# Patient Record
Sex: Female | Born: 1985 | Race: Black or African American | Hispanic: No | Marital: Single | Smoking: Former smoker
Health system: Southern US, Community
[De-identification: ages and names within clinical notes are randomized; demographics above are authoritative.]

## PROBLEM LIST (undated history)

## (undated) ENCOUNTER — Inpatient Hospital Stay (HOSPITAL_COMMUNITY): Payer: Self-pay

## (undated) DIAGNOSIS — G43909 Migraine, unspecified, not intractable, without status migrainosus: Secondary | ICD-10-CM

## (undated) DIAGNOSIS — D649 Anemia, unspecified: Secondary | ICD-10-CM

## (undated) DIAGNOSIS — Z8489 Family history of other specified conditions: Secondary | ICD-10-CM

## (undated) DIAGNOSIS — N200 Calculus of kidney: Secondary | ICD-10-CM

## (undated) DIAGNOSIS — O2303 Infections of kidney in pregnancy, third trimester: Secondary | ICD-10-CM

## (undated) DIAGNOSIS — N201 Calculus of ureter: Secondary | ICD-10-CM

## (undated) DIAGNOSIS — J45909 Unspecified asthma, uncomplicated: Secondary | ICD-10-CM

## (undated) DIAGNOSIS — E282 Polycystic ovarian syndrome: Secondary | ICD-10-CM

## (undated) DIAGNOSIS — I8390 Asymptomatic varicose veins of unspecified lower extremity: Secondary | ICD-10-CM

## (undated) DIAGNOSIS — Z87448 Personal history of other diseases of urinary system: Secondary | ICD-10-CM

## (undated) DIAGNOSIS — L659 Nonscarring hair loss, unspecified: Secondary | ICD-10-CM

## (undated) DIAGNOSIS — J302 Other seasonal allergic rhinitis: Secondary | ICD-10-CM

## (undated) DIAGNOSIS — Z87442 Personal history of urinary calculi: Secondary | ICD-10-CM

## (undated) DIAGNOSIS — Z87820 Personal history of traumatic brain injury: Secondary | ICD-10-CM

## (undated) DIAGNOSIS — R51 Headache: Secondary | ICD-10-CM

## (undated) DIAGNOSIS — R0600 Dyspnea, unspecified: Secondary | ICD-10-CM

## (undated) HISTORY — PX: WISDOM TOOTH EXTRACTION: SHX21

## (undated) HISTORY — PX: TONSILLECTOMY: SUR1361

## (undated) HISTORY — DX: Headache: R51

## (undated) HISTORY — DX: Other seasonal allergic rhinitis: J30.2

---
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
## 2004-03-13 ENCOUNTER — Emergency Department (HOSPITAL_COMMUNITY): Admission: EM | Admit: 2004-03-13 | Discharge: 2004-03-13 | Payer: Self-pay | Admitting: Emergency Medicine

## 2004-05-29 ENCOUNTER — Emergency Department (HOSPITAL_COMMUNITY): Admission: EM | Admit: 2004-05-29 | Discharge: 2004-05-29 | Payer: Self-pay | Admitting: Emergency Medicine

## 2004-06-22 ENCOUNTER — Ambulatory Visit (HOSPITAL_COMMUNITY): Admission: RE | Admit: 2004-06-22 | Discharge: 2004-06-22 | Payer: Self-pay | Admitting: Otolaryngology

## 2004-06-22 ENCOUNTER — Ambulatory Visit (HOSPITAL_BASED_OUTPATIENT_CLINIC_OR_DEPARTMENT_OTHER): Admission: RE | Admit: 2004-06-22 | Discharge: 2004-06-22 | Payer: Self-pay | Admitting: Otolaryngology

## 2004-06-22 ENCOUNTER — Encounter (INDEPENDENT_AMBULATORY_CARE_PROVIDER_SITE_OTHER): Payer: Self-pay | Admitting: Specialist

## 2004-06-24 ENCOUNTER — Emergency Department (HOSPITAL_COMMUNITY): Admission: EM | Admit: 2004-06-24 | Discharge: 2004-06-24 | Payer: Self-pay | Admitting: Emergency Medicine

## 2004-06-26 ENCOUNTER — Emergency Department (HOSPITAL_COMMUNITY): Admission: EM | Admit: 2004-06-26 | Discharge: 2004-06-26 | Payer: Self-pay | Admitting: Emergency Medicine

## 2004-06-29 ENCOUNTER — Emergency Department (HOSPITAL_COMMUNITY): Admission: EM | Admit: 2004-06-29 | Discharge: 2004-06-29 | Payer: Self-pay | Admitting: Emergency Medicine

## 2004-08-03 ENCOUNTER — Emergency Department (HOSPITAL_COMMUNITY): Admission: EM | Admit: 2004-08-03 | Discharge: 2004-08-03 | Payer: Self-pay | Admitting: Emergency Medicine

## 2004-08-23 ENCOUNTER — Emergency Department (HOSPITAL_COMMUNITY): Admission: EM | Admit: 2004-08-23 | Discharge: 2004-08-23 | Payer: Self-pay | Admitting: Emergency Medicine

## 2004-10-07 ENCOUNTER — Ambulatory Visit (HOSPITAL_COMMUNITY): Admission: RE | Admit: 2004-10-07 | Discharge: 2004-10-07 | Payer: Self-pay | Admitting: Obstetrics & Gynecology

## 2004-11-10 ENCOUNTER — Inpatient Hospital Stay (HOSPITAL_COMMUNITY): Admission: AD | Admit: 2004-11-10 | Discharge: 2004-11-10 | Payer: Self-pay | Admitting: Obstetrics & Gynecology

## 2004-12-30 ENCOUNTER — Ambulatory Visit (HOSPITAL_COMMUNITY): Admission: RE | Admit: 2004-12-30 | Discharge: 2004-12-30 | Payer: Self-pay | Admitting: Obstetrics & Gynecology

## 2004-12-31 ENCOUNTER — Inpatient Hospital Stay (HOSPITAL_COMMUNITY): Admission: AD | Admit: 2004-12-31 | Discharge: 2004-12-31 | Payer: Self-pay | Admitting: Obstetrics & Gynecology

## 2005-02-02 ENCOUNTER — Inpatient Hospital Stay (HOSPITAL_COMMUNITY): Admission: AD | Admit: 2005-02-02 | Discharge: 2005-02-03 | Payer: Self-pay | Admitting: Obstetrics

## 2005-02-12 ENCOUNTER — Inpatient Hospital Stay (HOSPITAL_COMMUNITY): Admission: AD | Admit: 2005-02-12 | Discharge: 2005-02-16 | Payer: Self-pay | Admitting: Obstetrics

## 2005-02-13 ENCOUNTER — Encounter (INDEPENDENT_AMBULATORY_CARE_PROVIDER_SITE_OTHER): Payer: Self-pay | Admitting: *Deleted

## 2005-07-13 ENCOUNTER — Inpatient Hospital Stay (HOSPITAL_COMMUNITY): Admission: AD | Admit: 2005-07-13 | Discharge: 2005-07-13 | Payer: Self-pay | Admitting: Obstetrics & Gynecology

## 2005-08-30 ENCOUNTER — Emergency Department (HOSPITAL_COMMUNITY): Admission: EM | Admit: 2005-08-30 | Discharge: 2005-08-30 | Payer: Self-pay | Admitting: Emergency Medicine

## 2005-09-01 ENCOUNTER — Inpatient Hospital Stay (HOSPITAL_COMMUNITY): Admission: AD | Admit: 2005-09-01 | Discharge: 2005-09-01 | Payer: Self-pay | Admitting: Obstetrics

## 2005-09-21 ENCOUNTER — Encounter: Admission: RE | Admit: 2005-09-21 | Discharge: 2005-09-21 | Payer: Self-pay | Admitting: Neurology

## 2005-10-08 ENCOUNTER — Ambulatory Visit (HOSPITAL_COMMUNITY): Admission: RE | Admit: 2005-10-08 | Discharge: 2005-10-08 | Payer: Self-pay | Admitting: Obstetrics

## 2005-10-24 ENCOUNTER — Inpatient Hospital Stay (HOSPITAL_COMMUNITY): Admission: AD | Admit: 2005-10-24 | Discharge: 2005-10-24 | Payer: Self-pay | Admitting: Obstetrics

## 2006-01-12 ENCOUNTER — Ambulatory Visit (HOSPITAL_COMMUNITY): Admission: RE | Admit: 2006-01-12 | Discharge: 2006-01-12 | Payer: Self-pay | Admitting: Obstetrics

## 2006-02-27 ENCOUNTER — Inpatient Hospital Stay (HOSPITAL_COMMUNITY): Admission: AD | Admit: 2006-02-27 | Discharge: 2006-03-01 | Payer: Self-pay | Admitting: Obstetrics & Gynecology

## 2006-04-01 ENCOUNTER — Inpatient Hospital Stay (HOSPITAL_COMMUNITY): Admission: AD | Admit: 2006-04-01 | Discharge: 2006-04-01 | Payer: Self-pay | Admitting: Obstetrics

## 2006-04-04 ENCOUNTER — Emergency Department (HOSPITAL_COMMUNITY): Admission: EM | Admit: 2006-04-04 | Discharge: 2006-04-05 | Payer: Self-pay | Admitting: Emergency Medicine

## 2006-12-03 ENCOUNTER — Emergency Department (HOSPITAL_COMMUNITY): Admission: EM | Admit: 2006-12-03 | Discharge: 2006-12-03 | Payer: Self-pay | Admitting: Emergency Medicine

## 2007-02-02 ENCOUNTER — Emergency Department (HOSPITAL_COMMUNITY): Admission: EM | Admit: 2007-02-02 | Discharge: 2007-02-02 | Payer: Self-pay | Admitting: Family Medicine

## 2007-07-06 ENCOUNTER — Emergency Department (HOSPITAL_COMMUNITY): Admission: EM | Admit: 2007-07-06 | Discharge: 2007-07-06 | Payer: Self-pay | Admitting: Emergency Medicine

## 2007-09-07 ENCOUNTER — Emergency Department (HOSPITAL_COMMUNITY): Admission: EM | Admit: 2007-09-07 | Discharge: 2007-09-07 | Payer: Self-pay | Admitting: Emergency Medicine

## 2007-09-08 ENCOUNTER — Emergency Department (HOSPITAL_COMMUNITY): Admission: EM | Admit: 2007-09-08 | Discharge: 2007-09-08 | Payer: Self-pay | Admitting: Emergency Medicine

## 2007-09-20 ENCOUNTER — Emergency Department (HOSPITAL_COMMUNITY): Admission: EM | Admit: 2007-09-20 | Discharge: 2007-09-20 | Payer: Self-pay | Admitting: Emergency Medicine

## 2007-09-23 ENCOUNTER — Inpatient Hospital Stay (HOSPITAL_COMMUNITY): Admission: EM | Admit: 2007-09-23 | Discharge: 2007-09-27 | Payer: Self-pay | Admitting: Emergency Medicine

## 2008-06-17 ENCOUNTER — Emergency Department (HOSPITAL_COMMUNITY): Admission: EM | Admit: 2008-06-17 | Discharge: 2008-06-17 | Payer: Self-pay | Admitting: Emergency Medicine

## 2008-06-19 ENCOUNTER — Observation Stay (HOSPITAL_COMMUNITY): Admission: EM | Admit: 2008-06-19 | Discharge: 2008-06-19 | Payer: Self-pay | Admitting: Emergency Medicine

## 2009-03-19 ENCOUNTER — Emergency Department (HOSPITAL_COMMUNITY): Admission: EM | Admit: 2009-03-19 | Discharge: 2009-03-19 | Payer: Self-pay | Admitting: Emergency Medicine

## 2009-04-24 ENCOUNTER — Emergency Department (HOSPITAL_COMMUNITY): Admission: EM | Admit: 2009-04-24 | Discharge: 2009-04-24 | Payer: Self-pay | Admitting: Emergency Medicine

## 2009-08-04 ENCOUNTER — Emergency Department (HOSPITAL_COMMUNITY): Admission: EM | Admit: 2009-08-04 | Discharge: 2009-08-04 | Payer: Self-pay | Admitting: Emergency Medicine

## 2009-09-23 ENCOUNTER — Emergency Department (HOSPITAL_COMMUNITY): Admission: EM | Admit: 2009-09-23 | Discharge: 2009-09-23 | Payer: Self-pay | Admitting: Emergency Medicine

## 2009-10-02 ENCOUNTER — Observation Stay (HOSPITAL_COMMUNITY): Admission: EM | Admit: 2009-10-02 | Discharge: 2009-10-02 | Payer: Self-pay | Admitting: Emergency Medicine

## 2010-02-22 ENCOUNTER — Emergency Department (HOSPITAL_COMMUNITY)
Admission: EM | Admit: 2010-02-22 | Discharge: 2010-02-22 | Payer: Self-pay | Source: Home / Self Care | Admitting: Emergency Medicine

## 2010-02-24 ENCOUNTER — Emergency Department (HOSPITAL_COMMUNITY)
Admission: EM | Admit: 2010-02-24 | Discharge: 2010-02-25 | Payer: Self-pay | Source: Home / Self Care | Admitting: Emergency Medicine

## 2010-02-27 ENCOUNTER — Emergency Department (HOSPITAL_COMMUNITY)
Admission: EM | Admit: 2010-02-27 | Discharge: 2010-02-27 | Payer: Self-pay | Source: Home / Self Care | Admitting: Emergency Medicine

## 2010-04-01 ENCOUNTER — Emergency Department (HOSPITAL_COMMUNITY)
Admission: EM | Admit: 2010-04-01 | Discharge: 2010-04-01 | Payer: Self-pay | Source: Home / Self Care | Admitting: Emergency Medicine

## 2010-04-01 ENCOUNTER — Encounter (INDEPENDENT_AMBULATORY_CARE_PROVIDER_SITE_OTHER): Payer: Self-pay | Admitting: Emergency Medicine

## 2010-04-02 ENCOUNTER — Ambulatory Visit (HOSPITAL_COMMUNITY)
Admission: RE | Admit: 2010-04-02 | Discharge: 2010-04-02 | Payer: Self-pay | Source: Home / Self Care | Attending: Obstetrics | Admitting: Obstetrics

## 2010-06-07 LAB — URINE MICROSCOPIC-ADD ON

## 2010-06-07 LAB — URINALYSIS, ROUTINE W REFLEX MICROSCOPIC
Bilirubin Urine: NEGATIVE
Hgb urine dipstick: NEGATIVE
Ketones, ur: NEGATIVE mg/dL
Nitrite: NEGATIVE
Specific Gravity, Urine: 1.023 (ref 1.005–1.030)
pH: 6 (ref 5.0–8.0)

## 2010-06-07 LAB — RAPID STREP SCREEN (MED CTR MEBANE ONLY): Streptococcus, Group A Screen (Direct): POSITIVE — AB

## 2010-06-07 LAB — GLUCOSE, CAPILLARY: Glucose-Capillary: 107 mg/dL — ABNORMAL HIGH (ref 70–99)

## 2010-06-07 LAB — POCT PREGNANCY, URINE: Preg Test, Ur: NEGATIVE

## 2010-06-08 LAB — URINALYSIS, ROUTINE W REFLEX MICROSCOPIC
Bilirubin Urine: NEGATIVE
Hgb urine dipstick: NEGATIVE
Ketones, ur: NEGATIVE mg/dL
Nitrite: NEGATIVE
Protein, ur: NEGATIVE mg/dL
Specific Gravity, Urine: 1.023 (ref 1.005–1.030)
Urobilinogen, UA: 1 mg/dL (ref 0.0–1.0)

## 2010-06-08 LAB — URINE CULTURE

## 2010-06-08 LAB — CBC
HCT: 39.9 % (ref 36.0–46.0)
Hemoglobin: 13 g/dL (ref 12.0–15.0)
RDW: 13.6 % (ref 11.5–15.5)
WBC: 6 10*3/uL (ref 4.0–10.5)

## 2010-06-08 LAB — DIFFERENTIAL
Basophils Absolute: 0 10*3/uL (ref 0.0–0.1)
Basophils Relative: 0 % (ref 0–1)
Eosinophils Absolute: 0.1 10*3/uL (ref 0.0–0.7)
Monocytes Absolute: 0.4 10*3/uL (ref 0.1–1.0)
Neutro Abs: 3.6 10*3/uL (ref 1.7–7.7)
Neutrophils Relative %: 59 % (ref 43–77)

## 2010-06-08 LAB — COMPREHENSIVE METABOLIC PANEL
ALT: 13 U/L (ref 0–35)
Albumin: 3.8 g/dL (ref 3.5–5.2)
Alkaline Phosphatase: 50 U/L (ref 39–117)
BUN: 11 mg/dL (ref 6–23)
Chloride: 105 mEq/L (ref 96–112)
Glucose, Bld: 101 mg/dL — ABNORMAL HIGH (ref 70–99)
Potassium: 4.4 mEq/L (ref 3.5–5.1)
Sodium: 138 mEq/L (ref 135–145)
Total Bilirubin: 0.6 mg/dL (ref 0.3–1.2)
Total Protein: 7.8 g/dL (ref 6.0–8.3)

## 2010-06-08 LAB — PREGNANCY, URINE: Preg Test, Ur: NEGATIVE

## 2010-07-02 LAB — URINALYSIS, ROUTINE W REFLEX MICROSCOPIC
Glucose, UA: NEGATIVE mg/dL
Glucose, UA: NEGATIVE mg/dL
Ketones, ur: NEGATIVE mg/dL
Protein, ur: NEGATIVE mg/dL
Protein, ur: NEGATIVE mg/dL
Specific Gravity, Urine: 1.016 (ref 1.005–1.030)
Urobilinogen, UA: 1 mg/dL (ref 0.0–1.0)
pH: 7 (ref 5.0–8.0)

## 2010-07-02 LAB — DIFFERENTIAL
Eosinophils Absolute: 0.2 10*3/uL (ref 0.0–0.7)
Eosinophils Relative: 3 % (ref 0–5)
Lymphocytes Relative: 25 % (ref 12–46)
Lymphs Abs: 1.6 10*3/uL (ref 0.7–4.0)
Monocytes Absolute: 0.4 10*3/uL (ref 0.1–1.0)
Monocytes Relative: 5 % (ref 3–12)

## 2010-07-02 LAB — POCT I-STAT, CHEM 8
BUN: 9 mg/dL (ref 6–23)
Chloride: 107 mEq/L (ref 96–112)
Potassium: 4.3 mEq/L (ref 3.5–5.1)
Sodium: 139 mEq/L (ref 135–145)
TCO2: 24 mmol/L (ref 0–100)

## 2010-07-02 LAB — COMPREHENSIVE METABOLIC PANEL
ALT: 11 U/L (ref 0–35)
AST: 16 U/L (ref 0–37)
Albumin: 3.4 g/dL — ABNORMAL LOW (ref 3.5–5.2)
CO2: 25 mEq/L (ref 19–32)
Calcium: 9.4 mg/dL (ref 8.4–10.5)
GFR calc Af Amer: >60 mL/min (ref >60)
GFR calc non Af Amer: >60 mL/min (ref >60)
Sodium: 140 mEq/L (ref 135–145)
Total Protein: 6.8 g/dL (ref 6.0–8.3)

## 2010-07-02 LAB — HEPATIC FUNCTION PANEL
Albumin: 3.6 g/dL (ref 3.5–5.2)
Total Protein: 7.5 g/dL (ref 6.0–8.3)

## 2010-07-02 LAB — CBC
HCT: 38.1 % (ref 36.0–46.0)
MCV: 85.3 fL (ref 78.0–100.0)
Platelets: 268 10*3/uL (ref 150–400)
RDW: 13.7 % (ref 11.5–15.5)

## 2010-07-02 LAB — URINE MICROSCOPIC-ADD ON

## 2010-07-02 LAB — POCT PREGNANCY, URINE: Preg Test, Ur: NEGATIVE

## 2010-07-02 LAB — LIPASE, BLOOD: Lipase: 13 U/L (ref 11–59)

## 2010-08-04 NOTE — H&P (Signed)
NAME:  [name], [name] NO.:  [id number]   MEDICAL RECORD NO.:  [id number]          PATIENT TYPE:  INP   LOCATION:  1509                         FACILITY:  [facility]   PHYSICIAN:  Altha Harm, MDDATE OF BIRTH:  [birth year]   DATE OF ADMISSION:  06/19/2008  DATE OF DISCHARGE:                              HISTORY & PHYSICAL   CHIEF COMPLAINT:  Nausea, vomiting, abdominal pain.   HISTORY OF PRESENT ILLNESS:  This is a 25 year old patient with a  history of genital herpes who presents to the emergency room for the  second day with complaints of nausea and vomiting.  Apparently, the  patient was seen in the emergency room yesterday and diagnosed with  urinary tract infection, confirmed by evaluation of her __________ which  shows 20-30 WBCs.  The patient was given a prescription for Levaquin and  had a response of nausea and vomiting to Levaquin.  She was subsequently  given another prescription for Keflex, but states that she was still  unable to keep the medication down.  The patient returned to the  emergency room and [facility] was asked by the emergency room to admit  the patient for failed outpatient therapy with nausea and vomiting.  The  patient reports a tactile fever on Sunday, but has had no fever since  then.  She states that her abdominal pain is suprapubic, nonradiating.  She also has some pain in the lower bank, but not in the flank area.  She has had no chest pain, no loss of consciousness, no seizure, no  dizziness, no cough, no diarrhea.  The patient reports a vaginal  discharge.  She is one week post her menses despite having a Mirena in.  She describes the vaginal discharge as brownish and odorous and  malodorous in nature.   PAST MEDICAL HISTORY:  1. Genital herpes.  2. Status post G3 P2.   FAMILY HISTORY:  According to the patient, not significant.   SOCIAL HISTORY:  The patient lives with her two children.  She works in  an office.  She  denies any tobacco, alcohol or drug use.   CURRENT MEDICATIONS:  Keflex 500 mg q.4 h given to her on yesterday.   ALLERGIES:  No known drug allergies.   PRIMARY CARE PHYSICIAN:  The patient does not have a primary care  physician.  Her OB/GYN is Dr. Clearance Coots.   REVIEW OF SYSTEMS:  Ten systems for evaluation, all systems were  negative except as noted in the HPI.   PHYSICAL EXAMINATION:  GENERAL:  The patient is a well-nourished, well-  developed female laying in bed in no acute distress.  VITAL SIGNS:  Temperature 98.3, heart rate 71, blood pressure 116/76,  respirations 20, O2 saturation 96% on room air.  HEENT:  Normocephalic, atraumatic.  Pupils equal, round and reactive to  light.  Extraocular movements intact.  Tympanic membranes are  translucent bilaterally with good landmarks.  Oropharynx is moist.  No  exudate, erythema or lesions are noted.  NECK:  Trachea is midline, no masses, no thyromegaly, no JVD, no carotid  bruits.  CHEST:  She has normal respiratory effort, decreased excursion  bilaterally.  No wheezes, rales or rhonchi noted.  Clear to  auscultation.  CARDIOVASCULAR:  Normal S1, S2.  No murmurs, rubs or gallops noted.  PMI  is nondisplaced.  No heaves or thrills on palpation.  ABDOMEN:  Normoactive bowel sounds.  Obese, soft, nontender,  nondistended.  No masses.  No hepatosplenomegaly.   ASSESSMENT/PLAN:  Urinary tract infection with failed outpatient  treatment due to intolerance of oral antibiotics.  The patient has had  no vomiting since she has been in the hospital.  I will try the patient  on ciprofloxacin.  If she tolerates it, the patient will be discharged  home on a 14-day course of ciprofloxacin.  The patient does give a  history of genital herpes and now gives a history of vaginal discharge.  I would treat the patient empirically for GC/chlamydia with Rocephin and  azithromycin.  The patient is advised to follow up with her primary GYN  for evaluation  of her Mirena and for evaluation of her genital herpes.  Ideally, if this patient has genital herpes, she should be on  suppressive therapy, particularly in light of a foreign object being  implanted in the vaginal vault.  However, I will defer her that decision  to her primary GYN and I will give the patient information on  suppressive therapy for genital herpes.  Will bring the patient in on an  observation basis.  If the patient tolerates diet and tolerates oral  antibiotics, she will be discharged home with a 14-day course of  antibiotics.      Altha Harm, MD  Electronically Signed     MAM/MEDQ  D:  06/19/2008  T:  06/19/2008  Job:  [id number]

## 2010-08-04 NOTE — Discharge Summary (Signed)
NAME:  [name], [name]              ACCOUNT NO.:  [id number]   MEDICAL RECORD NO.:  [id number]          PATIENT TYPE:  OBV   LOCATION:  1509                         FACILITY:  [facility]   PHYSICIAN:  Altha Harm, MDDATE OF BIRTH:  04-08-1985   DATE OF ADMISSION:  06/19/2008  DATE OF DISCHARGE:  06/19/2008                               DISCHARGE SUMMARY   __________  1. Acute pyelonephritis.  2. Vaginal discharge.  3. Mild dehydration, resolved.   DISCHARGE MEDICATIONS:  1. Ciprofloxacin 500 mg p.o. b.i.d. x14 days.  2. Phenergan 12.5 mg p.o. q.6 h. p.r.n.   CODE STATUS:  Full code.   ALLERGIES:  No known drug allergies.   CHIEF COMPLAINT:  Failed outpatient therapy with Keflex and Levaquin.  Patient was having nausea and vomiting.   HISTORY OF PRESENT ILLNESS:  Please refer to the H and P for details of  the HPI; however, in short, this is a patient who was diagnosed with  pyelonephritis 2 days ago in the emergency.  She was given Levaquin  which she filled and had nausea and vomiting associated with this use.  The patient was then called in a prescription for Keflex and continued  to have nausea and vomiting.  She presented to the emergency room in a  state of mild dehydration and inability to take in oral medications.   HOSPITAL COURSE:  The patient was admitted on an observation basis.  She  was given some fluid resuscitation and given a trial of diet that she  tolerated well.  The patient was also given oral ciprofloxacin here in  the hospital which she tolerated without any difficulty.   The patient was afebrile throughout her hospital course.  She has had no  elevation in her white blood cell count.  She has no CVA tenderness and  she is certainly a patient that can be treated in an ambulatory setting  for her pyelonephritis.  Thus, she is being sent home with ciprofloxacin  to complete 14 days.   In addition to her urinary tract infection, the patient also gives  a  history of vaginal discharge.  The patient has, by her admission, a  history of genital herpes and is not currently on suppressive therapy.  The patient is sexually active without protection.  In light of all  this, the patient is treated empirically for GC and chlamydia with a  Depo dose of Rocephin in addition to 1 gm of azithromycin.  The patient  does have a Mirena which is indwelling and I have advised the patient to  go back to her GYN to be evaluated for whether or not she should  continue to have the Mirena in, in a patient who has genital herpes.  The patient also reports that she has had a period since having the  Mirena in and this needs to be followed by her GYN, Dr. Clearance Coots.  She has  been given this instruction.  The patient was also given written  information on genital herpes and the indications for suppressive  therapy.  I will defer that to her GYN  to make decisions regarding  treatment of the genital herpes and the continued use of her Mirena in  the context of her menorrhagia.  At the time of discharge, the patient  was afebrile, vital signs were stable.  Patient tolerated her diet,  tolerated her oral antibiotic without any difficulty and while off of IV  fluids.  The patient was ambulatory without any needs for oxygen or pain  medication.   FOLLOWUP:  With her OB/GYN within a week.   DIETARY RESTRICTIONS:  None.   PHYSICAL RESTRICTIONS:  None.   Total time for discharge less than 30 minutes.      Altha Harm, MD  Electronically Signed     MAM/MEDQ  D:  06/19/2008  T:  06/19/2008  Job:  [id number]

## 2010-08-04 NOTE — H&P (Signed)
NAME:  ELONDA, GIULIANO NO.:  [id number]   MEDICAL RECORD NO.:  [id number]          PATIENT TYPE:  INP   LOCATION:  3011                         FACILITY:  [facility]   PHYSICIAN:  Antony Contras, MD     DATE OF BIRTH:  1986/01/26   DATE OF ADMISSION:  09/23/2007  DATE OF DISCHARGE:                              HISTORY & PHYSICAL   CHIEF COMPLAINT:  Throat pain.   HISTORY OF PRESENT ILLNESS:  The patient is a 25 year old African  American female who had 4 wisdom teeth removed 4 days prior to admission  by Dr. Graylon Gunning.  She developed bleeding from the left side on the  day after surgery and had to come to emergency department where pack was  placed in the bleeding site and she was given pain medication.  She did  well until last evening when she developed right-sided pain that  progressed making it difficult to swallow due to pain.  This morning,  she was unable to swallow because of pain.  She called Dr. Shea Evans who  wanted to prescribe antibiotics and since she was unable to swallow, he  recommended she come to the emergency department.  Currently, pain is  10/10 and is sharp.  It is worse with swallowing and is on the right  side.  She is not able to open her mouth fully and has a dry mouth.  She  also complains of ear pain in both sides.   PAST MEDICAL HISTORY:  Asthma.   PAST SURGICAL HISTORY:  Wisdom teeth removal and tonsillectomy.   MEDICATIONS:  None.   ALLERGIES:  No known drug allergies.   FAMILY HISTORY:  Noncontributory.   SOCIAL HISTORY:  The patient is a smoker, but denies drug abuse.  She is  an occasional alcohol drinker.   REVIEW OF SYSTEMS:  Negative except as listed above.   PHYSICAL EXAMINATION:  VITAL SIGNS:  Temperature 99.0, blood pressure  129/91, pulse 90, and respirations 16.  GENERAL:  The patient is in no acute distress and is cooperative, but  clearly uncomfortable.  VOICE:  The patient has a hot potato sounding voice.  EARS:  External ears are normal and external canals are patent with  intact tympanic membranes and aerated middle ear spaces.  EYES:  Extraocular movements are intact.  Pupils are equal, round, and  reactive to light.  NOSE:  Nose is normal externally as well as internally with patent nasal  passages.  ORAL CAVITY AND OROPHARYNX:  The patient has significant trismus and can  only open her mouth about 1 cm.  She has a pierced tongue.  There  appears to be edema posteriorly on both sides, but this could not be  well examined.  The lips and teeth are normal.  NECK:  The patient is tender in the right upper neck and angle of the  jaw region.  There is no edema or firmness in this area nor along the  submental region.  LYMPHATICS:  There are no palpable lymph nodes in the neck.  NEURO:  Cranial nerves II-XII grossly intact.  SALIVARY GLANDS:  Normal to palpation.  THYROID:  Thyroid is normal to palpation.   RADIOLOGIC EXAM:  A neck CT with IV contrast performed today was  personally reviewed.  This demonstrates edema of the right  parapharyngeal space with effacement of the right piriform sinus.  The  airway is widely patent.  There is no evidence of fluid collection in  the region.  There are a few enlarged lymph nodes on either side.   ASSESSMENT:  The patient is a 25 year old African American female with  right parapharyngeal infection without abscess formation following  wisdom teeth extraction.   PLAN:  The patient has received intravenous clindamycin in the emergency  department as well as Solu-Medrol and morphine.  The patient will be  admitted to the hospital for continued intravenous antibiotic therapy.  I prescribed clindamycin 900 mg every 8 hours.  I also ordered  intravenous and oral pain medication as well as IV hydration.  A CBC  will be checked.  If the patient progresses and improves, she will be  able to be changed over to oral medication when she can tolerate oral   intake.  If she does not improve or worsens, re-imaging will be  performed in 48-72 hours to rule out abscess formation.      Antony Contras, MD  Electronically Signed     DDB/MEDQ  D:  09/23/2007  T:  09/24/2007  Job:  [id number]

## 2010-08-07 NOTE — Discharge Summary (Signed)
[name], [name]              ACCOUNT NO.:  [id number]   MEDICAL RECORD NO.:  [id number]          PATIENT TYPE:  INP   LOCATION:  3011                         FACILITY:  [facility]   PHYSICIAN:  Antony Contras, MD     DATE OF BIRTH:  May 22, 1985   DATE OF ADMISSION:  09/23/2007  DATE OF DISCHARGE:  09/27/2007                               DISCHARGE SUMMARY   ADMISSION DIAGNOSES:  1. Throat pain.  2. Parapharyngeal cellulitis.   DISCHARGE DIAGNOSES:  1. Throat pain.  2. Parapharyngeal cellulitis.   PROCEDURE:  CT of the neck with contrast.   HISTORY OF PRESENT ILLNESS:  The patient is a 25 year old [place]  American female who underwent for wisdom teeth removal by Dr. Shea Evans on  Tuesday.  On the evening prior to admission, she developed right throat  pain and could not swallow on the morning of admission.  She came to  emergency department complaining of severe throat pain.  She was found  to have significant trismus and a CT scan performed in the emergency  department demonstrated an area of edema in the parapharyngeal space  without frank abscess.  She was admitted to the hospital for intravenous  antibiotics and hydration and pain control.   HOSPITAL COURSE:  The patient was given intravenous clindamycin and  hydration in the hospital as well as IV pain medication.  She had a  gradual improvement in symptoms.  On the third day of admission, she was  improving but was concerned about going home to two small children and  also had concerns about that a grandmother had died after a dental  procedure.  By the fifth day of admission, she was stable and had social  arrangements for taking care of the children as well as herself at home.  She was then discharged home.   DISCHARGE MEDICATIONS:  1. Clindamycin.  2. Vicodin.   DISCHARGE INSTRUCTIONS:  The patient is to continue an unrestricted diet  and increase activity slowly.  She is to follow up with Dr. Shea Evans.      Antony Contras, MD  Electronically Signed     DDB/MEDQ  D:  11/02/2007  T:  11/02/2007  Job:  [id number]

## 2010-08-07 NOTE — Op Note (Signed)
NAME:  [name], [name]              ACCOUNT NO.:  [id number]   MEDICAL RECORD NO.:  [id number]          PATIENT TYPE:  AMB   LOCATION:  DSC                          FACILITY:  [facility]   PHYSICIAN:  Jefry H. Pollyann Kennedy, MD     DATE OF BIRTH:  Nov 07, 1985   DATE OF PROCEDURE:  06/22/2004  DATE OF DISCHARGE:                                 OPERATIVE REPORT   PREOPERATIVE DIAGNOSIS:  Chronic tonsillitis.   POSTOPERATIVE DIAGNOSIS:  Chronic tonsillitis.   PROCEDURE:  Tonsillectomy.   SURGEON:  Jefry H. Pollyann Kennedy, MD.   ANESTHESIA:  General endotracheal anesthesia was used.   COMPLICATIONS:  None.   BLOOD LOSS:  Minimal.   FINDINGS:  Mildly to moderately enlarged tonsils with deep cryptic spaces  and significant fibrosis surrounding the capsule.   REFERRING PHYSICIAN:  Dr. Petra Kuba.   COMPLICATIONS:  None.   HISTORY:  This is 25 year old history of recurring and chronic tonsillar  pharyngitis. Risks, benefits, alternatives, complications of procedure  explained to the patient, who seemed to understand and agreed to surgery.   PROCEDURE:  The patient was taken to the operating room, placed the  operating table in supine position. Following induction of general  endotracheal anesthesia, the table was turned and the patient was draped in  standard fashion. Crowe-Davis mouth gag was inserted into the oral cavity,  used to retract the tongue and mandible and attached to the Mayo stand.  Inspection of the palate revealed no evidence of submucous cleft or  shortening the soft palate. Red rubber catheter was inserted into the right  side of the nose, withdrawn through the mouth and used to retract the soft  palate and uvula. The tonsillectomy was performed using electrocautery  dissection, carefully dissecting the relatively avascular plane between the  tonsil capsule and constrictor muscles. Spot cautery was used  as needed bilaterally in multiple spots to treat and prevent any further  bleeding. Tonsils were sent together for pathologic evaluation. Pharynx was  suctioned on secretions irrigated with saline solution and orogastric tube  use aspirate contents of the stomach. The patient was awakened, extubated,  transferred to recovery in stable condition.      JHR/MEDQ  D:  06/22/2004  T:  06/22/2004  Job:  [id number]   cc:   Eino Farber., M.D.  [address] [place]  [place] [address]  Fax: [phone]

## 2010-08-07 NOTE — Discharge Summary (Signed)
NAME:  [name], [name]              ACCOUNT NO.:  [id number]   MEDICAL RECORD NO.:  [id number]          PATIENT TYPE:  INP   LOCATION:  9107                          FACILITY:  [facility]   PHYSICIAN:  Charles A. Clearance Coots, M.D.DATE OF BIRTH:  08/09/1985   DATE OF ADMISSION:  02/12/2005  DATE OF DISCHARGE:  02/16/2005                                 DISCHARGE SUMMARY   ADMITTING DIAGNOSES:  1.  [redacted] weeks gestation.  2.  Spontaneous rupture of membranes.   DISCHARGE DIAGNOSES:  1.  [redacted] weeks gestation.  2.  Spontaneous rupture of membranes.  3.  Status post primary low transverse cesarean section for arrest of first      stage of labor (arrest of descent).  Delivered a viable female by low      transverse cesarean section at 66 on February 13, 2005.  Apgars of 9      at one minute, 9 at five minutes.  Weight of 6 pounds 10 ounces.  Mother      and infant discharged home in good condition.   REASON FOR ADMISSION:  A 25 year old black female G1.  Estimated date of  confinement of February 24, 2005.  Presented to [facility] with uterine  contractions and bloody show.  On examination the patient had gross rupture  of membranes and fluid was Nitrazine and fern positive.  She was therefore  admitted.   PAST MEDICAL HISTORY:   SURGERY:  Tonsils and adenoids.   ILLNESSES:  Asthma.   MEDICATIONS:  Prenatal vitamins and inhaler.   ALLERGIES:  No known drug allergies.   SOCIAL HISTORY:  Single.  Negative tobacco, alcohol, or recreational drug  use.   PHYSICAL EXAMINATION:  GENERAL:  Well-nourished, well-developed female in no  acute distress.  VITAL SIGNS:  Afebrile.  Vital signs are stable.  LUNGS:  Clear to auscultation bilaterally.  HEART:  Regular rate and rhythm.  ABDOMEN:  Gravid, nontender.  PELVIC:  Cervix 1 cm dilated, 100% effaced, vertex at a -2 station.   ADMISSION LABORATORIES:  Hemoglobin 11, hematocrit 33.9, white blood cell  count 10,200, platelets 308,000.  RPR was  nonreactive.   HOSPITAL COURSE:  Patient was admitted and started on Pitocin augmentation  labor.  Cervix remained unchanged for greater than eight hours of Pitocin  augmentation and there was no further descent of the vertex past a -2  station.  A decision was made to proceed with cesarean section delivery for  arrest of first stage of labor (arrest of descent).  Primary low transverse  cesarean section was performed without complications on February 13, 2005.  Postoperative course was uncomplicated.  Patient was discharged home on  postoperative day #3 in good condition.   DISCHARGE LABORATORIES:  Hemoglobin 8.7, hematocrit 26.9, white blood cell  count 13,800, platelets 267,000.   DISCHARGE DISPOSITION:   MEDICATIONS:  1.  Continue prenatal vitamins.  2.  Iron was prescribed for anemia.  Patient did have some excessive blood      loss during surgery which most likely accounts for her anemia.   Routine written instructions were given for  obstetrical discharge after  cesarean section.  Patient is to call office for a follow-up appointment in  two weeks.      Charles A. Clearance Coots, M.D.  Electronically Signed     CAH/MEDQ  D:  02/16/2005  T:  02/16/2005  Job:  [id number]

## 2010-08-07 NOTE — Consult Note (Signed)
NAME:  Caitlin, Howard              ACCOUNT NO.:  [id number]   MEDICAL RECORD NO.:  [id number]          PATIENT TYPE:  EMS   LOCATION:  [facility]                         FACILITY:  [facility]   PHYSICIAN:  Mobolaji B. Bakare, M.D.DATE OF BIRTH:  Dec 23, 1985   DATE OF CONSULTATION:  DATE OF DISCHARGE:  04/05/2006                                 CONSULTATION   CHIEF COMPLAINT:  Left-sided chest pain.   HISTORY OF THE PRESENTING COMPLAINT:  Ms. [name] is a 25 year old woman  who recently delivered in December 2007 of a 7-pound 8-ounce baby.  She  has been breast-feeding and 1 week ago she had bronchitis, which has  since resolved.  At that time, she developed left-sided chest pain and  this pain is aggravated by coughing and body movement, especially when  she lifts up her arm.  She was seen by OB/GYN and had been to [facility].  She was given analgesia which included Vicodin and Percocet;  despite this, she is not having enough improvement.  Hence, the patient  was sent to the emergency room for further evaluation.  She was brought  by EMS and en route, she was given nitroglycerin, which did not help her  pain at all; in fact, she stated that this worsened the pain.   The patient has since been evaluated in the emergency room with a CT  scan of the chest, which was negative for pulmonary embolism.  There was  no cardiopulmonary abnormality.  She had an EKG which was essentially  normal with a heart rate of 80 beats per minute.  The patient does not  have known significant cardiovascular risk factors.  We were asked to  evaluate in the emergency room by the ER physician.   PAST MEDICAL HISTORY:  1. Asthma.  2. Migraine headaches.  3. She had a normal vaginal delivery on the 9th of December 2007.   PAST SURGICAL HISTORY:  Cesarean section in November 2006.   CURRENT MEDICATIONS:  Percocet, Vicodin, albuterol p.r.n.   ALLERGIES:  No known drug allergies.   SOCIAL HISTORY:  She does  not drink alcohol or smoke cigarettes.  She  does not use any drugs.   REVIEW OF SYSTEMS:  She denies fevers or chills.  Cough has abated.  No  dizziness and no abdominal pain, nausea, vomiting or diarrhea.   PHYSICAL EXAMINATION:  INITIAL VITALS:  Temperature 97, blood pressure  113/63, pulse of 90, respiratory rate of 20, O2 SATs or 100% on room  air.  GENERAL:  The patient is awake, alert, oriented in time, place and  person.  HEENT:  Normocephalic, atraumatic head.  Pupils equal, round and  reactive to light.  Extraocular muscle movement intact.  NECK:  No elevated JVD.  No carotid bruits.  LUNGS:  Clear clinically to auscultation.  CV:  S1 and S2, regular.  No murmur, no gallop.  ABDOMEN:  Not distended, soft and nontender.  MUSCULOSKELETAL:  There is reproducible chest wall tenderness,  particularly over the sternum and parasternal region and also over the  left breast.  BREASTS:  Examination reveals that  there is some engorgement of the left  breast, but there is no increased warmth or significant tenderness to  suspect mastitis at this point.  CNS:  No focal neurologic deficit.   INITIAL LABORATORY DATA:  Cardiac markers at the point of care are  negative.  Creatinine 1.0, sodium 138, potassium 4.2, chloride 106,  glucose 81, BUN 9.  Hemoglobin 13.3, hematocrit 39, white cell count  5.6, platelets 408,000.  Liver function tests normal.  Urinalysis  normal.   CT scan of the chest:  No PE.  No acute cardiopulmonary disease.   ASSESSMENT AND PLAN:  Ms. [name] is a 25 year old lady who recently  delivered a baby on the 9th of December, 7 pounds 9 ounces.  She also  had recent bronchitis and now presenting with musculoskeletal chest wall  tenderness.  Extensive workup in the emergency room was negative for  pulmonary embolism, cardiac markers were negative and EKG was normal.  I  do believe that this pain is musculoskeletal in nature secondary to a  combination of recent  bronchitis and lifting her baby, which is not  unusual; in addition, she may be developing an impending mastitis.  I  have advised her to continue breast-feeding her baby and to follow up  with a breast-feeding specialist over at [facility] should she  develop continued breast engorgement and I have also advised her to use  ibuprofen 400 mg q.6 h. p.r.n. to control her pain and she should use  narcotic analgesics sparingly.  I noted that she has Vicodin and  Percocet at home.  She should use this sparingly to avoid the crossover  to breast milk.  She will follow up with her primary care  physician/obstetrician in 1 week.      Mobolaji B. Corky Downs, M.D.  Electronically Signed     MBB/MEDQ  D:  04/05/2006  T:  04/05/2006  Job:  [id number]

## 2010-08-07 NOTE — Op Note (Signed)
NAME:  [name], [name]              ACCOUNT NO.:  [id number]   MEDICAL RECORD NO.:  [id number]          PATIENT TYPE:  INP   LOCATION:  9107                          FACILITY:  [facility]   PHYSICIAN:  Charles A. Clearance Coots, M.D.DATE OF BIRTH:  October 04, 1985   DATE OF PROCEDURE:  02/13/2005  DATE OF DISCHARGE:                                 OPERATIVE REPORT   PREOPERATIVE DIAGNOSIS:  Arrest of first stage of labor.   POSTOPERATIVE DIAGNOSIS:  Arrest of first stage of labor.   PROCEDURE:  Primary low transverse cesarean section.   SURGEON:  Coral Ceo.   ANESTHESIA:  Epidural.   ESTIMATED BLOOD LOSS:  800 mL.   IV FLUIDS:  2000 mL.   URINE OUTPUT:  200 mL, clear.   COMPLICATIONS:  None. Foley to gravity.   FINDINGS:  Viable female at 10. Apgars 9 at one minute and 9 at five  minutes, weight of 6 pounds 10 ounces. Normal uterus, ovaries and fallopian  tubes   OPERATION:  The patient was brought to operating room, and after  satisfactory redosing of the epidural, the abdomen was prepped and draped in  the usual sterile fashion. Pfannenstiel skin incision was made with the  scalpel down to the fascia. Fascia was nicked in the midline, and the  fascial incision was extended to left and to right with curved Mayo  scissors. The superior and inferior fascial edges were taken off the rectus  muscle with both blunt sharp dissection. The rectus muscle was then sharply  and bluntly divided in the midline superiorly and inferiorly. The peritoneum  was entered digitally and was digitally extended to left and to right. The  bladder blade was positioned, and the vesicouterine fold of the peritoneum  was grasped with forceps and incised with Metzenbaum scissors and extended  to left and to right with Metzenbaum scissors. The bladder flap was then  bluntly developed, and the bladder blade was repositioned in front of the  urinary bladder, placing it well out of operative field. The uterus was  entered transversely in the lower uterine segment with a scalpel. The  uterine incision was then extended to left and to right with digital  traction. Amniotic fluid was whitish and purulent. The vertex was then  extended into the incision and delivered with the aid of fundal pressure  from the assistant. Infant's mouth and nose were suctioned with a suction  bulb, and the delivery was completed with the aid of fundal pressure from  the assistant. Cord was doubly clamped and cut, and the infant was handed to  the nursery staff. Cord blood was obtained, and the placenta was  spontaneously expelled from the uterine cavity intact. The endometrial  surface was thoroughly debrided with a lap sponge. Edges of the uterine  incision were grasped with ring forceps, and the uterus was closed with two  layers. First layer was closed with a continuous interlocking suture of 0  Monocryl. The second layer was closed with continuous imbricating suture of  0 Monocryl. Hemostasis was excellent. The pelvic cavity was thoroughly  irrigated with warm saline solution.  All clots were removed. The abdomen was  then closed as follows:  Peritoneum was closed with continuous suture of 2-0  Monocryl. Fascia was closed with a continuous suture of 0 Vicryl.  Subcutaneous tissue was thoroughly irrigated with warm saline solution. All  areas of subcutaneous bleeding were coagulated with the Bovie. Skin was then  closed with continuous subcuticular suture of 3-0 Monocryl. Sterile bandage  was applied to the incision closure. Surgical technician indicated that all  needle, sponge and instrument counts were correct. The patient tolerated the  procedure well and was transported to the recovery room in satisfactory  condition.      Charles A. Clearance Coots, M.D.  Electronically Signed     CAH/MEDQ  D:  02/13/2005  T:  02/13/2005  Job:  [id number]

## 2010-11-16 ENCOUNTER — Emergency Department (HOSPITAL_COMMUNITY)
Admission: EM | Admit: 2010-11-16 | Discharge: 2010-11-16 | Disposition: A | Discharge status: Home / Self Care | Payer: Medicaid Other | Attending: Emergency Medicine | Admitting: Emergency Medicine

## 2010-11-16 DIAGNOSIS — L03019 Cellulitis of unspecified finger: Secondary | ICD-10-CM | POA: Insufficient documentation

## 2010-11-16 DIAGNOSIS — G9050 Complex regional pain syndrome I, unspecified: Secondary | ICD-10-CM | POA: Insufficient documentation

## 2010-11-16 DIAGNOSIS — F17200 Nicotine dependence, unspecified, uncomplicated: Secondary | ICD-10-CM | POA: Insufficient documentation

## 2010-12-15 LAB — DIFFERENTIAL
Basophils Absolute: 0
Basophils Relative: 1
Lymphocytes Relative: 30
Monocytes Absolute: 0.5
Monocytes Relative: 10
Neutro Abs: 3.1
Neutrophils Relative %: 59

## 2010-12-15 LAB — COMPREHENSIVE METABOLIC PANEL
Albumin: 3.6
Alkaline Phosphatase: 60
BUN: 10
Creatinine, Ser: 0.79
Glucose, Bld: 84
Total Protein: 7.6

## 2010-12-15 LAB — POCT PREGNANCY, URINE
Operator id: 282201
Preg Test, Ur: NEGATIVE

## 2010-12-15 LAB — CBC
HCT: 37
Hemoglobin: 12.4
MCHC: 33.4
MCV: 81.9
Platelets: 295
RDW: 14.3

## 2010-12-17 LAB — DIFFERENTIAL
Basophils Absolute: 0
Basophils Absolute: 0
Basophils Relative: 0
Basophils Relative: 0
Eosinophils Absolute: 0.1
Eosinophils Absolute: 0.1
Eosinophils Absolute: 0
Eosinophils Absolute: 0
Eosinophils Absolute: 0
Eosinophils Relative: 2
Eosinophils Relative: 0
Eosinophils Relative: 0
Lymphocytes Relative: 20
Lymphocytes Relative: 25
Lymphocytes Relative: 7 — ABNORMAL LOW
Lymphs Abs: 0.5 — ABNORMAL LOW
Lymphs Abs: 0.7
Lymphs Abs: 1.5
Monocytes Absolute: 0.5
Monocytes Absolute: 0.6
Monocytes Relative: 10
Monocytes Relative: 2 — ABNORMAL LOW
Neutro Abs: 2.5
Neutrophils Relative %: 66
Neutrophils Relative %: 67
Neutrophils Relative %: 63
Neutrophils Relative %: 93 — ABNORMAL HIGH

## 2010-12-17 LAB — CBC
HCT: 36.8
HCT: 31 — ABNORMAL LOW
HCT: 33.5 — ABNORMAL LOW
HCT: 34.2 — ABNORMAL LOW
Hemoglobin: 11.9 — ABNORMAL LOW
Hemoglobin: 10.3 — ABNORMAL LOW
MCHC: 32.3
MCHC: 32.9
MCV: 82.8
MCV: 83.2
MCV: 83.6
MCV: 83.6
Platelets: 295
Platelets: 328
RBC: 4.4
RBC: 4.44
RBC: 4.13
RDW: 14.5
WBC: 3.7 — ABNORMAL LOW
WBC: 6
WBC: 9.7
WBC: 9.8

## 2010-12-17 LAB — URINALYSIS, ROUTINE W REFLEX MICROSCOPIC
Bilirubin Urine: NEGATIVE
Ketones, ur: NEGATIVE
Ketones, ur: NEGATIVE
Nitrite: NEGATIVE
Nitrite: NEGATIVE
Specific Gravity, Urine: 1.023
Specific Gravity, Urine: 1.027
Urobilinogen, UA: 1
Urobilinogen, UA: 1
pH: 6

## 2010-12-17 LAB — POCT PREGNANCY, URINE
Operator id: 231701
Operator id: 261601
Preg Test, Ur: NEGATIVE
Preg Test, Ur: NEGATIVE

## 2010-12-17 LAB — COMPREHENSIVE METABOLIC PANEL
ALT: 19
Alkaline Phosphatase: 59
BUN: 8
CO2: 25
Chloride: 109
GFR calc non Af Amer: >60
Glucose, Bld: 74
Potassium: 4
Sodium: 141
Total Bilirubin: 0.7
Total Protein: 6.8

## 2010-12-17 LAB — BASIC METABOLIC PANEL
Chloride: 107
Creatinine, Ser: 0.71
GFR calc Af Amer: >60
Potassium: 3.6
Sodium: 138

## 2010-12-17 LAB — LIPASE, BLOOD: Lipase: 15

## 2010-12-17 LAB — POCT I-STAT, CHEM 8
Chloride: 106
HCT: 38
Potassium: 3.7

## 2011-04-22 ENCOUNTER — Ambulatory Visit
Admission: RE | Admit: 2011-04-22 | Discharge: 2011-04-22 | Disposition: A | Discharge status: Home / Self Care | Payer: Medicaid Other | Source: Ambulatory Visit | Attending: Family Medicine | Admitting: Family Medicine

## 2011-04-22 ENCOUNTER — Other Ambulatory Visit: Payer: Self-pay | Admitting: Family Medicine

## 2011-04-22 DIAGNOSIS — J410 Simple chronic bronchitis: Secondary | ICD-10-CM

## 2011-06-15 ENCOUNTER — Telehealth: Payer: Self-pay | Admitting: Internal Medicine

## 2011-06-15 NOTE — Telephone Encounter (Signed)
Pt requests to be worked in this week.  Pt had an appt set for 3/27, but stated that this day/time is not conducive with her scheduling needs.  Scheduled pt for 4/17.  Pt stated that she needs something sooner, but that it must be in the afternoon.

## 2011-06-15 NOTE — Telephone Encounter (Signed)
Error.  Duplicate message.  [name] °- °

## 2011-06-15 NOTE — Telephone Encounter (Signed)
LMOMTCB x q. There are no earlier appts for a new consult with any provider.

## 2011-06-15 NOTE — Telephone Encounter (Signed)
Called, spoke with pt.  She is requesting an evening appt and does not care if it is with another provider.  She does want to come to the [place] office.  At this time, there are no evening appts prior to the one she has with MW on 4/17.  Pt is ok with keeping the appt scheduled on 4/17.  Advised she can call back to see if there have been any cancellations.  She verbalized understanding of this and voiced no further questions/concerns at this time.

## 2011-06-15 NOTE — Telephone Encounter (Signed)
No- this is not possible b/c he is not here tomorrow pm. If needs pm appt can reschedule her consult to the first available pm slot. LMTCB

## 2011-06-16 ENCOUNTER — Institutional Professional Consult (permissible substitution): Payer: Medicaid Other | Admitting: Internal Medicine

## 2011-07-07 ENCOUNTER — Other Ambulatory Visit (INDEPENDENT_AMBULATORY_CARE_PROVIDER_SITE_OTHER): Payer: Medicaid Other

## 2011-07-07 ENCOUNTER — Ambulatory Visit (INDEPENDENT_AMBULATORY_CARE_PROVIDER_SITE_OTHER): Payer: Medicaid Other | Admitting: Internal Medicine

## 2011-07-07 ENCOUNTER — Encounter: Payer: Self-pay | Admitting: Internal Medicine

## 2011-07-07 VITALS — BP 128/88 | HR 111 | Temp 98.2°F | Ht 63.5 in | Wt 250.8 lb

## 2011-07-07 DIAGNOSIS — R0600 Dyspnea, unspecified: Secondary | ICD-10-CM

## 2011-07-07 DIAGNOSIS — R0609 Other forms of dyspnea: Secondary | ICD-10-CM

## 2011-07-07 DIAGNOSIS — R0989 Other specified symptoms and signs involving the circulatory and respiratory systems: Secondary | ICD-10-CM

## 2011-07-07 DIAGNOSIS — R609 Edema, unspecified: Secondary | ICD-10-CM | POA: Insufficient documentation

## 2011-07-07 LAB — CBC WITH DIFFERENTIAL/PLATELET
Basophils Relative: 0.4 % (ref 0.0–3.0)
Eosinophils Relative: 1.9 % (ref 0.0–5.0)
MCV: 89.7 fl (ref 78.0–100.0)
Monocytes Absolute: 0.4 10*3/uL (ref 0.1–1.0)
Neutrophils Relative %: 75.1 % (ref 43.0–77.0)
RBC: 4.34 Mil/uL (ref 3.87–5.11)
WBC: 8.1 10*3/uL (ref 4.5–10.5)

## 2011-07-07 MED ORDER — TRAMADOL HCL 50 MG PO TABS: ORAL_TABLET | ORAL | Status: AC

## 2011-07-07 MED ORDER — FAMOTIDINE 20 MG PO TABS: ORAL_TABLET | ORAL | Status: DC

## 2011-07-07 MED ORDER — OMEPRAZOLE 40 MG PO CPDR: 40.0000 mg | DELAYED_RELEASE_CAPSULE | Freq: Every day | ORAL | Status: DC

## 2011-07-07 NOTE — Patient Instructions (Addendum)
Try prilosec 20mg  (omeprazole)  Take 30-60 min before first meal of the day and Pepcid 20 mg one bedtime until cough is completely gone for at least a week   Take delsym two tsp every 12 hours and supplement if needed with  tramadol 50 mg (called in) up to 1-2 every 4 hours to suppress the urge to cough. Swallowing water or using ice chips/non mint and menthol containing candies (such as lifesavers or sugarless jolly ranchers) are also effective.  You should rest your voice and avoid activities that you know make you cough.  Once you have eliminated the cough for 3 straight days try reducing the tramadol first,  then the delsym as tolerated.    GERD (REFLUX)  is an extremely common cause of respiratory symptoms, many times with no significant heartburn at all.    It can be treated with medication, but also with lifestyle changes including avoidance of late meals, excessive alcohol, smoking cessation, and avoid fatty foods, chocolate, peppermint, colas, red wine, and acidic juices such as orange juice.  NO MINT OR MENTHOL PRODUCTS SO NO COUGH DROPS  USE SUGARLESS CANDY INSTEAD (jolley ranchers or Stover's)  NO OIL BASED VITAMINS - use powdered substitutes.  Please see patient coordinator before you leave today  to schedule     Please schedule a follow up office visit in 4 weeks, sooner if needed with pft's on return. > if condition worsens you need to go to ER for a lung scan to make sure you don't have blood clots related to the use of your birth control device but I think this is very unlikely based on today's evaluation

## 2011-07-07 NOTE — Assessment & Plan Note (Signed)
-   07/07/2011  Walked RA x 3 laps @ 185 ft each stopped due to  End of study, no desat  I believe she has a form of   Upper airway cough syndrome, so named because it's frequently impossible to sort out how much is  CR/sinusitis with freq throat clearing (which can be related to primary GERD)   vs  causing  secondary (" extra esophageal")  GERD from wide swings in gastric pressure that occur with throat clearing, often  promoting self use of mint and menthol lozenges that reduce the lower esophageal sphincter tone and exacerbate the problem further in a cyclical fashion.   These are the same pts (now being labeled as having "irritable larynx syndrome" by some cough centers) who not infrequently have a history of having failed to tolerate ace inhibitors,  dry powder inhalers or biphosphonates or report having atypical reflux symptoms that don't respond to standard doses of PPI , and are easily confused as having aecopd or asthma flares by even experienced allergists/ pulmonologists.   No better on atrovent so will ask her to stop it and max rx for gerd/ cyclical coughing. See instructions for specific recommendations which were reviewed directly with the patient who was given a copy with highlighter outlining the key components.

## 2011-07-07 NOTE — Progress Notes (Addendum)
  Subjective:    Patient ID: [name], female    DOB: January 22, 1986, 26 y.o.   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 rec quit smoking quit June 17 2011 assoc with bad noct mostly dry cough esp after supper and one episode severe cough to point of passing out end of March.  Last labs ok at [facility] in January. Also has vomited but otherwise obvious hb or sinus symptoms. Sob minimally progressed since onset, mostly now on steps.   On bcp implanted Feb 2013 p onset of symptoms and vericose veins L > R and no recent change in leg swelling.   Sleeping ok without nocturnal  or early am exacerbation  of respiratory  c/o's or need for noct saba. Also denies any obvious fluctuation of symptoms with weather or environmental changes or other aggravating or alleviating factors except as outlined above    Review of Systems  Constitutional: Positive for unexpected weight change. Negative for fever.  HENT: Positive for sneezing. Negative for ear pain, nosebleeds, congestion, sore throat, rhinorrhea, trouble swallowing, dental problem, postnasal drip and sinus pressure.   Eyes: Positive for redness and itching.  Respiratory: Positive for cough, chest tightness, shortness of breath and wheezing.   Cardiovascular: Positive for chest pain and leg swelling. Negative for palpitations.  Gastrointestinal: Negative for nausea and vomiting.  Genitourinary: Negative for dysuria.  Musculoskeletal: Negative for joint swelling.  Skin: Negative for rash.  Neurological: Positive for headaches.  Hematological: Bruises/bleeds  easily.  Psychiatric/Behavioral: Negative for dysphoric mood. The patient is not nervous/anxious.        Objective:   Physical Exam Obese bf ambulatory and nad   Wt 250 07/07/2011  HEENT: nl dentition, turbinates, and orophanx. Nl external ear canals without cough reflex   NECK :  without JVD/Nodes/TM/ nl carotid upstrokes bilaterally   LUNGS: no acc muscle use, clear to A and P bilaterally without cough on insp or exp maneuvers   CV:  RRR  no s3 or murmur or increase in P2, no edema   ABD:  soft and nontender with nl excursion in the supine position. No bruits or organomegaly, bowel sounds nl  MS:  warm without deformities, calf tenderness, cyanosis or clubbing. vericose veins L > R calf, neg homan's, trace pitting on L only  SKIN: warm and dry without lesions    NEURO:  alert, approp, no deficits     cxr 04/22/11 Stable mild chronic central airway thickening consistent with  bronchitis. No acute process identified.     Assessment & Plan:

## 2011-07-07 NOTE — Assessment & Plan Note (Signed)
-   Venous dopplers ordered 07/07/2011 >>>  No evidence of dvt but she is at risk based on obesity and use of bcps so needs venous dopplers now bilaterally for baseline

## 2011-07-08 LAB — BASIC METABOLIC PANEL
CO2: 25 mEq/L (ref 19–32)
Chloride: 106 mEq/L (ref 96–112)
Creatinine, Ser: 0.8 mg/dL (ref 0.4–1.2)
Potassium: 3.7 mEq/L (ref 3.5–5.1)
Sodium: 139 mEq/L (ref 135–145)

## 2011-07-08 LAB — TSH: TSH: 1.11 u[IU]/mL (ref 0.35–5.50)

## 2011-07-13 NOTE — Progress Notes (Signed)
Quick Note:  Spoke with pt and notified of results per Dr. Wert. Pt verbalized understanding and denied any questions.  ______ 

## 2011-08-03 ENCOUNTER — Encounter (INDEPENDENT_AMBULATORY_CARE_PROVIDER_SITE_OTHER): Payer: Medicaid Other

## 2011-08-03 DIAGNOSIS — R069 Unspecified abnormalities of breathing: Secondary | ICD-10-CM

## 2011-08-03 DIAGNOSIS — R0609 Other forms of dyspnea: Secondary | ICD-10-CM

## 2011-08-03 DIAGNOSIS — R0600 Dyspnea, unspecified: Secondary | ICD-10-CM

## 2011-08-03 DIAGNOSIS — R0989 Other specified symptoms and signs involving the circulatory and respiratory systems: Secondary | ICD-10-CM

## 2011-08-04 ENCOUNTER — Ambulatory Visit (INDEPENDENT_AMBULATORY_CARE_PROVIDER_SITE_OTHER): Payer: Medicaid Other | Admitting: Internal Medicine

## 2011-08-04 ENCOUNTER — Encounter: Payer: Self-pay | Admitting: Internal Medicine

## 2011-08-04 DIAGNOSIS — R609 Edema, unspecified: Secondary | ICD-10-CM

## 2011-08-04 DIAGNOSIS — R0989 Other specified symptoms and signs involving the circulatory and respiratory systems: Secondary | ICD-10-CM

## 2011-08-04 DIAGNOSIS — R0609 Other forms of dyspnea: Secondary | ICD-10-CM

## 2011-08-04 DIAGNOSIS — J45909 Unspecified asthma, uncomplicated: Secondary | ICD-10-CM

## 2011-08-04 DIAGNOSIS — R0600 Dyspnea, unspecified: Secondary | ICD-10-CM

## 2011-08-04 LAB — PULMONARY FUNCTION TEST

## 2011-08-04 MED ORDER — TRAMADOL HCL 50 MG PO TABS: ORAL_TABLET | ORAL | Status: AC

## 2011-08-04 MED ORDER — PREDNISONE (PAK) 10 MG PO TABS: ORAL_TABLET | ORAL | Status: AC

## 2011-08-04 MED ORDER — MOMETASONE FURO-FORMOTEROL FUM 100-5 MCG/ACT IN AERO: 2.0000 | INHALATION_SPRAY | Freq: Two times a day (BID) | RESPIRATORY_TRACT | Status: DC

## 2011-08-04 NOTE — Patient Instructions (Signed)
Prednisone 10 mg take  4 each am x 2 days,   2 each am x 2 days,  1 each am x2days and stop   Dulera 100 Take 2 puffs first thing in am and then another 2 puffs about 12 hours later.   Try prilosec 20mg   Take 30-60 min before first meal of the day and Pepcid 20 mg one bedtime until cough is completely gone for at least a week without the need for cough suppression  I think of reflux for chronic cough like I do oxygen for fire (doesn't cause the fire but once you get the oxygen suppressed it usually goes away regardless of the exact cause).   Take delsym two tsp every 12 hours and supplement if needed with  tramadol 50 mg up to 2 every 4 hours to suppress the urge to cough. Swallowing water or using ice chips/non mint and menthol containing candies (such as lifesavers or sugarless jolly ranchers) are also effective.  You should rest your voice and avoid activities that you know make you cough.  Once you have eliminated the cough for 3 straight days try reducing the tramadol first,  then the delsym as tolerated.   Please schedule a follow up office visit in 4 weeks, sooner if needed

## 2011-08-04 NOTE — Progress Notes (Signed)
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 rec quit smoking quit June 17 2011 assoc with bad noct mostly dry cough esp after supper and one episode severe cough to point of passing out end of March.  Last labs ok at [facility] in January. Also has vomited but otherwise obvious hb or sinus symptoms. Sob minimally progressed since onset, mostly on steps on initial ov.  On bcp implanted Feb 2013 p onset of symptoms and vericose veins L > R and no recent change in leg swelling.  rec Try prilosec 40 mg  (omeprazole)  Take 30-60 min before first meal of the day and Pepcid 20 mg one bedtime until cough is completely gone for at least a week  Take delsym two tsp every 12 hours and supplement if needed with  tramadol 50 mg (called in) up to 1-2 every 4 hours to suppress the urge to cough. Swallowing water or using ice chips/non mint and menthol containing candies (such as lifesavers or sugarless jolly ranchers) are also effective.  You should rest your voice and avoid activities that you know make you cough. Once you have eliminated the cough for 3 straight days try reducing the tramadol first,  then the delsym as tolerated.   GERD  Diet  08/04/2011 f/u ov/[name] "some better" cc still coughing so hard she vomits but the noct component of the cough is much better.  Not completely compliant with any of the above recs but promises to do better now that no longer in school. Min mucous production, none purulent, and no overt HB or sinus complaints. Legs  still hurt but says  Neg venous dopplers ? dx'd with neuropathy?    Sleeping ok without nocturnal  or early am exacerbation  of respiratory  c/o's or need for noct saba. Also denies any obvious fluctuation of symptoms with weather or environmental changes or other aggravating or alleviating factors except as outlined above.  ROS  At present neg for  any significant sore throat, dysphagia, dental problems, itching, sneezing,  nasal congestion or excess/ purulent secretions, ear ache,   fever, chills, sweats, unintended wt loss, pleuritic or exertional cp, hemoptysis, palpitations, orthopnea pnd   Also denies presyncope, palpitations, heartburn, abdominal pain, anorexia, nausea, vomiting, diarrhea  or change in bowel or urinary habits, change in stools or urine, dysuria,hematuria,  rash, arthralgias, visual complaints, headache, numbness weakness or ataxia or problems with walking or coordination. No noted change in mood/affect or memory.                          Objective:   Physical Exam Obese bf ambulatory and nad   Wt 250 07/07/2011 > 252 08/04/2011   HEENT: nl dentition, turbinates, and orophanx. Nl external ear canals without cough reflex   NECK :  without JVD/Nodes/TM/ nl carotid upstrokes bilaterally   LUNGS: no acc muscle use, clear to A and P bilaterally without cough  on insp or exp maneuvers   CV:  RRR  no s3 or murmur or increase in P2, no edema   ABD:  soft and nontender with nl excursion in the supine position. No bruits or organomegaly, bowel sounds nl  MS:  warm without deformities, calf tenderness, cyanosis or clubbing. vericose veins L > R calf, neg homan's, trace pitting on L only        cxr 04/22/11 Stable mild chronic central airway thickening consistent with  bronchitis. No acute process identified.     Assessment & Plan:

## 2011-08-04 NOTE — Progress Notes (Signed)
PFT done today. 

## 2011-08-05 DIAGNOSIS — J45909 Unspecified asthma, uncomplicated: Secondary | ICD-10-CM | POA: Insufficient documentation

## 2011-08-05 NOTE — Assessment & Plan Note (Addendum)
-   PFT's 08/04/11 FEV1  1.94 (63%) ratio 74 and 26% better p B2,  DLCO 73 corrects to 131      - HFA 75% 08/04/11  Clearly has asthmatic component to her cough and sob and not clear to what extent gerd might be playing a role (assoc of asthma with obesity is at least partly gerd related in most cases but probably a lot more complicated than that.  Since cough is such a prominent part of her presentation need to keep in mind that Of the three most common causes of chronic cough, only one (GERD)  can actually cause the other two (asthma and post nasal drip syndrome)  and perpetuate the cylce of cough inducing airway trauma, inflammation, heightened sensitivity to reflux which is prompted by the cough itself via a cyclical mechanism.    This may partially respond to steroids and look like asthma and post nasal drainage but never erradicated completely unless the cough and the secondary reflux are eliminated, preferably both at the same time.  While not intuitively obvious, many patients with chronic low grade reflux do not cough until there is a secondary insult that disturbs the protective epithelial barrier and exposes sensitive nerve endings.  This can be viral or direct physical injury such as with an endotracheal tube.   The point is that once this occurs, it is difficult to eliminate using anything but a maximally effective acid suppression regimen at least in the short run, accompanied by an appropriate diet to address non acid GERD.   See instructions for specific recommendations which were reviewed directly with the patient who was given a copy with highlighter outlining the key components.

## 2011-08-05 NOTE — Assessment & Plan Note (Signed)
-   07/07/2011  Walked RA x 3 laps @ 185 ft each stopped due to  End of study, no desat  - ERV 66% by pft's 08/04/11  Obesity deconditioning definitely contributing.

## 2011-08-05 NOTE — Assessment & Plan Note (Signed)
-   Venous dopplers 08/03/11 > neg bilaterally  Suspect mostly venous insufficiency related to obesity.

## 2011-09-06 ENCOUNTER — Ambulatory Visit: Payer: Medicaid Other | Admitting: Internal Medicine

## 2011-09-30 ENCOUNTER — Encounter: Payer: Medicaid Other | Admitting: Internal Medicine

## 2011-09-30 NOTE — Progress Notes (Signed)
 This encounter was created in error - please disregard.

## 2011-10-04 ENCOUNTER — Ambulatory Visit: Payer: Medicaid Other | Admitting: Internal Medicine

## 2011-10-05 ENCOUNTER — Encounter: Payer: Self-pay | Admitting: Internal Medicine

## 2011-10-05 ENCOUNTER — Ambulatory Visit (INDEPENDENT_AMBULATORY_CARE_PROVIDER_SITE_OTHER): Payer: Medicaid Other | Admitting: Internal Medicine

## 2011-10-05 VITALS — BP 110/64 | HR 90 | Temp 98.1°F | Ht 63.5 in | Wt 250.0 lb

## 2011-10-05 DIAGNOSIS — J45909 Unspecified asthma, uncomplicated: Secondary | ICD-10-CM

## 2011-10-05 MED ORDER — FAMOTIDINE 20 MG PO TABS: ORAL_TABLET | ORAL | Status: DC

## 2011-10-05 MED ORDER — OMEPRAZOLE 40 MG PO CPDR: 40.0000 mg | DELAYED_RELEASE_CAPSULE | Freq: Every day | ORAL | Status: DC

## 2011-10-05 MED ORDER — PREDNISONE (PAK) 10 MG PO TABS: ORAL_TABLET | ORAL | Status: AC

## 2011-10-05 NOTE — Patient Instructions (Addendum)
Work on inhaler technique:  relax and gently blow all the way out then take a nice smooth deep breath back in, triggering the inhaler at same time you start breathing in.  Hold for up to 5 seconds if you can.  Rinse and gargle with water when done   If your mouth or throat starts to bother you,   I suggest you time the inhaler to your dental care and after using the inhaler(s) brush teeth and tongue with a baking soda containing toothpaste and when you rinse this out, gargle with it first to see if this helps your mouth and throat.    Prednisone 10 mg take  4 each am x 2 days,   2 each am x 2 days,  1 each am x2days and stop    Please schedule a follow up office visit in 4 weeks, sooner if needed with all inhalers in hand

## 2011-10-05 NOTE — Progress Notes (Signed)
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 rec quit smoking quit June 17 2011 assoc with bad noct mostly dry cough esp after supper and one episode severe cough to point of passing out end of March.  Last labs ok at [facility] in January. Also has vomited but otherwise obvious hb or sinus symptoms. Sob minimally progressed since onset, mostly on steps on initial ov.  On bcp implanted Feb 2013 p onset of symptoms and vericose veins L > R and no recent change in leg swelling.  rec Try prilosec 40 mg  (omeprazole)  Take 30-60 min before first meal of the day and Pepcid 20 mg one bedtime until cough is completely gone for at least a week  Take delsym two tsp every 12 hours and supplement if needed with  tramadol 50 mg (called in) up to 1-2 every 4 hours to suppress the urge to cough. Swallowing water or using ice chips/non mint and menthol containing candies (such as lifesavers or sugarless jolly ranchers) are also effective.  You should rest your voice and avoid activities that you know make you cough. Once you have eliminated the cough for 3 straight days try reducing the tramadol first,  then the delsym as tolerated.   GERD  Diet  08/04/2011 f/u ov/[name] "some better" cc still coughing so hard she vomits but the noct component of the cough is much better.  Not completely compliant with any of the above recs but promises to do better now that no longer in school. Min mucous production, none purulent, and no overt HB or sinus complaints. Legs  still hurt but says  Neg venous dopplers ? dx'd with neuropathy?  rec Prednisone 10 mg take  4 each am x 2 days,   2 each am x 2 days,  1 each am x2days and stop  Dulera 100 Take 2 puffs first thing in am and then another 2 puffs about 12 hours later.  Try prilosec 20mg   Take 30-60 min before first meal of the day and Pepcid 20 mg one bedtime until cough is completely gone for at least a week without the need for cough suppression Take delsym two tsp every 12 hours and supplement if needed with  tramadol 50 mg up to 2 every 4 hours to suppress the urge to cough   09/30/2011 f/u ov/[name] cc missed appt   10/05/2011 f/u ov/[name] cc worse sob/chest tightnes since ran out of meds sev weeks prior to OV    No unusual cough, purulent sputum or sinus/hb symptoms, using otc saba up to 4 x daily to control symptoms. Had been doing very well p last ov on rx       Still,sleeping ok without nocturnal  or early am exacerbation  of respiratory  c/o's or need for noct saba. Also denies any obvious fluctuation of symptoms with weather or environmental changes or other aggravating or alleviating factors except as outlined above.  ROS  At present neg for  any significant sore throat, dysphagia, dental problems, itching, sneezing,  nasal congestion or excess/ purulent secretions, ear ache,   fever, chills, sweats, unintended wt loss, pleuritic or exertional cp, hemoptysis, palpitations, orthopnea pnd   Also denies presyncope, palpitations, heartburn, abdominal pain, anorexia, nausea, vomiting, diarrhea  or change in bowel or urinary habits, change in stools or urine, dysuria,hematuria,  rash, arthralgias, visual complaints, headache, numbness weakness or ataxia or problems with walking or coordination. No noted change in mood/affect or memory.                          Objective:   Physical Exam Obese bf ambulatory and nad   Wt 250 07/07/2011 > 252 08/04/2011 > 10/05/2011  250  HEENT: nl dentition,  turbinates, and orophanx. Nl external ear canals without cough reflex   NECK :  without JVD/Nodes/TM/ nl carotid upstrokes bilaterally   LUNGS: no acc muscle use, clear to A and P bilaterally without cough on insp or exp maneuvers   CV:  RRR  no s3 or murmur or increase in P2, no edema   ABD:  soft and nontender with nl excursion in the supine position. No bruits or organomegaly, bowel sounds nl  MS:  warm without deformities, calf tenderness, cyanosis or clubbing. vericose veins L > R calf, neg homan's, trace pitting on L only        cxr 04/22/11 Stable mild chronic central airway thickening consistent with  bronchitis. No acute process identified.     Assessment & Plan:

## 2011-10-06 ENCOUNTER — Encounter: Payer: Self-pay | Admitting: Internal Medicine

## 2011-10-09 NOTE — Assessment & Plan Note (Addendum)
-   PFT's 08/04/11 FEV1  1.94 (63%) ratio 74 and 26% better p B2,  DLCO 73 corrects to 131      - HFA 75% 08/04/11  DDX of  difficult airways managment all start with A and  include Adherence, Ace Inhibitors, Acid Reflux, Active Sinus Disease, Alpha 1 Antitripsin deficiency, Anxiety masquerading as Airways dz,  ABPA,  allergy(esp in young), Aspiration (esp in elderly), Adverse effects of DPI,  Active smokers, plus two Bs  = Bronchiectasis and Beta blocker use..and one C= CHF Adherence is always the initial "prime suspect" and is a multilayered concern that requires a "trust but verify" approach in every patient - starting with knowing how to use medications, especially inhalers, correctly, keeping up with refills and understanding the fundamental difference between maintenance and prns vs those medications only taken for a very short course and then stopped and not refilled.   The proper method of use, as well as anticipated side effects, of a metered-dose inhaler are discussed and demonstrated to the patient. Improved effectiveness after extensive coaching during this visit to a level of approximately  90%  4 week samples of dulera given with plans to f/u @ 4 weeks   See instructions for specific recommendations which were reviewed directly with the patient who was given a copy with highlighter outlining the key components.

## 2011-11-02 ENCOUNTER — Ambulatory Visit: Payer: Medicaid Other | Admitting: Internal Medicine

## 2014-01-20 ENCOUNTER — Ambulatory Visit (HOSPITAL_BASED_OUTPATIENT_CLINIC_OR_DEPARTMENT_OTHER): Payer: Medicaid Other | Attending: Internal Medicine

## 2014-01-20 VITALS — Ht 64.0 in | Wt 247.0 lb

## 2014-01-20 DIAGNOSIS — G4730 Sleep apnea, unspecified: Secondary | ICD-10-CM | POA: Diagnosis present

## 2014-01-20 DIAGNOSIS — G4733 Obstructive sleep apnea (adult) (pediatric): Secondary | ICD-10-CM

## 2014-01-26 ENCOUNTER — Ambulatory Visit (HOSPITAL_BASED_OUTPATIENT_CLINIC_OR_DEPARTMENT_OTHER): Payer: Medicaid Other | Admitting: Internal Medicine

## 2014-01-26 DIAGNOSIS — G4733 Obstructive sleep apnea (adult) (pediatric): Secondary | ICD-10-CM

## 2014-01-26 NOTE — Sleep Study (Signed)
NAME: [name] DATE OF BIRTH:  1985-07-09 MEDICAL RECORD NUMBER [id number]  LOCATION: [facility]  PHYSICIAN: YOUNG,CLINTON D  DATE OF STUDY: 01/20/2014  SLEEP STUDY TYPE: Nocturnal Polysomnogram               REFERRING PHYSICIAN: Philis Fendt, MD  INDICATION FOR STUDY: hypersomnia with sleep apnea  EPWORTH SLEEPINESS SCORE:  15/24 HEIGHT: 5' 4" (162.6 cm)  WEIGHT: 247 lb (112.038 kg)    Body mass index is 42.38 kg/(m^2).  NECK SIZE: 16 in.  MEDICATIONS: charted for review   SLEEP ARCHITECTURE: total sleep time 371 minutes with sleep efficiency 91.6%. Stage I was 6.5%, stage II 72.8%, stage III 2.4%, REM 18.3% of total sleep time. Sleep latency 5.5 minutes, REM latency 78.5 minutes, awake after sleep onset 28.5 minutes, arousal index 5.5, bedtime medication: None  RESPIRATORY DATA: apnea hypopnea index (AHI) 0.0 per hour. No events met scoring criteria.  OXYGEN DATA: moderately loud snoring with oxygen desaturation to a nadir of 89% and mean saturation 92.6% on room air.  CARDIAC DATA: sinus rhythm  MOVEMENT/PARASOMNIA: no significant movement disturbance, no bathroom trips  IMPRESSION/ RECOMMENDATION:   1) Unremarkable sleep architecture for sleep center environment without medication 2) Normal respiratory pattern without respiratory sleep disturbance, AHI 0.0 per hour. Moderately loud snoring with oxygen desaturation to a nadir of 89% and mean saturation 92.6% on room air.   [place], American Board of Sleep Medicine  ELECTRONICALLY SIGNED ON:  01/26/2014, 11:22 AM [place] PH: [phone]   FX: [phone] [place]

## 2015-03-23 NOTE — L&D Delivery Note (Signed)
Delivery Note At 9:51 AM am viable female was delivered via  (Presentation:vertex ; LOA ).  APGAR:8 9, ; weight  .   Placenta status:spontaneous ,shultz .  Cord: 3VC with the following complications:none .  Cord pH: n/a  Anesthesia:  epidural Episiotomy:  none Lacerations:  none Suture Repair: n/a Est. Blood Loss (mL):  200  Mom to postpartum.  Baby to Couplet care / Skin to Skin.  [name] 11/16/2015, 10:01 AM

## 2015-04-25 ENCOUNTER — Encounter: Payer: Self-pay | Admitting: *Deleted

## 2015-04-25 DIAGNOSIS — Z3490 Encounter for supervision of normal pregnancy, unspecified, unspecified trimester: Secondary | ICD-10-CM

## 2015-05-06 ENCOUNTER — Ambulatory Visit (INDEPENDENT_AMBULATORY_CARE_PROVIDER_SITE_OTHER): Payer: Medicaid Other | Admitting: Obstetrics and Gynecology

## 2015-05-06 ENCOUNTER — Other Ambulatory Visit (HOSPITAL_COMMUNITY)
Admission: RE | Admit: 2015-05-06 | Discharge: 2015-05-06 | Disposition: A | Discharge status: Home / Self Care | Payer: Medicaid Other | Source: Ambulatory Visit | Attending: Obstetrics and Gynecology | Admitting: Obstetrics and Gynecology

## 2015-05-06 ENCOUNTER — Encounter: Payer: Self-pay | Admitting: Obstetrics and Gynecology

## 2015-05-06 VITALS — BP 109/44 | HR 79 | Temp 97.9°F | Wt 242.0 lb

## 2015-05-06 DIAGNOSIS — Z3492 Encounter for supervision of normal pregnancy, unspecified, second trimester: Secondary | ICD-10-CM

## 2015-05-06 DIAGNOSIS — Z6841 Body Mass Index (BMI) 40.0 and over, adult: Principal | ICD-10-CM

## 2015-05-06 DIAGNOSIS — O99211 Obesity complicating pregnancy, first trimester: Secondary | ICD-10-CM

## 2015-05-06 DIAGNOSIS — Z01419 Encounter for gynecological examination (general) (routine) without abnormal findings: Secondary | ICD-10-CM | POA: Diagnosis present

## 2015-05-06 DIAGNOSIS — Z23 Encounter for immunization: Secondary | ICD-10-CM

## 2015-05-06 DIAGNOSIS — Z124 Encounter for screening for malignant neoplasm of cervix: Secondary | ICD-10-CM

## 2015-05-06 DIAGNOSIS — Z113 Encounter for screening for infections with a predominantly sexual mode of transmission: Secondary | ICD-10-CM | POA: Diagnosis present

## 2015-05-06 DIAGNOSIS — Z1151 Encounter for screening for human papillomavirus (HPV): Secondary | ICD-10-CM | POA: Insufficient documentation

## 2015-05-06 DIAGNOSIS — Z98891 History of uterine scar from previous surgery: Secondary | ICD-10-CM | POA: Insufficient documentation

## 2015-05-06 LAB — POCT URINALYSIS DIP (DEVICE)
Bilirubin Urine: NEGATIVE
GLUCOSE, UA: NEGATIVE mg/dL
Hgb urine dipstick: NEGATIVE
Ketones, ur: NEGATIVE mg/dL
Nitrite: NEGATIVE
PH: 6.5 (ref 5.0–8.0)
PROTEIN: NEGATIVE mg/dL
SPECIFIC GRAVITY, URINE: 1.02 (ref 1.005–1.030)
UROBILINOGEN UA: 1 mg/dL (ref 0.0–1.0)

## 2015-05-06 LAB — OB RESULTS CONSOLE GC/CHLAMYDIA: Gonorrhea: NEGATIVE

## 2015-05-06 MED ORDER — FERROUS FUM-IRON POLYSACCH 162-115.2 MG PO CAPS: 1.0000 | ORAL_CAPSULE | Freq: Every day | ORAL | Status: DC

## 2015-05-06 NOTE — Progress Notes (Signed)
Subjective:    [name] is a [id number] [redacted]w[redacted]d being seen today for her first obstetrical visit.  Her obstetrical history is significant for previous C/S and VBAC. Patient does intend to breast feed. Pregnancy history fully reviewed.  Patient reports backache, no bleeding, no cramping and no leaking. Denies wheezing. Some SOB when walking up stairs.  Filed Vitals:   05/06/15 1344  BP: 109/44  Pulse: 79  Temp: 97.9 F (36.6 C)  Weight: 242 lb (109.77 kg)    HISTORY: OB History  Gravida Para Term Preterm AB SAB TAB Ectopic Multiple Living  0 0 0 0 0 0 2    # Outcome Date GA Lbr Len/2nd Weight Sex Delivery Anes PTL Lv  3 Current           2 Term 02/27/06 [redacted]w[redacted]d  7 lb 10 oz (3.459 kg) M VBAC EPI Y Y     Complications: Preterm labor  1 Term 02/13/05 [redacted]w[redacted]d  6 lb 9 oz (2.977 kg) F CS-Unspec EPI  Y     Complications: Failure to Progress in First Stage     Past Medical History  Diagnosis Date  . Asthma   . Seasonal allergies   . Headache(784.0)   . COPD (chronic obstructive pulmonary disease) [facility])    Past Surgical History  Procedure Laterality Date  . Tonsillectomy    . Wisdom tooth extraction    . Cesarean section     Family History  Problem Relation Age of Onset  . Adopted: Yes     Exam  DT FHR heard   Uterus:    12-14 wk size   Pelvic Exam:    Perineum: No Hemorrhoids, Normal Perineum   Vulva: normal   Vagina:  normal mucosa, normal discharge       Cervix: multiparous appearance, no bleeding following Pap and no lesions   Adnexa: not evaluated   Bony Pelvis: average  System: Breast:  normal appearance, no masses or tenderness   Skin: normal coloration and turgor, no rashes    Neurologic: oriented, normal, grossly non-focal   Extremities: normal strength, tone, and muscle mass, no deformities   HEENT PERRLA, neck supple with midline trachea and thyroid without masses   Mouth/Teeth mucous membranes moist, pharynx normal without lesions and dental  hygiene good   Neck supple   Cardiovascular: regular rate and rhythm, no murmurs or gallops   Respiratory:  appears well, vitals normal, no respiratory distress, acyanotic, normal RR, ear and throat exam is normal, neck free of mass or lymphadenopathy, chest clear, no wheezing, crepitations, rhonchi, normal symmetric air entry   Abdomen: soft, non-tender; bowel sounds normal; no masses,  no organomegaly   Urinary: urethral meatus normal      Assessment:    Pregnancy: [id number] Patient Active Problem List   Diagnosis Date Noted  . Previous cesarean section 05/06/2015  . Morbid obesity with BMI of 40.0-44.9, adult (HCC) 05/06/2015  . Supervision of low-risk pregnancy 04/25/2015  . Asthma 08/05/2011  . Dyspnea 07/07/2011  . Edema 07/07/2011    Doing well off all asthma meds since pregnancy dx.     Plan:     Initial labs drawn.  1 hr GCT Prenatal vitamins. Problem list reviewed and updated. Genetic Screening discussed First Screen: declined.  Ultrasound discussed; fetal survey: requested.  Follow up in 4 weeks. 50% of 30 min visit spent on counseling and coordination of care.  Pregnancy precautions reviewed.  F/U with PMD next month  re asthma   Emmanual Gauthreaux 05/06/2015

## 2015-05-06 NOTE — Patient Instructions (Signed)

## 2015-05-06 NOTE — Progress Notes (Deleted)
Patient reports lower back pain  

## 2015-05-07 LAB — PRENATAL PROFILE (SOLSTAS)
ANTIBODY SCREEN: NEGATIVE
BASOS ABS: 0 10*3/uL (ref 0.0–0.1)
Basophils Relative: 0 % (ref 0–1)
EOS ABS: 0.1 10*3/uL (ref 0.0–0.7)
EOS PCT: 1 % (ref 0–5)
HCT: 34 % — ABNORMAL LOW (ref 36.0–46.0)
HIV 1&2 Ab, 4th Generation: NONREACTIVE
Hemoglobin: 11.4 g/dL — ABNORMAL LOW (ref 12.0–15.0)
Hepatitis B Surface Ag: NEGATIVE
LYMPHS ABS: 2 10*3/uL (ref 0.7–4.0)
Lymphocytes Relative: 31 % (ref 12–46)
MCH: 29.1 pg (ref 26.0–34.0)
MCHC: 33.5 g/dL (ref 30.0–36.0)
MCV: 86.7 fL (ref 78.0–100.0)
MONO ABS: 0.4 10*3/uL (ref 0.1–1.0)
MONOS PCT: 7 % (ref 3–12)
MPV: 10.3 fL (ref 8.6–12.4)
Neutro Abs: 3.9 10*3/uL (ref 1.7–7.7)
Neutrophils Relative %: 61 % (ref 43–77)
PLATELETS: 253 10*3/uL (ref 150–400)
RBC: 3.92 MIL/uL (ref 3.87–5.11)
RDW: 12.9 % (ref 11.5–15.5)
RH TYPE: POSITIVE
RUBELLA: 2.52 {index} — AB (ref <0.90)
WBC: 6.4 10*3/uL (ref 4.0–10.5)

## 2015-05-07 LAB — GC/CHLAMYDIA PROBE AMP (~~LOC~~) NOT AT ARMC
Chlamydia: NEGATIVE
Neisseria Gonorrhea: NEGATIVE

## 2015-05-07 LAB — CYTOLOGY - PAP

## 2015-05-08 LAB — GLUCOSE TOLERANCE, 1 HOUR

## 2015-05-08 LAB — HEMOGLOBINOPATHY EVALUATION
HGB A: 97.1 % (ref 96.8–97.8)
HGB F QUANT: 0 % (ref 0.0–2.0)
HGB S QUANTITAION: 0 %
Hemoglobin Other: 0 %
Hgb A2 Quant: 2.9 % (ref 2.2–3.2)

## 2015-05-09 LAB — CULTURE, OB URINE: Colony Count: >=100000

## 2015-05-09 LAB — GLUCOSE TOLERANCE, 1 HOUR (50G) W/O FASTING: Glucose, 1 Hour GTT: 107 mg/dL (ref 70–140)

## 2015-05-11 LAB — CANNABANOIDS (GC/LC/MS), URINE: THC-COOH (GC/LC/MS), ur confirm: 51 ng/mL — AB (ref <5)

## 2015-05-12 ENCOUNTER — Telehealth: Payer: Self-pay | Admitting: General Practice

## 2015-05-12 DIAGNOSIS — Z3490 Encounter for supervision of normal pregnancy, unspecified, unspecified trimester: Secondary | ICD-10-CM

## 2015-05-12 MED ORDER — PRENATAL VITAMIN 27-0.8 MG PO TABS: 1.0000 | ORAL_TABLET | Freq: Every day | ORAL | Status: DC

## 2015-05-12 NOTE — Telephone Encounter (Signed)
Patient called and left message stating she was prescribed a PNV but her insurance won't cover it. Patient is calling to see if we can change to another vitamin. Called patient, no answer- left message stating we are trying to reach you to return your call and will send in another prescription for you. You may call us back at the clinics if you have other questions or concerns

## 2015-05-13 LAB — PRESCRIPTION MONITORING PROFILE (19 PANEL)
Amphetamine/Meth: NEGATIVE ng/mL
BENZODIAZEPINE SCREEN, URINE: NEGATIVE ng/mL
BUPRENORPHINE, URINE: NEGATIVE ng/mL
Barbiturate Screen, Urine: NEGATIVE ng/mL
COCAINE METABOLITES: NEGATIVE ng/mL
CREATININE, URINE: 215.64 mg/dL (ref >20.0)
Carisoprodol, Urine: NEGATIVE ng/mL
ECSTASY: NEGATIVE ng/mL
FENTANYL URINE: NEGATIVE ng/mL
METHADONE SCREEN, URINE: NEGATIVE ng/mL
METHAQUALONE SCREEN (URINE): NEGATIVE ng/mL
Meperidine, Ur: NEGATIVE ng/mL
NITRITES URINE, INITIAL: NEGATIVE ug/mL
Opiate Screen, Urine: NEGATIVE ng/mL
Oxycodone Screen, Ur: NEGATIVE ng/mL
PH URINE, INITIAL: 8.4 pH (ref 4.5–8.9)
PHENCYCLIDINE, UR: NEGATIVE ng/mL
Propoxyphene: NEGATIVE ng/mL
Tapentadol, urine: NEGATIVE ng/mL
Tramadol Scrn, Ur: NEGATIVE ng/mL
ZOLPIDEM, URINE: NEGATIVE ng/mL

## 2015-05-24 ENCOUNTER — Encounter: Payer: Self-pay | Admitting: Family Medicine

## 2015-05-24 DIAGNOSIS — R8271 Bacteriuria: Secondary | ICD-10-CM | POA: Insufficient documentation

## 2015-05-24 DIAGNOSIS — O9989 Other specified diseases and conditions complicating pregnancy, childbirth and the puerperium: Secondary | ICD-10-CM

## 2015-05-26 ENCOUNTER — Telehealth: Payer: Self-pay

## 2015-05-26 DIAGNOSIS — N390 Urinary tract infection, site not specified: Secondary | ICD-10-CM

## 2015-05-26 MED ORDER — CEPHALEXIN 500 MG PO CAPS: 500.0000 mg | ORAL_CAPSULE | Freq: Four times a day (QID) | ORAL | Status: DC

## 2015-05-26 NOTE — Telephone Encounter (Signed)
Patient called back into front office. Asked patient if she ever received treatment for UTI and she states no. Discussed antibiotic going to pharmacy for her to take to treat it. Patient verbalized understanding & asked about other results. Informed patient of other negative results. Patient verbalized understanding & had no other questions

## 2015-05-26 NOTE — Telephone Encounter (Signed)
I attempted to call this patient but no answer. I left a message to call us back regarding lab results.

## 2015-05-26 NOTE — Telephone Encounter (Signed)
-----   Message from Reva Boresanya S Pratt, MD sent at 05/24/2015  5:43 PM EST ----- Did patient get treated for UTI? If not please call in Keflex 500 mg qid x 7 days # 28, No RF

## 2015-06-03 ENCOUNTER — Ambulatory Visit (INDEPENDENT_AMBULATORY_CARE_PROVIDER_SITE_OTHER): Payer: Medicaid Other | Admitting: Family

## 2015-06-03 VITALS — BP 109/70 | HR 79 | Temp 98.3°F | Wt 248.8 lb

## 2015-06-03 DIAGNOSIS — J4520 Mild intermittent asthma, uncomplicated: Secondary | ICD-10-CM

## 2015-06-03 DIAGNOSIS — Z98891 History of uterine scar from previous surgery: Secondary | ICD-10-CM

## 2015-06-03 DIAGNOSIS — Z3492 Encounter for supervision of normal pregnancy, unspecified, second trimester: Secondary | ICD-10-CM | POA: Diagnosis not present

## 2015-06-03 LAB — POCT URINALYSIS DIP (DEVICE)
Bilirubin Urine: NEGATIVE
GLUCOSE, UA: NEGATIVE mg/dL
Hgb urine dipstick: NEGATIVE
Ketones, ur: NEGATIVE mg/dL
Leukocytes, UA: NEGATIVE
Nitrite: NEGATIVE
PH: 6 (ref 5.0–8.0)
PROTEIN: NEGATIVE mg/dL
SPECIFIC GRAVITY, URINE: 1.025 (ref 1.005–1.030)
UROBILINOGEN UA: 0.2 mg/dL (ref 0.0–1.0)

## 2015-06-03 MED ORDER — CYCLOBENZAPRINE HCL 10 MG PO TABS: 10.0000 mg | ORAL_TABLET | Freq: Three times a day (TID) | ORAL | Status: DC | PRN

## 2015-06-03 NOTE — Addendum Note (Signed)
Addended by: Marlis EdelsonKARIM, WALIDAH N on: 06/03/2015 12:09 PM   Modules accepted: Orders

## 2015-06-03 NOTE — Progress Notes (Signed)
Subjective:  [name] is a 30 y.o. G3P2002 at 3385w1d being seen today for ongoing prenatal care.  She is currently monitored for the following issues for this low-risk pregnancy and has Dyspnea; Edema; Asthma; Supervision of low-risk pregnancy; Previous cesarean section; Morbid obesity with BMI of 40.0-44.9, adult (HCC); and Asymptomatic bacteriuria, antepartum on her problem list.  Patient reports difficult sleeping at night.  No problems with asthma.  Taking Zyrtec and Flonase for allergies.   Contractions: Not present. Vag. Bleeding: None.  Movement: Present. Denies leaking of fluid.   The following portions of the patient's history were reviewed and updated as appropriate: allergies, current medications, past family history, past medical history, past social history, past surgical history and problem list. Problem list updated.  Objective:   Filed Vitals:   06/03/15 1111  BP: 109/70  Pulse: 79  Temp: 98.3 F (36.8 C)  Weight: 248 lb 12.8 oz (112.855 kg)    Fetal Status: Fetal Heart Rate (bpm): 158 Fundal Height: 18 cm Movement: Present     General:  Alert, oriented and cooperative. Patient is in no acute distress.  Skin: Skin is warm and dry. No rash noted.   Cardiovascular: Normal heart rate noted  Respiratory: Normal respiratory effort, no problems with respiration noted  Abdomen: Soft, gravid, appropriate for gestational age. Pain/Pressure: Absent     Pelvic: Vag. Bleeding: None     Cervical exam deferred        Extremities: Normal range of motion.  Edema: None  Mental Status: Normal mood and affect. Normal behavior. Normal judgment and thought content.   Urinalysis: Urine Protein: Negative Urine Glucose: Negative  Assessment and Plan:  Pregnancy: G3P2002 at 2785w1d  1. Supervision of low-risk pregnancy, second trimester - Ultrasound previously scheduled  2. Previous cesarean section - Given TOLAC consent to review  3. Asthma, mild intermittent, uncomplicated -  Discussed impact of allergy meds in prevention of asthma attack  General obstetric precautions including but not limited to vaginal bleeding and pelvic pain reviewed in detail with the patient. Please refer to After Visit Summary for other counseling recommendations.  Return in about 4 weeks (around 07/01/2015).   Eino FarberWalidah Kennith GainN Karim, CNM

## 2015-06-17 ENCOUNTER — Other Ambulatory Visit: Payer: Self-pay | Admitting: General Practice

## 2015-06-17 ENCOUNTER — Ambulatory Visit (HOSPITAL_COMMUNITY)
Admission: RE | Admit: 2015-06-17 | Discharge: 2015-06-17 | Disposition: A | Discharge status: Home / Self Care | Payer: Medicaid Other | Source: Ambulatory Visit | Attending: Obstetrics and Gynecology | Admitting: Obstetrics and Gynecology

## 2015-06-17 DIAGNOSIS — Z3689 Encounter for other specified antenatal screening: Secondary | ICD-10-CM

## 2015-06-17 DIAGNOSIS — Z36 Encounter for antenatal screening of mother: Secondary | ICD-10-CM | POA: Insufficient documentation

## 2015-06-17 DIAGNOSIS — Z6841 Body Mass Index (BMI) 40.0 and over, adult: Secondary | ICD-10-CM

## 2015-06-17 DIAGNOSIS — O34219 Maternal care for unspecified type scar from previous cesarean delivery: Secondary | ICD-10-CM

## 2015-06-17 DIAGNOSIS — Z3A19 19 weeks gestation of pregnancy: Secondary | ICD-10-CM

## 2015-06-17 DIAGNOSIS — O99212 Obesity complicating pregnancy, second trimester: Secondary | ICD-10-CM | POA: Diagnosis not present

## 2015-06-17 DIAGNOSIS — O34212 Maternal care for vertical scar from previous cesarean delivery: Secondary | ICD-10-CM | POA: Diagnosis not present

## 2015-07-01 ENCOUNTER — Ambulatory Visit (INDEPENDENT_AMBULATORY_CARE_PROVIDER_SITE_OTHER): Payer: Medicaid Other | Admitting: Family

## 2015-07-01 VITALS — BP 111/69 | HR 84 | Temp 98.7°F | Wt 248.1 lb

## 2015-07-01 DIAGNOSIS — Z3492 Encounter for supervision of normal pregnancy, unspecified, second trimester: Secondary | ICD-10-CM

## 2015-07-01 DIAGNOSIS — Z98891 History of uterine scar from previous surgery: Secondary | ICD-10-CM

## 2015-07-01 LAB — POCT URINALYSIS DIP (DEVICE)
Bilirubin Urine: NEGATIVE
Glucose, UA: NEGATIVE mg/dL
Hgb urine dipstick: NEGATIVE
Ketones, ur: NEGATIVE mg/dL
Nitrite: NEGATIVE
Protein, ur: NEGATIVE mg/dL
Specific Gravity, Urine: 1.02 (ref 1.005–1.030)
Urobilinogen, UA: 1 mg/dL (ref 0.0–1.0)
pH: 7 (ref 5.0–8.0)

## 2015-07-01 MED ORDER — CYCLOBENZAPRINE HCL 10 MG PO TABS: 10.0000 mg | ORAL_TABLET | Freq: Three times a day (TID) | ORAL | Status: DC | PRN

## 2015-07-01 NOTE — Progress Notes (Signed)
Subjective:  [name] is a 30 y.o. G3P2002 at 2585w1d being seen today for ongoing prenatal care.  She is currently monitored for the following issues for this low-risk pregnancy and has Dyspnea; Edema; Asthma; Supervision of low-risk pregnancy; Previous cesarean section; Morbid obesity with BMI of 40.0-44.9, adult (HCC); and Asymptomatic bacteriuria, antepartum on her problem list.  Patient reports no complaints.  Gender reveal with kicking of a football - boy!!!!  Contractions: Not present.  .  Movement: Present. Denies leaking of fluid.   The following portions of the patient's history were reviewed and updated as appropriate: allergies, current medications, past family history, past medical history, past social history, past surgical history and problem list. Problem list updated.  Objective:   Filed Vitals:   07/01/15 1046  BP: 111/69  Pulse: 84  Temp: 98.7 F (37.1 C)  Weight: 248 lb 1.6 oz (112.537 kg)    Fetal Status: Fetal Heart Rate (bpm): 156 Fundal Height: 22 cm Movement: Present     General:  Alert, oriented and cooperative. Patient is in no acute distress.  Skin: Skin is warm and dry. No rash noted.   Cardiovascular: Normal heart rate noted  Respiratory: Normal respiratory effort, no problems with respiration noted  Abdomen: Soft, gravid, appropriate for gestational age. Pain/Pressure: Absent     Pelvic:       Cervical exam deferred        Extremities: Normal range of motion.  Edema: None  Mental Status: Normal mood and affect. Normal behavior. Normal judgment and thought content.   Urinalysis: Urine Protein: Negative Urine Glucose: Negative  Assessment and Plan:  Pregnancy: G3P2002 at 9185w1d  1. Supervision of low-risk pregnancy, second trimester - Reviewed limited views on ultrasound; will rescan 6 wks - Refilled flexeril  2. Previous cesarean section - TOLAC consent signed today  Preterm labor symptoms and general obstetric precautions including but not  limited to vaginal bleeding, contractions, leaking of fluid and fetal movement were reviewed in detail with the patient. Please refer to After Visit Summary for other counseling recommendations.  Return in about 4 weeks (around 07/29/2015).   Eino FarberWalidah Kennith GainN Karim, CNM

## 2015-07-02 ENCOUNTER — Encounter: Payer: Self-pay | Admitting: *Deleted

## 2015-07-21 ENCOUNTER — Telehealth: Payer: Self-pay | Admitting: *Deleted

## 2015-07-21 NOTE — Telephone Encounter (Signed)
Patient called c/o finding a small blood clot in her mouth this morning. Did not cough it up, it was just in her mouth. She is having gum bleeding. After consult with Dr Jolayne Pantheronstant, advised patient to monitor the bleeding and if it continues to see her dentist. Patient voiced understanding.

## 2015-07-29 ENCOUNTER — Ambulatory Visit (INDEPENDENT_AMBULATORY_CARE_PROVIDER_SITE_OTHER): Payer: Medicaid Other | Admitting: Advanced Practice Midwife

## 2015-07-29 VITALS — BP 119/69 | HR 90 | Wt 255.2 lb

## 2015-07-29 DIAGNOSIS — IMO0002 Reserved for concepts with insufficient information to code with codable children: Secondary | ICD-10-CM

## 2015-07-29 DIAGNOSIS — O34219 Maternal care for unspecified type scar from previous cesarean delivery: Secondary | ICD-10-CM

## 2015-07-29 DIAGNOSIS — Z3492 Encounter for supervision of normal pregnancy, unspecified, second trimester: Secondary | ICD-10-CM

## 2015-07-29 DIAGNOSIS — Z0489 Encounter for examination and observation for other specified reasons: Secondary | ICD-10-CM

## 2015-07-29 DIAGNOSIS — Z98891 History of uterine scar from previous surgery: Secondary | ICD-10-CM

## 2015-07-29 NOTE — Progress Notes (Signed)
Subjective:  [name] is a 30 y.o. G3P2002 at 4673w1d being seen today for ongoing prenatal care.  She is currently monitored for the following issues for this low-risk pregnancy and has Dyspnea; Edema; Asthma; Supervision of low-risk pregnancy; Previous cesarean section; Morbid obesity with BMI of 40.0-44.9, adult (HCC); and Asymptomatic bacteriuria, antepartum on her problem list.  Patient reports no complaints.  Contractions: Not present. Vag. Bleeding: None.  Movement: Present. Denies leaking of fluid.   The following portions of the patient's history were reviewed and updated as appropriate: allergies, current medications, past family history, past medical history, past social history, past surgical history and problem list. Problem list updated.  Objective:   Filed Vitals:   07/29/15 1109  BP: 119/69  Pulse: 90  Weight: 255 lb 3.2 oz (115.758 kg)    Fetal Status: Fetal Heart Rate (bpm): 160 Fundal Height: 25 cm Movement: Present     General:  Alert, oriented and cooperative. Patient is in no acute distress.  Skin: Skin is warm and dry. No rash noted.   Cardiovascular: Normal heart rate noted  Respiratory: Normal respiratory effort, no problems with respiration noted  Abdomen: Soft, gravid, appropriate for gestational age. Pain/Pressure: Absent     Pelvic: Vag. Bleeding: None     Cervical exam deferred        Extremities: Normal range of motion.  Edema: None  Mental Status: Normal mood and affect. Normal behavior. Normal judgment and thought content.   Urinalysis:      Assessment and Plan:  Pregnancy: G3P2002 at 7273w1d  1. Evaluate anatomy not seen on prior sonogram  - US MFM OB FOLLOW UP; Future  2. Supervision of low-risk pregnancy, second trimester   3. Previous cesarean section --Successful VBAC x1, desires TOLAC. Consent signed and in chart.  Preterm labor symptoms and general obstetric precautions including but not limited to vaginal bleeding, contractions,  leaking of fluid and fetal movement were reviewed in detail with the patient. Please refer to After Visit Summary for other counseling recommendations.  Return in about 3 weeks (around 08/19/2015).   Hurshel PartyLisa A Leftwich-Kirby, CNM

## 2015-07-29 NOTE — Progress Notes (Signed)
28 week labs at next visit

## 2015-08-11 ENCOUNTER — Ambulatory Visit (HOSPITAL_COMMUNITY): Payer: Medicaid Other

## 2015-08-11 ENCOUNTER — Ambulatory Visit (HOSPITAL_COMMUNITY)
Admission: RE | Admit: 2015-08-11 | Discharge: 2015-08-11 | Disposition: A | Discharge status: Home / Self Care | Payer: Medicaid Other | Source: Ambulatory Visit | Attending: Advanced Practice Midwife | Admitting: Advanced Practice Midwife

## 2015-08-11 DIAGNOSIS — IMO0002 Reserved for concepts with insufficient information to code with codable children: Secondary | ICD-10-CM

## 2015-08-11 DIAGNOSIS — O99212 Obesity complicating pregnancy, second trimester: Secondary | ICD-10-CM | POA: Insufficient documentation

## 2015-08-11 DIAGNOSIS — Z3493 Encounter for supervision of normal pregnancy, unspecified, third trimester: Secondary | ICD-10-CM

## 2015-08-11 DIAGNOSIS — Z0489 Encounter for examination and observation for other specified reasons: Secondary | ICD-10-CM

## 2015-08-11 DIAGNOSIS — Z3A27 27 weeks gestation of pregnancy: Secondary | ICD-10-CM | POA: Diagnosis not present

## 2015-08-11 DIAGNOSIS — O34219 Maternal care for unspecified type scar from previous cesarean delivery: Secondary | ICD-10-CM | POA: Insufficient documentation

## 2015-08-20 ENCOUNTER — Ambulatory Visit (INDEPENDENT_AMBULATORY_CARE_PROVIDER_SITE_OTHER): Payer: Medicaid Other | Admitting: Medical

## 2015-08-20 VITALS — BP 111/66 | HR 92 | Wt 255.8 lb

## 2015-08-20 DIAGNOSIS — Z3689 Encounter for other specified antenatal screening: Secondary | ICD-10-CM

## 2015-08-20 DIAGNOSIS — Z3483 Encounter for supervision of other normal pregnancy, third trimester: Secondary | ICD-10-CM

## 2015-08-20 DIAGNOSIS — Z23 Encounter for immunization: Secondary | ICD-10-CM | POA: Diagnosis present

## 2015-08-20 DIAGNOSIS — Z3493 Encounter for supervision of normal pregnancy, unspecified, third trimester: Secondary | ICD-10-CM

## 2015-08-20 LAB — POCT URINALYSIS DIP (DEVICE)
BILIRUBIN URINE: NEGATIVE
GLUCOSE, UA: NEGATIVE mg/dL
KETONES UR: NEGATIVE mg/dL
NITRITE: NEGATIVE
PH: 6.5 (ref 5.0–8.0)
PROTEIN: NEGATIVE mg/dL
Specific Gravity, Urine: 1.02 (ref 1.005–1.030)
Urobilinogen, UA: 0.2 mg/dL (ref 0.0–1.0)

## 2015-08-20 MED ORDER — TETANUS-DIPHTH-ACELL PERTUSSIS 5-2.5-18.5 LF-MCG/0.5 IM SUSP
0.5000 mL | Freq: Once | INTRAMUSCULAR | Status: AC
  Administered 2015-08-20: 0.5 mL via INTRAMUSCULAR

## 2015-08-20 NOTE — Patient Instructions (Signed)
Fetal Movement Counts Patient Name: __________________________________________________ Patient Due Date: ____________________ Performing a fetal movement count is highly recommended in high-risk pregnancies, but it is good for every pregnant woman to do. Your health care provider may ask you to start counting fetal movements at 28 weeks of the pregnancy. Fetal movements often increase:  After eating a full meal.  After physical activity.  After eating or drinking something sweet or cold.  At rest. Pay attention to when you feel the baby is most active. This will help you notice a pattern of your baby's sleep and wake cycles and what factors contribute to an increase in fetal movement. It is important to perform a fetal movement count at the same time each day when your baby is normally most active.  HOW TO COUNT FETAL MOVEMENTS 1. Find a quiet and comfortable area to sit or lie down on your left side. Lying on your left side provides the best blood and oxygen circulation to your baby. 2. Write down the day and time on a sheet of paper or in a journal. 3. Start counting kicks, flutters, swishes, rolls, or jabs in a 2-hour period. You should feel at least 10 movements within 2 hours. 4. If you do not feel 10 movements in 2 hours, wait 2-3 hours and count again. Look for a change in the pattern or not enough counts in 2 hours. SEEK MEDICAL CARE IF:  You feel less than 10 counts in 2 hours, tried twice.  There is no movement in over an hour.  The pattern is changing or taking longer each day to reach 10 counts in 2 hours.  You feel the baby is not moving as he or she usually does. Date: ____________ Movements: ____________ Start time: ____________ Finish time: ____________  Date: ____________ Movements: ____________ Start time: ____________ Finish time: ____________ Date: ____________ Movements: ____________ Start time: ____________ Finish time: ____________ Date: ____________ Movements:  ____________ Start time: ____________ Finish time: ____________ Date: ____________ Movements: ____________ Start time: ____________ Finish time: ____________ Date: ____________ Movements: ____________ Start time: ____________ Finish time: ____________ Date: ____________ Movements: ____________ Start time: ____________ Finish time: ____________ Date: ____________ Movements: ____________ Start time: ____________ Finish time: ____________  Date: ____________ Movements: ____________ Start time: ____________ Finish time: ____________ Date: ____________ Movements: ____________ Start time: ____________ Finish time: ____________ Date: ____________ Movements: ____________ Start time: ____________ Finish time: ____________ Date: ____________ Movements: ____________ Start time: ____________ Finish time: ____________ Date: ____________ Movements: ____________ Start time: ____________ Finish time: ____________ Date: ____________ Movements: ____________ Start time: ____________ Finish time: ____________ Date: ____________ Movements: ____________ Start time: ____________ Finish time: ____________  Date: ____________ Movements: ____________ Start time: ____________ Finish time: ____________ Date: ____________ Movements: ____________ Start time: ____________ Finish time: ____________ Date: ____________ Movements: ____________ Start time: ____________ Finish time: ____________ Date: ____________ Movements: ____________ Start time: ____________ Finish time: ____________ Date: ____________ Movements: ____________ Start time: ____________ Finish time: ____________ Date: ____________ Movements: ____________ Start time: ____________ Finish time: ____________ Date: ____________ Movements: ____________ Start time: ____________ Finish time: ____________  Date: ____________ Movements: ____________ Start time: ____________ Finish time: ____________ Date: ____________ Movements: ____________ Start time: ____________ Finish  time: ____________ Date: ____________ Movements: ____________ Start time: ____________ Finish time: ____________ Date: ____________ Movements: ____________ Start time: ____________ Finish time: ____________ Date: ____________ Movements: ____________ Start time: ____________ Finish time: ____________ Date: ____________ Movements: ____________ Start time: ____________ Finish time: ____________ Date: ____________ Movements: ____________ Start time: ____________ Finish time: ____________  Date: ____________ Movements: ____________ Start time: ____________ Finish   time: ____________ Date: ____________ Movements: ____________ Start time: ____________ Finish time: ____________ Date: ____________ Movements: ____________ Start time: ____________ Finish time: ____________ Date: ____________ Movements: ____________ Start time: ____________ Finish time: ____________ Date: ____________ Movements: ____________ Start time: ____________ Finish time: ____________ Date: ____________ Movements: ____________ Start time: ____________ Finish time: ____________ Date: ____________ Movements: ____________ Start time: ____________ Finish time: ____________  Date: ____________ Movements: ____________ Start time: ____________ Finish time: ____________ Date: ____________ Movements: ____________ Start time: ____________ Finish time: ____________ Date: ____________ Movements: ____________ Start time: ____________ Finish time: ____________ Date: ____________ Movements: ____________ Start time: ____________ Finish time: ____________ Date: ____________ Movements: ____________ Start time: ____________ Finish time: ____________ Date: ____________ Movements: ____________ Start time: ____________ Finish time: ____________ Date: ____________ Movements: ____________ Start time: ____________ Finish time: ____________  Date: ____________ Movements: ____________ Start time: ____________ Finish time: ____________ Date: ____________  Movements: ____________ Start time: ____________ Finish time: ____________ Date: ____________ Movements: ____________ Start time: ____________ Finish time: ____________ Date: ____________ Movements: ____________ Start time: ____________ Finish time: ____________ Date: ____________ Movements: ____________ Start time: ____________ Finish time: ____________ Date: ____________ Movements: ____________ Start time: ____________ Finish time: ____________ Date: ____________ Movements: ____________ Start time: ____________ Finish time: ____________  Date: ____________ Movements: ____________ Start time: ____________ Finish time: ____________ Date: ____________ Movements: ____________ Start time: ____________ Finish time: ____________ Date: ____________ Movements: ____________ Start time: ____________ Finish time: ____________ Date: ____________ Movements: ____________ Start time: ____________ Finish time: ____________ Date: ____________ Movements: ____________ Start time: ____________ Finish time: ____________ Date: ____________ Movements: ____________ Start time: ____________ Finish time: ____________   This information is not intended to replace advice given to you by your health care provider. Make sure you discuss any questions you have with your health care provider.   Document Released: 04/07/2006 Document Revised: 03/29/2014 Document Reviewed: 01/03/2012 Elsevier Interactive Patient Education 2016 [employer] Third Trimester of Pregnancy The third trimester is from week 29 through week 42, months 7 through 9. This trimester is when your unborn baby (fetus) is growing very fast. At the end of the ninth month, the unborn baby is about 20 inches in length. It weighs about 6-10 pounds.  HOME CARE   Avoid all smoking, herbs, and alcohol. Avoid drugs not approved by your doctor.  Do not use any tobacco products, including cigarettes, chewing tobacco, and electronic cigarettes. If you need help  quitting, ask your doctor. You may get counseling or other support to help you quit.  Only take medicine as told by your doctor. Some medicines are safe and some are not during pregnancy.  Exercise only as told by your doctor. Stop exercising if you start having cramps.  Eat regular, healthy meals.  Wear a good support bra if your breasts are tender.  Do not use hot tubs, steam rooms, or saunas.  Wear your seat belt when driving.  Avoid raw meat, uncooked cheese, and liter boxes and soil used by cats.  Take your prenatal vitamins.  Take 1500-2000 milligrams of calcium daily starting at the 20th week of pregnancy until you deliver your baby.  Try taking medicine that helps you poop (stool softener) as needed, and if your doctor approves. Eat more fiber by eating fresh fruit, vegetables, and whole grains. Drink enough fluids to keep your pee (urine) clear or pale yellow.  Take warm water baths (sitz baths) to soothe pain or discomfort caused by hemorrhoids. Use hemorrhoid cream if your doctor approves.  If you have puffy, bulging veins (varicose veins), wear support hose. Raise (elevate) your   feet for 15 minutes, 3-4 times a day. Limit salt in your diet.  Avoid heavy lifting, wear low heels, and sit up straight.  Rest with your legs raised if you have leg cramps or low back pain.  Visit your dentist if you have not gone during your pregnancy. Use a soft toothbrush to brush your teeth. Be gentle when you floss.  You can have sex (intercourse) unless your doctor tells you not to.  Do not travel far distances unless you must. Only do so with your doctor's approval.  Take prenatal classes.  Practice driving to the hospital.  Pack your hospital bag.  Prepare the baby's room.  Go to your doctor visits. GET HELP IF: 5. You are not sure if you are in labor or if your water has broken. 6. You are dizzy. 7. You have mild cramps or pressure in your lower belly (abdominal). 8. You  have a nagging pain in your belly area. 9. You continue to feel sick to your stomach (nauseous), throw up (vomit), or have watery poop (diarrhea). 10. You have bad smelling fluid coming from your vagina. 11. You have pain with peeing (urination). GET HELP RIGHT AWAY IF:   You have a fever.  You are leaking fluid from your vagina.  You are spotting or bleeding from your vagina.  You have severe belly cramping or pain.  You lose or gain weight rapidly.  You have trouble catching your breath and have chest pain.  You notice sudden or extreme puffiness (swelling) of your face, hands, ankles, feet, or legs.  You have not felt the baby move in over an hour.  You have severe headaches that do not go away with medicine.  You have vision changes.   This information is not intended to replace advice given to you by your health care provider. Make sure you discuss any questions you have with your health care provider.   Document Released: 06/02/2009 Document Revised: 03/29/2014 Document Reviewed: 05/09/2012 Elsevier Interactive Patient Education 2016 Elsevier Inc[employer]  

## 2015-08-20 NOTE — Progress Notes (Signed)
Subjective:  [name] is a 30 y.o. G3P2002 at 8759w2d being seen today for ongoing prenatal care.  She is currently monitored for the following issues for this low-risk pregnancy and has Dyspnea; Edema; Asthma; Supervision of low-risk pregnancy; Previous cesarean section; Morbid obesity with BMI of 40.0-44.9, adult (HCC); and Asymptomatic bacteriuria, antepartum on her problem list.  Patient reports no complaints.  Contractions: Not present. Vag. Bleeding: None.  Movement: Present. Denies leaking of fluid.   The following portions of the patient's history were reviewed and updated as appropriate: allergies, current medications, past family history, past medical history, past social history, past surgical history and problem list. Problem list updated.  Objective:   Filed Vitals:   08/20/15 0848  BP: 111/66  Pulse: 92  Weight: 255 lb 12.8 oz (116.03 kg)    Fetal Status: Fetal Heart Rate (bpm): 154   Movement: Present     General:  Alert, oriented and cooperative. Patient is in no acute distress.  Skin: Skin is warm and dry. No rash noted.   Cardiovascular: Normal heart rate noted  Respiratory: Normal respiratory effort, no problems with respiration noted  Abdomen: Soft, gravid, appropriate for gestational age. Pain/Pressure: Absent     Pelvic: Vag. Bleeding: None     Cervical exam deferred        Extremities: Normal range of motion.  Edema: None  Mental Status: Normal mood and affect. Normal behavior. Normal judgment and thought content.   Urinalysis: Urine Protein: Negative Urine Glucose: Negative  Assessment and Plan:  Pregnancy: G3P2002 at 659w2d  1. Supervision of normal pregnancy in third trimester - Glucose Tolerance, 1 HR (50g) - RPR - HIV antibody - CBC - Tdap vaccine greater than or equal to 7yo IM - US MFM OB FOLLOW UP; scheduled  2. Ultrasound scan to check interval growth of fetus - US MFM OB FOLLOW UP; Future  Preterm labor symptoms and general obstetric  precautions including but not limited to vaginal bleeding, contractions, leaking of fluid and fetal movement were reviewed in detail with the patient. Please refer to After Visit Summary for other counseling recommendations.  Return in about 2 weeks (around 09/03/2015) for ROB.   Marny LowensteinJulie N Wenzel, PA-C

## 2015-08-21 LAB — CBC
HEMATOCRIT: 34.4 % — AB (ref 35.0–45.0)
HEMOGLOBIN: 10.9 g/dL — AB (ref 11.7–15.5)
MCH: 28.3 pg (ref 27.0–33.0)
MCHC: 31.7 g/dL — AB (ref 32.0–36.0)
MCV: 89.4 fL (ref 80.0–100.0)
MPV: 10.3 fL (ref 7.5–12.5)
Platelets: 245 10*3/uL (ref 140–400)
RBC: 3.85 MIL/uL (ref 3.80–5.10)
RDW: 14.2 % (ref 11.0–15.0)
WBC: 5.7 10*3/uL (ref 3.8–10.8)

## 2015-08-21 LAB — HIV ANTIBODY (ROUTINE TESTING W REFLEX): HIV 1&2 Ab, 4th Generation: NONREACTIVE

## 2015-08-21 LAB — GLUCOSE TOLERANCE, 1 HOUR (50G) W/O FASTING: GLUCOSE, 1 HR, GESTATIONAL: 108 mg/dL (ref <140)

## 2015-08-21 LAB — RPR

## 2015-08-29 ENCOUNTER — Inpatient Hospital Stay (HOSPITAL_COMMUNITY): Payer: Medicaid Other

## 2015-08-29 ENCOUNTER — Encounter (HOSPITAL_COMMUNITY): Payer: Self-pay | Admitting: *Deleted

## 2015-08-29 ENCOUNTER — Inpatient Hospital Stay (HOSPITAL_COMMUNITY)
Admission: AD | Admit: 2015-08-29 | Discharge: 2015-09-04 | DRG: 781 | Disposition: A | Discharge status: Home / Self Care | Payer: Medicaid Other | Source: Ambulatory Visit | Attending: Family Medicine | Admitting: Family Medicine

## 2015-08-29 DIAGNOSIS — O99213 Obesity complicating pregnancy, third trimester: Secondary | ICD-10-CM

## 2015-08-29 DIAGNOSIS — R8271 Bacteriuria: Secondary | ICD-10-CM

## 2015-08-29 DIAGNOSIS — O99013 Anemia complicating pregnancy, third trimester: Secondary | ICD-10-CM

## 2015-08-29 DIAGNOSIS — Z6841 Body Mass Index (BMI) 40.0 and over, adult: Secondary | ICD-10-CM

## 2015-08-29 DIAGNOSIS — N200 Calculus of kidney: Secondary | ICD-10-CM

## 2015-08-29 DIAGNOSIS — O2303 Infections of kidney in pregnancy, third trimester: Principal | ICD-10-CM

## 2015-08-29 DIAGNOSIS — E871 Hypo-osmolality and hyponatremia: Secondary | ICD-10-CM | POA: Diagnosis not present

## 2015-08-29 DIAGNOSIS — O99891 Other specified diseases and conditions complicating pregnancy: Secondary | ICD-10-CM

## 2015-08-29 DIAGNOSIS — R000 Tachycardia, unspecified: Secondary | ICD-10-CM

## 2015-08-29 DIAGNOSIS — O26833 Pregnancy related renal disease, third trimester: Secondary | ICD-10-CM | POA: Diagnosis present

## 2015-08-29 DIAGNOSIS — R0682 Tachypnea, not elsewhere classified: Secondary | ICD-10-CM

## 2015-08-29 DIAGNOSIS — O99513 Diseases of the respiratory system complicating pregnancy, third trimester: Secondary | ICD-10-CM | POA: Diagnosis present

## 2015-08-29 DIAGNOSIS — B964 Proteus (mirabilis) (morganii) as the cause of diseases classified elsewhere: Secondary | ICD-10-CM | POA: Diagnosis present

## 2015-08-29 DIAGNOSIS — N12 Tubulo-interstitial nephritis, not specified as acute or chronic: Secondary | ICD-10-CM

## 2015-08-29 DIAGNOSIS — D649 Anemia, unspecified: Secondary | ICD-10-CM | POA: Diagnosis present

## 2015-08-29 DIAGNOSIS — J449 Chronic obstructive pulmonary disease, unspecified: Secondary | ICD-10-CM | POA: Diagnosis present

## 2015-08-29 DIAGNOSIS — Z87891 Personal history of nicotine dependence: Secondary | ICD-10-CM

## 2015-08-29 DIAGNOSIS — O34211 Maternal care for low transverse scar from previous cesarean delivery: Secondary | ICD-10-CM | POA: Diagnosis present

## 2015-08-29 DIAGNOSIS — Z3493 Encounter for supervision of normal pregnancy, unspecified, third trimester: Secondary | ICD-10-CM

## 2015-08-29 DIAGNOSIS — Z3A29 29 weeks gestation of pregnancy: Secondary | ICD-10-CM

## 2015-08-29 DIAGNOSIS — R0602 Shortness of breath: Secondary | ICD-10-CM

## 2015-08-29 DIAGNOSIS — O9989 Other specified diseases and conditions complicating pregnancy, childbirth and the puerperium: Secondary | ICD-10-CM

## 2015-08-29 LAB — COMPREHENSIVE METABOLIC PANEL
ALBUMIN: 3 g/dL — AB (ref 3.5–5.0)
ALK PHOS: 59 U/L (ref 38–126)
ALT: 20 U/L (ref 14–54)
ANION GAP: 7 (ref 5–15)
AST: 17 U/L (ref 15–41)
BUN: 9 mg/dL (ref 6–20)
CALCIUM: 9 mg/dL (ref 8.9–10.3)
CO2: 21 mmol/L — ABNORMAL LOW (ref 22–32)
Chloride: 107 mmol/L (ref 101–111)
Creatinine, Ser: 0.72 mg/dL (ref 0.44–1.00)
GFR calc Af Amer: >60 mL/min (ref >60)
GFR calc non Af Amer: >60 mL/min (ref >60)
Glucose, Bld: 105 mg/dL — ABNORMAL HIGH (ref 65–99)
Potassium: 3.6 mmol/L (ref 3.5–5.1)
SODIUM: 135 mmol/L (ref 135–145)
TOTAL PROTEIN: 6.9 g/dL (ref 6.5–8.1)
Total Bilirubin: 0.6 mg/dL (ref 0.3–1.2)

## 2015-08-29 LAB — TYPE AND SCREEN
ABO/RH(D): O POS
Antibody Screen: NEGATIVE

## 2015-08-29 LAB — CBC
HCT: 31.2 % — ABNORMAL LOW (ref 36.0–46.0)
HEMOGLOBIN: 10.2 g/dL — AB (ref 12.0–15.0)
MCH: 27.9 pg (ref 26.0–34.0)
MCHC: 32.7 g/dL (ref 30.0–36.0)
MCV: 85.2 fL (ref 78.0–100.0)
Platelets: 213 10*3/uL (ref 150–400)
RBC: 3.66 MIL/uL — AB (ref 3.87–5.11)
RDW: 13.6 % (ref 11.5–15.5)
WBC: 5.4 10*3/uL (ref 4.0–10.5)

## 2015-08-29 LAB — URINALYSIS, ROUTINE W REFLEX MICROSCOPIC
Bilirubin Urine: NEGATIVE
GLUCOSE, UA: NEGATIVE mg/dL
Ketones, ur: NEGATIVE mg/dL
Nitrite: NEGATIVE
PH: 6.5 (ref 5.0–8.0)
Protein, ur: 30 mg/dL — AB
SPECIFIC GRAVITY, URINE: 1.02 (ref 1.005–1.030)

## 2015-08-29 LAB — URINE MICROSCOPIC-ADD ON

## 2015-08-29 LAB — ABO/RH: ABO/RH(D): O POS

## 2015-08-29 MED ORDER — DOCUSATE SODIUM 100 MG PO CAPS
100.0000 mg | ORAL_CAPSULE | Freq: Every day | ORAL | Status: DC
  Administered 2015-08-31: 100 mg via ORAL
  Filled 2015-08-29: qty 1

## 2015-08-29 MED ORDER — LACTATED RINGERS IV BOLUS (SEPSIS)
1000.0000 mL | Freq: Once | INTRAVENOUS | Status: AC
Start: 2015-08-29 — End: 2015-08-29
  Administered 2015-08-29: 1000 mL via INTRAVENOUS

## 2015-08-29 MED ORDER — KETOROLAC TROMETHAMINE 30 MG/ML IJ SOLN
30.0000 mg | Freq: Four times a day (QID) | INTRAMUSCULAR | Status: AC
  Administered 2015-08-30 – 2015-08-31 (×5): 30 mg via INTRAVENOUS
  Filled 2015-08-29 (×5): qty 1

## 2015-08-29 MED ORDER — ONDANSETRON 4 MG PO TBDP
4.0000 mg | ORAL_TABLET | Freq: Once | ORAL | Status: AC
  Administered 2015-08-29: 4 mg via ORAL
  Filled 2015-08-29: qty 1

## 2015-08-29 MED ORDER — KETOROLAC TROMETHAMINE 60 MG/2ML IM SOLN
60.0000 mg | INTRAMUSCULAR | Status: AC
Start: 2015-08-29 — End: 2015-08-29
  Administered 2015-08-29: 60 mg via INTRAMUSCULAR
  Filled 2015-08-29: qty 2

## 2015-08-29 MED ORDER — CYCLOBENZAPRINE HCL 10 MG PO TABS
10.0000 mg | ORAL_TABLET | Freq: Three times a day (TID) | ORAL | Status: DC | PRN
  Administered 2015-08-29 – 2015-09-03 (×4): 10 mg via ORAL
  Filled 2015-08-29 (×4): qty 1

## 2015-08-29 MED ORDER — HYDROMORPHONE HCL 2 MG PO TABS
2.0000 mg | ORAL_TABLET | Freq: Four times a day (QID) | ORAL | Status: DC | PRN
  Administered 2015-08-29 – 2015-08-30 (×2): 2 mg via ORAL
  Filled 2015-08-29 (×3): qty 1

## 2015-08-29 MED ORDER — HYDROMORPHONE HCL 1 MG/ML IJ SOLN
1.0000 mg | Freq: Once | INTRAMUSCULAR | Status: AC
  Administered 2015-08-29: 1 mg via INTRAVENOUS
  Filled 2015-08-29: qty 1

## 2015-08-29 MED ORDER — ZOLPIDEM TARTRATE 5 MG PO TABS: 5.0000 mg | ORAL_TABLET | Freq: Every evening | ORAL | Status: DC | PRN

## 2015-08-29 MED ORDER — PROMETHAZINE HCL 25 MG/ML IJ SOLN
25.0000 mg | Freq: Four times a day (QID) | INTRAMUSCULAR | Status: DC | PRN
  Administered 2015-08-31: 25 mg via INTRAVENOUS
  Filled 2015-08-29: qty 1

## 2015-08-29 MED ORDER — ACETAMINOPHEN 325 MG PO TABS
650.0000 mg | ORAL_TABLET | ORAL | Status: DC | PRN
  Administered 2015-08-30 – 2015-09-01 (×5): 650 mg via ORAL
  Filled 2015-08-29 (×5): qty 2

## 2015-08-29 MED ORDER — DIPHENHYDRAMINE HCL 25 MG PO CAPS
25.0000 mg | ORAL_CAPSULE | Freq: Every evening | ORAL | Status: DC | PRN
  Filled 2015-08-29: qty 1

## 2015-08-29 MED ORDER — PRENATAL MULTIVITAMIN CH
1.0000 | ORAL_TABLET | Freq: Every day | ORAL | Status: DC
  Administered 2015-08-30 – 2015-09-04 (×6): 1 via ORAL
  Filled 2015-08-29 (×6): qty 1

## 2015-08-29 MED ORDER — CALCIUM CARBONATE ANTACID 500 MG PO CHEW: 2.0000 | CHEWABLE_TABLET | ORAL | Status: DC | PRN

## 2015-08-29 MED ORDER — SODIUM CHLORIDE 0.9 % IV SOLN
INTRAVENOUS | Status: DC
  Administered 2015-08-29 – 2015-09-01 (×8): via INTRAVENOUS

## 2015-08-29 MED ORDER — DEXTROSE 5 % IV SOLN
2.0000 g | INTRAVENOUS | Status: DC
  Administered 2015-08-29 – 2015-08-31 (×3): 2 g via INTRAVENOUS
  Filled 2015-08-29 (×3): qty 2

## 2015-08-29 NOTE — H&P (Signed)
Author: Hurshel Party, CNM Service: Obstetrics Author Type: Certified Nurse Midwife    Filed: 08/29/2015 6:35 PM Note Time: 08/29/2015 6:16 PM Status: Cosign Needed   Editor: Hurshel Party, CNM (Certified Nurse Midwife)     Related Notes: Original Note by Hurshel Party, CNM (Certified Nurse Midwife) filed at 08/29/2015 6:32 PM   Cosign Required: Yes       Expand All Collapse All   Chief Complaint: Back Pain and Abdominal Pain  First Provider Initiated Contact with Patient 08/29/15 1309    HPI: [name] is a 30 y.o. G3P2002 at 60w4dwho presents to maternity admissions reporting acute onset of left flank pain radiating into her groin this morning at 11 am, worsening over the course of the day. Now, the pain is 10/10, throbbing, and not resolved by Tylenol or heat. It is associated with nausea and vomiting with emesis today x 3. She denies family or personal hx of renal stones. Her pregnancy has been uncomplicated and she receives care in [facility].  She reports good fetal movement, denies LOF, vaginal bleeding, vaginal itching/burning, urinary symptoms, h/a, dizziness, n/v, or fever/chills.    HPI  Past Medical History: Past Medical History  Diagnosis Date  . Asthma   . Seasonal allergies   . Headache(784.0)   . COPD (chronic obstructive pulmonary disease) (HCC)     Past obstetric history: OB History  Gravida Para Term Preterm AB SAB TAB Ectopic Multiple Living  3 2 2  0 0 0 0 0 0 2    # Outcome Date GA Lbr Len/2nd Weight Sex Delivery Anes PTL Lv  3 Current           2 Term 02/27/06 [redacted]w[redacted]d  7 lb 10 oz (3.459 kg) M VBAC EPI Y Y   Complications: Preterm labor  1 Term 02/13/05 [redacted]w[redacted]d  6 lb 9 oz (2.977 kg) F CS-Unspec EPI  Y   Complications: Failure to Progress in First Stage      Past Surgical History: Past Surgical History  Procedure Laterality Date   . Tonsillectomy    . Wisdom tooth extraction    . Cesarean section      Family History: Family History  Problem Relation Age of Onset  . Adopted: Yes    Social History: Social History  Substance Use Topics  . Smoking status: Former Smoker -- 0.30 packs/day for 5 years    Types: Cigarettes  . Smokeless tobacco: Never Used  . Alcohol Use: No    Allergies:  Allergies  Allergen Reactions  . Sage Clent Jacks Officinalis] Swelling    Meds:  Prescriptions prior to admission  Medication Sig Dispense Refill Last Dose  . cetirizine (ZYRTEC) 10 MG tablet Take 10 mg by mouth daily as needed for allergies.    Past Month at Unknown time  . cyclobenzaprine (FLEXERIL) 10 MG tablet Take 1 tablet (10 mg total) by mouth every 8 (eight) hours as needed for muscle spasms. 30 tablet 2 08/29/2015 at Unknown time  . diphenhydrAMINE (BENADRYL) 25 MG tablet Take 25 mg by mouth at bedtime as needed for allergies.   08/28/2015 at Unknown time  . fluticasone (FLONASE) 50 MCG/ACT nasal spray Place 2 sprays into both nostrils daily as needed for allergies.    Past Month at Unknown time  . Prenatal Vit-Fe Fumarate-FA (PRENATAL VITAMIN) 27-0.8 MG TABS Take 1 tablet by mouth daily. 30 tablet 12 Past Week at Unknown time    ROS:  Review of Systems  Constitutional: Negative  for fever, chills and fatigue.  Respiratory: Negative for shortness of breath.  Cardiovascular: Negative for chest pain.  Gastrointestinal: Positive for nausea, vomiting and abdominal pain.  Genitourinary: Positive for dysuria, flank pain, difficulty urinating and pelvic pain. Negative for vaginal bleeding, vaginal discharge and vaginal pain.  Musculoskeletal: Positive for back pain.  Neurological: Negative for dizziness and headaches.  Psychiatric/Behavioral: Negative.     I have reviewed patient's Past Medical Hx, Surgical Hx, Family Hx, Social Hx, medications  and allergies.   Physical Exam  Patient Vitals for the past 24 hrs:  BP Temp Temp src Pulse Resp SpO2  08/29/15 1753 119/72 mmHg - - 78 18 99 %  08/29/15 1256 140/84 mmHg 97.6 F (36.4 C) Oral 95 22 98 %   Constitutional: Well-developed, well-nourished female in moderate distress.  Cardiovascular: normal rate Respiratory: normal effort GI: Abd soft, non-tender, gravid appropriate for gestational age.  MS: Extremities nontender, no edema, normal ROM Neurologic: Alert and oriented x 4.  GU: Positive CVA tenderness on right   Dilation: Closed Effacement (%): Thick Cervical Position: Posterior Exam by:: Sharen Counter CNM  FHT: Baseline 140 , moderate variability, accelerations present, no decelerations Contractions: None on toco or to palpation  Labs:  Lab Results Last 24 Hours    Results for orders placed or performed during the hospital encounter of 08/29/15 (from the past 24 hour(s))  Urinalysis, Routine w reflex microscopic (not at [facility]) Status: Abnormal   Collection Time: 08/29/15 1:00 PM  Result Value Ref Range   Color, Urine YELLOW YELLOW   APPearance HAZY (A) CLEAR   Specific Gravity, Urine 1.020 1.005 - 1.030   pH 6.5 5.0 - 8.0   Glucose, UA NEGATIVE NEGATIVE mg/dL   Hgb urine dipstick LARGE (A) NEGATIVE   Bilirubin Urine NEGATIVE NEGATIVE   Ketones, ur NEGATIVE NEGATIVE mg/dL   Protein, ur 30 (A) NEGATIVE mg/dL   Nitrite NEGATIVE NEGATIVE   Leukocytes, UA MODERATE (A) NEGATIVE  Urine microscopic-add on Status: Abnormal   Collection Time: 08/29/15 1:00 PM  Result Value Ref Range   Squamous Epithelial / LPF 0-5 (A) NONE SEEN   WBC, UA 6-30 0 - 5 WBC/hpf   RBC / HPF TOO NUMEROUS TO COUNT 0 - 5 RBC/hpf   Bacteria, UA MANY (A) NONE SEEN   Urine-Other MUCOUS PRESENT   CBC Status: Abnormal   Collection Time: 08/29/15 1:40 PM  Result  Value Ref Range   WBC 5.4 4.0 - 10.5 K/uL   RBC 3.66 (L) 3.87 - 5.11 MIL/uL   Hemoglobin 10.2 (L) 12.0 - 15.0 g/dL   HCT 16.1 (L) 09.6 - 04.5 %   MCV 85.2 78.0 - 100.0 fL   MCH 27.9 26.0 - 34.0 pg   MCHC 32.7 30.0 - 36.0 g/dL   RDW 40.9 81.1 - 91.4 %   Platelets 213 150 - 400 K/uL  Comprehensive metabolic panel Status: Abnormal   Collection Time: 08/29/15 1:40 PM  Result Value Ref Range   Sodium 135 135 - 145 mmol/L   Potassium 3.6 3.5 - 5.1 mmol/L   Chloride 107 101 - 111 mmol/L   CO2 21 (L) 22 - 32 mmol/L   Glucose, Bld 105 (H) 65 - 99 mg/dL   BUN 9 6 - 20 mg/dL   Creatinine, Ser 7.82 0.44 - 1.00 mg/dL   Calcium 9.0 8.9 - 95.6 mg/dL   Total Protein 6.9 6.5 - 8.1 g/dL   Albumin 3.0 (L) 3.5 - 5.0 g/dL   AST  17 15 - 41 U/L   ALT 20 14 - 54 U/L   Alkaline Phosphatase 59 38 - 126 U/L   Total Bilirubin 0.6 0.3 - 1.2 mg/dL   GFR calc non Af Amer >60 >60 mL/min   GFR calc Af Amer >60 >60 mL/min   Anion gap 7 5 - 15     O/POS/-- (02/14 1533)  Imaging:  [place] Renal  08/29/2015 CLINICAL DATA: Left-sided flank pain for 4 hours, third trimester pregnancy EXAM: RENAL / URINARY TRACT ULTRASOUND COMPLETE COMPARISON: None. FINDINGS: Right Kidney: Length: 10.4 cm. Echogenicity within normal limits. No mass or hydronephrosis visualized. Left Kidney: Length: 14.0 cm. Echogenicity within normal limits. No mass or hydronephrosis visualized. Bladder: Not well visualized. IMPRESSION: No obstructive changes are noted. Electronically Signed By: Alcide CleverMark Lukens M.D. On: 08/29/2015 16:09     MAU Course/MDM: I have ordered labs and US and reviewed results. Clinical presentation c/w renal stones despite normal renal US. No obstetric complications. Pyelonephritis suspected r/t UA results and pt does have n/v and CVA tenderness so cannot rule out infection despite normal WBCs. Urine sent  for culture. Pain not well controlled in MAU on Dilaudid 2 mg (given in 2 separate 1 mg doses IV). Consult Dr Debroah LoopArnold. Admit to antepartum for pyelonephritis and pain management. Pt stable at time of transfer.  Assessment: 1. Kidney stone on left side   2. Maternal kidney stone, third trimester   3. Pyelonephritis affecting pregnancy in third trimester, antepartum     Plan: Admit to antepartum Pain management with Toradol and Dilaudid Rocephin 2 g Q 24 hours IV IV fluids Fetal monitoring Q shift    Medication List    ASK your doctor about these medications       cetirizine 10 MG tablet  Commonly known as: ZYRTEC  Take 10 mg by mouth daily as needed for allergies.     cyclobenzaprine 10 MG tablet  Commonly known as: FLEXERIL  Take 1 tablet (10 mg total) by mouth every 8 (eight) hours as needed for muscle spasms.     diphenhydrAMINE 25 MG tablet  Commonly known as: BENADRYL  Take 25 mg by mouth at bedtime as needed for allergies.     fluticasone 50 MCG/ACT nasal spray  Commonly known as: FLONASE  Place 2 sprays into both nostrils daily as needed for allergies.     Prenatal Vitamin 27-0.8 MG Tabs  Take 1 tablet by mouth daily.        Sharen CounterLisa Leftwich-Kirby Certified Nurse-Midwife 08/29/2015 6:16 PM     Attestation of Attending Supervision of Advanced Practitioner (CNM/NP/PA): Evaluation and management procedures were performed by the Advanced Practitioner under my supervision and collaboration. I have reviewed the Advanced Practitioner's note and chart, examined the patient and I agree with the management and plan.  Scheryl DarterJames Arnold MD

## 2015-08-29 NOTE — MAU Provider Note (Deleted)
MAU HISTORY AND PHYSICAL  Chief Complaint:  Back Pain and Abdominal Pain   Caitlin Grumblinglecia R Addison is a 30 y.o.  [id number] with IUP at 2575w4d presenting for Back Pain and Abdominal Pain . Pt states that today at around 11AM upon waking started to have some mild left flank pain that radiated to groin.  This was intermittent, throbbing like quality 5-7/10.  This pain then intensified very quickly to 10/10 and became unrelenting, was accompanied by n/v (3 episodes of emesis).She denies any history of kidney stones or family hx.  Reports a normal pregnancy thus far without complications.  Denies any contractions, VB, LOF, urinary symptoms.  Reports fetal movement positive.    Past Medical History  Diagnosis Date  . Asthma   . Seasonal allergies   . Headache(784.0)   . COPD (chronic obstructive pulmonary disease) [facility](HCC)     Past Surgical History  Procedure Laterality Date  . Tonsillectomy    . Wisdom tooth extraction    . Cesarean section      Family History  Problem Relation Age of Onset  . Adopted: Yes    Social History  Substance Use Topics  . Smoking status: Former Smoker -- 0.30 packs/day for 5 years    Types: Cigarettes  . Smokeless tobacco: Never Used  . Alcohol Use: No    Allergies  Allergen Reactions  . Earnestine LeysSage [Salvia Officinalis] Swelling    Prescriptions prior to admission  Medication Sig Dispense Refill Last Dose  . cetirizine (ZYRTEC) 10 MG tablet Take 10 mg by mouth daily as needed for allergies.    Past Month at Unknown time  . cyclobenzaprine (FLEXERIL) 10 MG tablet Take 1 tablet (10 mg total) by mouth every 8 (eight) hours as needed for muscle spasms. 30 tablet 2 08/29/2015 at Unknown time  . diphenhydrAMINE (BENADRYL) 25 MG tablet Take 25 mg by mouth at bedtime as needed for allergies.   08/28/2015 at Unknown time  . fluticasone (FLONASE) 50 MCG/ACT nasal spray Place 2 sprays into both nostrils daily as needed for allergies.    Past Month at Unknown time  . Prenatal Vit-Fe  Fumarate-FA (PRENATAL VITAMIN) 27-0.8 MG TABS Take 1 tablet by mouth daily. 30 tablet 12 Past Week at Unknown time    Review of Systems - Negative except for what is mentioned in HPI.  Physical Exam  Blood pressure 140/84, pulse 95, temperature 97.6 F (36.4 C), temperature source Oral, resp. rate 22, last menstrual period 02/03/2015, SpO2 98 %. GENERAL: Well-developed, well-nourished female in no acute distress.  LUNGS: Clear to auscultation bilaterally.  HEART: Regular rate and rhythm. ABDOMEN: Soft, nontender, nondistended, gravid.  EXTREMITIES: Nontender, no edema, 2+ distal pulses. Cervical Exam: 0/thick/posterior Presentation: cephalic FHT:  Baseline 150, mod variability, +acels, no decels Contractions: None   Labs: No results found for this or any previous visit (from the past 24 hour(s)).  Imaging Studies:  [place] Mfm Ob Follow Up  08/12/2015  OBSTETRICAL ULTRASOUND: This exam was performed within a [facility]. The OB US report was generated in the AS system, and faxed to the ordering physician.  This report is available in the [employer] PACS. See the AS Obstetric US report via the Image Link.   Assessment: [name] is  30 y.o. G3P2002 at 1575w4d presents with Back Pain and Abdominal Pain .  Plan: #Kidney stone -Ddx: UTI, MSK, rib fracture, vaginal infection -UA/ UCx -Renal u/s to r/o kidney stone, hydronephrosis -LR bolus 1L now -Dilaudid 1  IV x2, able to sleep-->trial toradol  x 1 -UA +LE, -Nitrate, SG 1.020 -CBC, CMP wnl -Plan to admit for pain control   Olena Leatherwood 6/9/20171:15 PM

## 2015-08-29 NOTE — MAU Note (Signed)
Onset of left back pain radiating to abdomen a couple of hours ago, pain is constant, rates 8/10, no vaginal bleeding, denies constipation, vomiting today, denies constipation, no dysuria.

## 2015-08-29 NOTE — MAU Note (Signed)
Patient removed from efm, reviewed by Sharen CounterLisa Leftwich Kirby CNM

## 2015-08-29 NOTE — MAU Note (Signed)
Sharen CounterLisa Leftwich-Kirby, CNM called neonatologist to confirm it was ok to admit pt to antenatal.  Neonatologist gave permission to admit pt.

## 2015-08-29 NOTE — MAU Provider Note (Signed)
Chief Complaint:  Back Pain and Abdominal Pain   First Provider Initiated Contact with Patient 08/29/15 1309      HPI: [name] is a 30 y.o. G3P2002 at 78w4dwho presents to maternity admissions reporting acute onset of left flank pain radiating into her groin this morning at 11 am, worsening over the course of the day.  Now, the pain is 10/10, throbbing, and not resolved by Tylenol or heat.  It is associated with nausea and vomiting with emesis today x 3.  She denies family or personal hx of renal stones.  Her pregnancy has been uncomplicated and she receives care in [facility].   She reports good fetal movement, denies LOF, vaginal bleeding, vaginal itching/burning, urinary symptoms, h/a, dizziness, n/v, or fever/chills.     HPI  Past Medical History: Past Medical History  Diagnosis Date  . Asthma   . Seasonal allergies   . Headache(784.0)   . COPD (chronic obstructive pulmonary disease) (HCC)     Past obstetric history: OB History  Gravida Para Term Preterm AB SAB TAB Ectopic Multiple Living  0 0 0 0 0 0 2    # Outcome Date GA Lbr Len/2nd Weight Sex Delivery Anes PTL Lv  3 Current           2 Term 02/27/06 [redacted]w[redacted]d  7 lb 10 oz (3.459 kg) M VBAC EPI Y Y     Complications: Preterm labor  1 Term 02/13/05 [redacted]w[redacted]d  6 lb 9 oz (2.977 kg) F CS-Unspec EPI  Y     Complications: Failure to Progress in First Stage      Past Surgical History: Past Surgical History  Procedure Laterality Date  . Tonsillectomy    . Wisdom tooth extraction    . Cesarean section      Family History: Family History  Problem Relation Age of Onset  . Adopted: Yes    Social History: Social History  Substance Use Topics  . Smoking status: Former Smoker -- 0.30 packs/day for 5 years    Types: Cigarettes  . Smokeless tobacco: Never Used  . Alcohol Use: No    Allergies:  Allergies  Allergen Reactions  . Sage Clent Jacks Officinalis] Swelling    Meds:  Prescriptions prior to admission   Medication Sig Dispense Refill Last Dose  . cetirizine (ZYRTEC) 10 MG tablet Take 10 mg by mouth daily as needed for allergies.    Past Month at Unknown time  . cyclobenzaprine (FLEXERIL) 10 MG tablet Take 1 tablet (10 mg total) by mouth every 8 (eight) hours as needed for muscle spasms. 30 tablet 2 08/29/2015 at Unknown time  . diphenhydrAMINE (BENADRYL) 25 MG tablet Take 25 mg by mouth at bedtime as needed for allergies.   08/28/2015 at Unknown time  . fluticasone (FLONASE) 50 MCG/ACT nasal spray Place 2 sprays into both nostrils daily as needed for allergies.    Past Month at Unknown time  . Prenatal Vit-Fe Fumarate-FA (PRENATAL VITAMIN) 27-0.8 MG TABS Take 1 tablet by mouth daily. 30 tablet 12 Past Week at Unknown time    ROS:  Review of Systems  Constitutional: Negative for fever, chills and fatigue.  Respiratory: Negative for shortness of breath.   Cardiovascular: Negative for chest pain.  Gastrointestinal: Positive for nausea, vomiting and abdominal pain.  Genitourinary: Positive for dysuria, flank pain, difficulty urinating and pelvic pain. Negative for vaginal bleeding, vaginal discharge and vaginal pain.  Musculoskeletal: Positive for back pain.  Neurological: Negative for dizziness and  headaches.  Psychiatric/Behavioral: Negative.      I have reviewed patient's Past Medical Hx, Surgical Hx, Family Hx, Social Hx, medications and allergies.   Physical Exam  Patient Vitals for the past 24 hrs:  BP Temp Temp src Pulse Resp SpO2  08/29/15 1753 119/72 mmHg - - 78 18 99 %  08/29/15 1256 140/84 mmHg 97.6 F (36.4 C) Oral 95 22 98 %   Constitutional: Well-developed, well-nourished female in moderate distress.  Cardiovascular: normal rate Respiratory: normal effort GI: Abd soft, non-tender, gravid appropriate for gestational age.  MS: Extremities nontender, no edema, normal ROM Neurologic: Alert and oriented x 4.  GU: Positive CVA tenderness on right   Dilation:  Closed Effacement (%): Thick Cervical Position: Posterior Exam by:: Sharen Counter CNM  FHT:  Baseline 140 , moderate variability, accelerations present, no decelerations Contractions: None on toco or to palpation   Labs: Results for orders placed or performed during the hospital encounter of 08/29/15 (from the past 24 hour(s))  Urinalysis, Routine w reflex microscopic (not at [facility])     Status: Abnormal   Collection Time: 08/29/15  1:00 PM  Result Value Ref Range   Color, Urine YELLOW YELLOW   APPearance HAZY (A) CLEAR   Specific Gravity, Urine 1.020 1.005 - 1.030   pH 6.5 5.0 - 8.0   Glucose, UA NEGATIVE NEGATIVE mg/dL   Hgb urine dipstick LARGE (A) NEGATIVE   Bilirubin Urine NEGATIVE NEGATIVE   Ketones, ur NEGATIVE NEGATIVE mg/dL   Protein, ur 30 (A) NEGATIVE mg/dL   Nitrite NEGATIVE NEGATIVE   Leukocytes, UA MODERATE (A) NEGATIVE  Urine microscopic-add on     Status: Abnormal   Collection Time: 08/29/15  1:00 PM  Result Value Ref Range   Squamous Epithelial / LPF 0-5 (A) NONE SEEN   WBC, UA 6-30 0 - 5 WBC/hpf   RBC / HPF TOO NUMEROUS TO COUNT 0 - 5 RBC/hpf   Bacteria, UA MANY (A) NONE SEEN   Urine-Other MUCOUS PRESENT   CBC     Status: Abnormal   Collection Time: 08/29/15  1:40 PM  Result Value Ref Range   WBC 5.4 4.0 - 10.5 K/uL   RBC 3.66 (L) 3.87 - 5.11 MIL/uL   Hemoglobin 10.2 (L) 12.0 - 15.0 g/dL   HCT 16.1 (L) 09.6 - 04.5 %   MCV 85.2 78.0 - 100.0 fL   MCH 27.9 26.0 - 34.0 pg   MCHC 32.7 30.0 - 36.0 g/dL   RDW 40.9 81.1 - 91.4 %   Platelets 213 150 - 400 K/uL  Comprehensive metabolic panel     Status: Abnormal   Collection Time: 08/29/15  1:40 PM  Result Value Ref Range   Sodium 135 135 - 145 mmol/L   Potassium 3.6 3.5 - 5.1 mmol/L   Chloride 107 101 - 111 mmol/L   CO2 21 (L) 22 - 32 mmol/L   Glucose, Bld 105 (H) 65 - 99 mg/dL   BUN 9 6 - 20 mg/dL   Creatinine, Ser 7.82 0.44 - 1.00 mg/dL   Calcium 9.0 8.9 - 95.6 mg/dL   Total Protein 6.9 6.5 - 8.1  g/dL   Albumin 3.0 (L) 3.5 - 5.0 g/dL   AST 17 15 - 41 U/L   ALT 20 14 - 54 U/L   Alkaline Phosphatase 59 38 - 126 U/L   Total Bilirubin 0.6 0.3 - 1.2 mg/dL   GFR calc non Af Amer >60 >60 mL/min   GFR calc Af  Amer >60 >60 mL/min   Anion gap 7 5 - 15   O/POS/-- (02/14 1533)  Imaging:  [place] Renal  08/29/2015  CLINICAL DATA:  Left-sided flank pain for 4 hours, third trimester pregnancy EXAM: RENAL / URINARY TRACT ULTRASOUND COMPLETE COMPARISON:  None. FINDINGS: Right Kidney: Length: 10.4 cm. Echogenicity within normal limits. No mass or hydronephrosis visualized. Left Kidney: Length: 14.0 cm. Echogenicity within normal limits. No mass or hydronephrosis visualized. Bladder: Not well visualized. IMPRESSION: No obstructive changes are noted. Electronically Signed   By: Alcide CleverMark  Lukens M.D.   On: 08/29/2015 16:09     MAU Course/MDM: I have ordered labs and US and reviewed results.  Clinical presentation c/w renal stones despite normal renal US.  No obstetric complications.  Pyelonephritis suspected r/t UA results and pt does have n/v and CVA tenderness so cannot rule out infection despite normal WBCs. Urine sent for culture.  Pain not well controlled in MAU on Dilaudid 2 mg (given in 2 separate 1 mg doses IV).  Consult Dr Debroah LoopArnold.  Admit to antepartum for pyelonephritis and pain management.  Pt stable at time of transfer.  Assessment: 1. Kidney stone on left side   2. Maternal kidney stone, third trimester   3. Pyelonephritis affecting pregnancy in third trimester, antepartum     Plan: Admit to antepartum Pain management with Toradol and Dilaudid Rocephin 2 g Q 24 hours IV IV fluids Fetal monitoring Q shift    Medication List    ASK your doctor about these medications        cetirizine 10 MG tablet  Commonly known as:  ZYRTEC  Take 10 mg by mouth daily as needed for allergies.     cyclobenzaprine 10 MG tablet  Commonly known as:  FLEXERIL  Take 1 tablet (10 mg total) by mouth every 8  (eight) hours as needed for muscle spasms.     diphenhydrAMINE 25 MG tablet  Commonly known as:  BENADRYL  Take 25 mg by mouth at bedtime as needed for allergies.     fluticasone 50 MCG/ACT nasal spray  Commonly known as:  FLONASE  Place 2 sprays into both nostrils daily as needed for allergies.     Prenatal Vitamin 27-0.8 MG Tabs  Take 1 tablet by mouth daily.        Sharen CounterLisa Leftwich-Kirby Certified Nurse-Midwife 08/29/2015 6:16 PM

## 2015-08-30 ENCOUNTER — Observation Stay (HOSPITAL_COMMUNITY): Payer: Medicaid Other

## 2015-08-30 DIAGNOSIS — D649 Anemia, unspecified: Secondary | ICD-10-CM | POA: Diagnosis present

## 2015-08-30 DIAGNOSIS — O2303 Infections of kidney in pregnancy, third trimester: Secondary | ICD-10-CM | POA: Diagnosis present

## 2015-08-30 DIAGNOSIS — O99213 Obesity complicating pregnancy, third trimester: Secondary | ICD-10-CM | POA: Diagnosis present

## 2015-08-30 DIAGNOSIS — R0682 Tachypnea, not elsewhere classified: Secondary | ICD-10-CM | POA: Diagnosis present

## 2015-08-30 DIAGNOSIS — O34211 Maternal care for low transverse scar from previous cesarean delivery: Secondary | ICD-10-CM | POA: Diagnosis present

## 2015-08-30 DIAGNOSIS — O26833 Pregnancy related renal disease, third trimester: Secondary | ICD-10-CM | POA: Diagnosis present

## 2015-08-30 DIAGNOSIS — Z3A29 29 weeks gestation of pregnancy: Secondary | ICD-10-CM

## 2015-08-30 DIAGNOSIS — Z6841 Body Mass Index (BMI) 40.0 and over, adult: Secondary | ICD-10-CM | POA: Diagnosis not present

## 2015-08-30 DIAGNOSIS — N12 Tubulo-interstitial nephritis, not specified as acute or chronic: Secondary | ICD-10-CM | POA: Diagnosis present

## 2015-08-30 DIAGNOSIS — Z87891 Personal history of nicotine dependence: Secondary | ICD-10-CM | POA: Diagnosis not present

## 2015-08-30 DIAGNOSIS — B964 Proteus (mirabilis) (morganii) as the cause of diseases classified elsewhere: Secondary | ICD-10-CM | POA: Diagnosis present

## 2015-08-30 DIAGNOSIS — N200 Calculus of kidney: Secondary | ICD-10-CM | POA: Diagnosis present

## 2015-08-30 DIAGNOSIS — O99513 Diseases of the respiratory system complicating pregnancy, third trimester: Secondary | ICD-10-CM | POA: Diagnosis present

## 2015-08-30 DIAGNOSIS — R0689 Other abnormalities of breathing: Secondary | ICD-10-CM | POA: Diagnosis not present

## 2015-08-30 DIAGNOSIS — J449 Chronic obstructive pulmonary disease, unspecified: Secondary | ICD-10-CM | POA: Diagnosis present

## 2015-08-30 DIAGNOSIS — E871 Hypo-osmolality and hyponatremia: Secondary | ICD-10-CM | POA: Diagnosis present

## 2015-08-30 DIAGNOSIS — R109 Unspecified abdominal pain: Secondary | ICD-10-CM | POA: Diagnosis not present

## 2015-08-30 DIAGNOSIS — O99013 Anemia complicating pregnancy, third trimester: Secondary | ICD-10-CM | POA: Diagnosis present

## 2015-08-30 LAB — COMPREHENSIVE METABOLIC PANEL
ALBUMIN: 2.6 g/dL — AB (ref 3.5–5.0)
ALK PHOS: 57 U/L (ref 38–126)
ALT: 20 U/L (ref 14–54)
AST: 19 U/L (ref 15–41)
Anion gap: 7 (ref 5–15)
BUN: 11 mg/dL (ref 6–20)
CALCIUM: 8.8 mg/dL — AB (ref 8.9–10.3)
CO2: 20 mmol/L — AB (ref 22–32)
CREATININE: 1.08 mg/dL — AB (ref 0.44–1.00)
Chloride: 106 mmol/L (ref 101–111)
GFR calc Af Amer: >60 mL/min (ref >60)
GFR calc non Af Amer: >60 mL/min (ref >60)
GLUCOSE: 113 mg/dL — AB (ref 65–99)
Potassium: 3.9 mmol/L (ref 3.5–5.1)
SODIUM: 133 mmol/L — AB (ref 135–145)
Total Bilirubin: 1.1 mg/dL (ref 0.3–1.2)
Total Protein: 6.2 g/dL — ABNORMAL LOW (ref 6.5–8.1)

## 2015-08-30 LAB — CBC
HCT: 28.8 % — ABNORMAL LOW (ref 36.0–46.0)
HEMOGLOBIN: 9.4 g/dL — AB (ref 12.0–15.0)
MCH: 27.8 pg (ref 26.0–34.0)
MCHC: 32.6 g/dL (ref 30.0–36.0)
MCV: 85.2 fL (ref 78.0–100.0)
Platelets: 182 10*3/uL (ref 150–400)
RBC: 3.38 MIL/uL — AB (ref 3.87–5.11)
RDW: 13.8 % (ref 11.5–15.5)
WBC: 11.6 10*3/uL — AB (ref 4.0–10.5)

## 2015-08-30 LAB — LACTIC ACID, PLASMA: Lactic Acid, Venous: 1.8 mmol/L (ref 0.5–2.0)

## 2015-08-30 MED ORDER — SODIUM CHLORIDE 0.9 % IV BOLUS (SEPSIS)
1000.0000 mL | Freq: Once | INTRAVENOUS | Status: AC
  Administered 2015-08-30: 1000 mL via INTRAVENOUS

## 2015-08-30 MED ORDER — HYDROMORPHONE HCL 2 MG PO TABS
1.0000 mg | ORAL_TABLET | ORAL | Status: DC | PRN
  Administered 2015-08-30 – 2015-09-02 (×4): 1 mg via ORAL
  Filled 2015-08-30 (×5): qty 1

## 2015-08-30 MED ORDER — BUTORPHANOL TARTRATE 1 MG/ML IJ SOLN
1.0000 mg | Freq: Once | INTRAMUSCULAR | Status: AC
  Administered 2015-08-30: 1 mg via INTRAVENOUS
  Filled 2015-08-30: qty 1

## 2015-08-30 MED ORDER — PROMETHAZINE HCL 25 MG/ML IJ SOLN
12.5000 mg | Freq: Once | INTRAMUSCULAR | Status: DC
  Filled 2015-08-30: qty 1

## 2015-08-30 NOTE — Progress Notes (Signed)
Pt stating that she doesn't need the nasal cannula

## 2015-08-30 NOTE — Progress Notes (Signed)
FACULTY PRACTICE ANTEPARTUM(COMPREHENSIVE) NOTE  [name] is a 30 y.o. G3P2002 at [redacted]w[redacted]d by LMP who is admitted for pyelonephritis.   Fetal presentation is unsure. Length of Stay:  1  Days  Subjective: Left flank pain, SOB Patient reports the fetal movement as active. Patient reports uterine contraction  activity as none. Patient reports  vaginal bleeding as none. Patient describes fluid per vagina as None.  Vitals:  Blood pressure 116/61, pulse 137, temperature 101.6 F (38.7 C), temperature source Oral, resp. rate 28, height 5' 3.5" (1.613 m), weight 115.667 kg (255 lb), last menstrual period 02/03/2015, SpO2 96 %. Physical Examination:  General appearance - alert, well appearing, and in no distress, in mild to moderate distress and O2 mask in use Chest clear to auscultation Heart - tachycardic and regular rhythm Abdomen - soft, nontender, nondistended Fundal Height:  size equals dates Cervical Exam: Not evaluated.. Extremities: extremities normal Membranes:intact  Fetal Monitoring:     Fetal Heart Rate A      Mode  External filed at 08/30/2015 0100    Baseline Rate (A)  160 bpm filed at 08/30/2015 0100    Variability  6-25 BPM filed at 08/30/2015 0100    Accelerations  10 x 10 filed at 08/30/2015 0100    Decelerations  Variable filed at 08/29/2015 2340   170-180 current  CLINICAL DATA: Left-sided flank pain for 4 hours, third trimester pregnancy  EXAM: RENAL / URINARY TRACT ULTRASOUND COMPLETE  COMPARISON: None.  FINDINGS: Right Kidney:  Length: 10.4 cm. Echogenicity within normal limits. No mass or hydronephrosis visualized.  Left Kidney:  Length: 14.0 cm. Echogenicity within normal limits. No mass or hydronephrosis visualized.  Bladder:  Not well visualized.  IMPRESSION: No obstructive changes are noted.   Electronically Signed  By: Alcide Clever M.D.  On: 08/29/2015 16:09        Labs:  Results for orders  placed or performed during the hospital encounter of 08/29/15 (from the past 24 hour(s))  Urinalysis, Routine w reflex microscopic (not at [facility])   Collection Time: 08/29/15  1:00 PM  Result Value Ref Range   Color, Urine YELLOW YELLOW   APPearance HAZY (A) CLEAR   Specific Gravity, Urine 1.020 1.005 - 1.030   pH 6.5 5.0 - 8.0   Glucose, UA NEGATIVE NEGATIVE mg/dL   Hgb urine dipstick LARGE (A) NEGATIVE   Bilirubin Urine NEGATIVE NEGATIVE   Ketones, ur NEGATIVE NEGATIVE mg/dL   Protein, ur 30 (A) NEGATIVE mg/dL   Nitrite NEGATIVE NEGATIVE   Leukocytes, UA MODERATE (A) NEGATIVE  Urine microscopic-add on   Collection Time: 08/29/15  1:00 PM  Result Value Ref Range   Squamous Epithelial / LPF 0-5 (A) NONE SEEN   WBC, UA 6-30 0 - 5 WBC/hpf   RBC / HPF TOO NUMEROUS TO COUNT 0 - 5 RBC/hpf   Bacteria, UA MANY (A) NONE SEEN   Urine-Other MUCOUS PRESENT   CBC   Collection Time: 08/29/15  1:40 PM  Result Value Ref Range   WBC 5.4 4.0 - 10.5 K/uL   RBC 3.66 (L) 3.87 - 5.11 MIL/uL   Hemoglobin 10.2 (L) 12.0 - 15.0 g/dL   HCT 78.2 (L) 95.6 - 21.3 %   MCV 85.2 78.0 - 100.0 fL   MCH 27.9 26.0 - 34.0 pg   MCHC 32.7 30.0 - 36.0 g/dL   RDW 08.6 57.8 - 46.9 %   Platelets 213 150 - 400 K/uL  Comprehensive metabolic panel   Collection Time: 08/29/15  1:40 PM  Result Value Ref Range   Sodium 135 135 - 145 mmol/L   Potassium 3.6 3.5 - 5.1 mmol/L   Chloride 107 101 - 111 mmol/L   CO2 21 (L) 22 - 32 mmol/L   Glucose, Bld 105 (H) 65 - 99 mg/dL   BUN 9 6 - 20 mg/dL   Creatinine, Ser 4.090.72 0.44 - 1.00 mg/dL   Calcium 9.0 8.9 - 81.110.3 mg/dL   Total Protein 6.9 6.5 - 8.1 g/dL   Albumin 3.0 (L) 3.5 - 5.0 g/dL   AST 17 15 - 41 U/L   ALT 20 14 - 54 U/L   Alkaline Phosphatase 59 38 - 126 U/L   Total Bilirubin 0.6 0.3 - 1.2 mg/dL   GFR calc non Af Amer >60 >60 mL/min   GFR calc Af Amer >60 >60 mL/min   Anion gap 7 5 - 15  Type and screen [facility]   Collection Time: 08/29/15   6:29 PM  Result Value Ref Range   ABO/RH(D) O POS    Antibody Screen NEG    Sample Expiration 09/01/2015   ABO/Rh   Collection Time: 08/29/15  6:29 PM  Result Value Ref Range   ABO/RH(D) O POS      Medications:  Scheduled . cefTRIAXone (ROCEPHIN)  IV  2 g Intravenous Q24H  . docusate sodium  100 mg Oral Daily  . ketorolac  30 mg Intravenous Q6H  . prenatal multivitamin  1 tablet Oral Q1200  . promethazine  12.5 mg Intravenous Once   I have reviewed the patient's current medications.  ASSESSMENT: Patient Active Problem List   Diagnosis Date Noted  . Pyelonephritis affecting pregnancy in third trimester, antepartum 08/29/2015  . Asymptomatic bacteriuria, antepartum 05/24/2015  . Previous cesarean section 05/06/2015  . Morbid obesity with BMI of 40.0-44.9, adult (HCC) 05/06/2015  . Supervision of low-risk pregnancy 04/25/2015  . Asthma 08/05/2011  . Dyspnea 07/07/2011  . Edema 07/07/2011    PLAN: Now febrile and having problem with pain management, SOB. CXR ordered and O2, continue rocephin for now  Mamadou Breon 08/30/2015,6:30 AM

## 2015-08-31 ENCOUNTER — Encounter (HOSPITAL_COMMUNITY): Payer: Self-pay | Admitting: *Deleted

## 2015-08-31 DIAGNOSIS — O2303 Infections of kidney in pregnancy, third trimester: Secondary | ICD-10-CM | POA: Diagnosis not present

## 2015-08-31 LAB — CBC
HEMATOCRIT: 25.5 % — AB (ref 36.0–46.0)
HEMOGLOBIN: 8.5 g/dL — AB (ref 12.0–15.0)
MCH: 28.7 pg (ref 26.0–34.0)
MCHC: 33.3 g/dL (ref 30.0–36.0)
MCV: 86.1 fL (ref 78.0–100.0)
Platelets: 161 10*3/uL (ref 150–400)
RBC: 2.96 MIL/uL — ABNORMAL LOW (ref 3.87–5.11)
RDW: 13.6 % (ref 11.5–15.5)
WBC: 7.8 10*3/uL (ref 4.0–10.5)

## 2015-08-31 LAB — URINE CULTURE

## 2015-08-31 LAB — BASIC METABOLIC PANEL
ANION GAP: 8 (ref 5–15)
BUN: 12 mg/dL (ref 6–20)
CALCIUM: 8.5 mg/dL — AB (ref 8.9–10.3)
CHLORIDE: 107 mmol/L (ref 101–111)
CO2: 18 mmol/L — AB (ref 22–32)
Creatinine, Ser: 1.09 mg/dL — ABNORMAL HIGH (ref 0.44–1.00)
GFR calc Af Amer: >60 mL/min (ref >60)
GFR calc non Af Amer: >60 mL/min (ref >60)
GLUCOSE: 101 mg/dL — AB (ref 65–99)
POTASSIUM: 3.9 mmol/L (ref 3.5–5.1)
Sodium: 133 mmol/L — ABNORMAL LOW (ref 135–145)

## 2015-08-31 MED ORDER — ALBUTEROL SULFATE (2.5 MG/3ML) 0.083% IN NEBU
3.0000 mL | INHALATION_SOLUTION | RESPIRATORY_TRACT | Status: DC | PRN
  Administered 2015-08-31: 3 mL via RESPIRATORY_TRACT
  Filled 2015-08-31: qty 3

## 2015-08-31 MED ORDER — BUTALBITAL-APAP-CAFFEINE 50-325-40 MG PO TABS
1.0000 | ORAL_TABLET | ORAL | Status: DC | PRN
  Administered 2015-08-31 – 2015-09-03 (×4): 1 via ORAL
  Filled 2015-08-31 (×2): qty 1
  Filled 2015-08-31: qty 2
  Filled 2015-08-31: qty 1

## 2015-08-31 MED ORDER — HYDROMORPHONE HCL 1 MG/ML IJ SOLN
1.0000 mg | INTRAMUSCULAR | Status: DC | PRN
  Administered 2015-08-31 – 2015-09-01 (×7): 1 mg via INTRAVENOUS
  Filled 2015-08-31 (×7): qty 1

## 2015-08-31 NOTE — Progress Notes (Signed)
O2 removed per pt request.  States she feels better.

## 2015-08-31 NOTE — Progress Notes (Addendum)
FACULTY PRACTICE ANTEPARTUM(COMPREHENSIVE) NOTE  [name] is a 30 y.o. G3P2002 at 6268w5d by LMP who is admitted for pyelonephritis.   Fetal presentation is unsure. Length of Stay:  2  Days  Subjective: Left flank pain.  Has SOB when she has chills.  No fevers.  Pain controlled with IV narcotics.  Starting to feel a little better - no more headaches. Patient reports the fetal movement as active. Patient reports uterine contraction  activity as none. Patient reports  vaginal bleeding as none. Patient describes fluid per vagina as None.  Vitals:  Blood pressure 120/73, pulse 111, temperature 98.6 F (37 C), temperature source Oral, resp. rate 28, height 5' 3.5" (1.613 m), weight 255 lb (115.667 kg), last menstrual period 02/03/2015, SpO2 96 %. Physical Examination:  General appearance - alert, well appearing, and in no distress, in mild to moderate distress and O2 mask in use Chest - clear to auscultation Heart - tachycardic and regular rhythm Abdomen - soft, nontender, nondistended Fundal Height:  size equals dates Cervical Exam: Not evaluated.. Extremities: extremities normal Membranes:intact  Fetal Monitoring:     140s, mod variability, accelerations.  No decelerations.  [place] Renal  08/29/2015  CLINICAL DATA:  Left-sided flank pain for 4 hours, third trimester pregnancy EXAM: RENAL / URINARY TRACT ULTRASOUND COMPLETE COMPARISON:  None. FINDINGS: Right Kidney: Length: 10.4 cm. Echogenicity within normal limits. No mass or hydronephrosis visualized. Left Kidney: Length: 14.0 cm. Echogenicity within normal limits. No mass or hydronephrosis visualized. Bladder: Not well visualized. IMPRESSION: No obstructive changes are noted. Electronically Signed   By: Alcide CleverMark  Lukens M.D.   On: 08/29/2015 16:09   Dg Chest Port 1 View  08/30/2015  CLINICAL DATA:  resp distress while in child birth EXAM: PORTABLE CHEST 1 VIEW COMPARISON:  04/22/2011 FINDINGS: Apical lordotic positioning. Midline  trachea. Borderline cardiomegaly, accentuated by AP portable technique. No pleural effusion or pneumothorax. Low lung volumes with resultant pulmonary interstitial prominence. No lobar consolidation. IMPRESSION: Borderline cardiomegaly and low lung volumes.  No acute findings. Electronically Signed   By: Jeronimo GreavesKyle  Caitlin M.D.   On: 08/30/2015 11:02    Labs:  Results for orders placed or performed during the hospital encounter of 08/29/15 (from the past 24 hour(s))  Lactic acid, plasma   Collection Time: 08/30/15  9:22 AM  Result Value Ref Range   Lactic Acid, Venous 1.8 0.5 - 2.0 mmol/L     Medications:  Scheduled . cefTRIAXone (ROCEPHIN)  IV  2 g Intravenous Q24H  . docusate sodium  100 mg Oral Daily  . prenatal multivitamin  1 tablet Oral Q1200  . promethazine  12.5 mg Intravenous Once   I have reviewed the patient's current medications.  ASSESSMENT: Patient Active Problem List   Diagnosis Date Noted  . Pyelonephritis affecting pregnancy in third trimester, antepartum 08/29/2015  . Asymptomatic bacteriuria, antepartum 05/24/2015  . Previous cesarean section 05/06/2015  . Morbid obesity with BMI of 40.0-44.9, adult (HCC) 05/06/2015  . Supervision of low-risk pregnancy 04/25/2015  . Asthma 08/05/2011  . Dyspnea 07/07/2011  . Edema 07/07/2011    PLAN: 1.  Pyelonephritis  Bacteria: Mirabilis Proteus  Continue Rocephin - last urine culture was sensitive to rocephin  Appears to be starting to improve  NST twice daily  NST reactive  CBC, BMP today  Orie Cuttino JEHIEL 08/31/2015,7:27 AM

## 2015-08-31 NOTE — Progress Notes (Signed)
Charting completed by Rolene Arbouresmaine Lewis, RN

## 2015-08-31 NOTE — Progress Notes (Signed)
Pt c/o feeling light headed while showering, dyspnea,  & having chills.  Assisited BTB, pulse oximeter applied, Sats 97-99 % on O2 2 lpm via NR mask (pt opts to not wear nasal cannula).  Lightheadedness resolved once BTB.  Pt breathing nonlabored, RR 28.  Color WNL, Pt reports these episodes have been occuring since admission & occur @ the same time each day.

## 2015-08-31 NOTE — Progress Notes (Addendum)
Increased to 12 lpm per respiratory considering pt using NRM

## 2015-09-01 ENCOUNTER — Other Ambulatory Visit (HOSPITAL_COMMUNITY): Payer: Medicaid Other

## 2015-09-01 ENCOUNTER — Inpatient Hospital Stay (HOSPITAL_COMMUNITY): Payer: Medicaid Other

## 2015-09-01 ENCOUNTER — Encounter (HOSPITAL_COMMUNITY): Payer: Self-pay

## 2015-09-01 DIAGNOSIS — N12 Tubulo-interstitial nephritis, not specified as acute or chronic: Secondary | ICD-10-CM

## 2015-09-01 DIAGNOSIS — O2303 Infections of kidney in pregnancy, third trimester: Principal | ICD-10-CM

## 2015-09-01 DIAGNOSIS — R0689 Other abnormalities of breathing: Secondary | ICD-10-CM

## 2015-09-01 LAB — BLOOD GAS, ARTERIAL
Acid-base deficit: 5.1 mmol/L — ABNORMAL HIGH (ref 0.0–2.0)
Bicarbonate: 17.3 mEq/L — ABNORMAL LOW (ref 20.0–24.0)
DRAWN BY: 132
O2 Content: 3 L/min
O2 Saturation: 99 %
PO2 ART: 93.9 mmHg (ref 80.0–100.0)
TCO2: 18.1 mmol/L (ref 0–100)
pCO2 arterial: 26.2 mmHg — ABNORMAL LOW (ref 35.0–45.0)
pH, Arterial: 7.433 (ref 7.350–7.450)

## 2015-09-01 LAB — BASIC METABOLIC PANEL
Anion gap: 7 (ref 5–15)
BUN: 11 mg/dL (ref 6–20)
CALCIUM: 8.2 mg/dL — AB (ref 8.9–10.3)
CO2: 19 mmol/L — AB (ref 22–32)
CREATININE: 1.29 mg/dL — AB (ref 0.44–1.00)
Chloride: 103 mmol/L (ref 101–111)
GFR calc non Af Amer: 55 mL/min — ABNORMAL LOW (ref >60)
Glucose, Bld: 126 mg/dL — ABNORMAL HIGH (ref 65–99)
Potassium: 3.8 mmol/L (ref 3.5–5.1)
SODIUM: 129 mmol/L — AB (ref 135–145)

## 2015-09-01 LAB — LACTIC ACID, PLASMA: LACTIC ACID, VENOUS: 0.6 mmol/L (ref 0.5–2.0)

## 2015-09-01 LAB — TROPONIN I: TROPONIN I: 0.03 ng/mL (ref <0.031)

## 2015-09-01 LAB — CBC
HCT: 27.2 % — ABNORMAL LOW (ref 36.0–46.0)
Hemoglobin: 9 g/dL — ABNORMAL LOW (ref 12.0–15.0)
MCH: 28.4 pg (ref 26.0–34.0)
MCHC: 33.1 g/dL (ref 30.0–36.0)
MCV: 85.8 fL (ref 78.0–100.0)
Platelets: 160 10*3/uL (ref 150–400)
RBC: 3.17 MIL/uL — ABNORMAL LOW (ref 3.87–5.11)
RDW: 13.8 % (ref 11.5–15.5)
WBC: 7.6 10*3/uL (ref 4.0–10.5)

## 2015-09-01 LAB — PROCALCITONIN
PROCALCITONIN: 0.87 ng/mL
PROCALCITONIN: 0.82 ng/mL

## 2015-09-01 LAB — STREP PNEUMONIAE URINARY ANTIGEN: STREP PNEUMO URINARY ANTIGEN: NEGATIVE

## 2015-09-01 LAB — BRAIN NATRIURETIC PEPTIDE: B Natriuretic Peptide: 128 pg/mL — ABNORMAL HIGH (ref 0.0–100.0)

## 2015-09-01 LAB — MRSA PCR SCREENING: MRSA BY PCR: NEGATIVE

## 2015-09-01 MED ORDER — PIPERACILLIN-TAZOBACTAM 3.375 G IVPB
3.3750 g | Freq: Three times a day (TID) | INTRAVENOUS | Status: DC
  Administered 2015-09-01 – 2015-09-03 (×6): 3.375 g via INTRAVENOUS
  Filled 2015-09-01 (×7): qty 50

## 2015-09-01 MED ORDER — HYDROMORPHONE HCL 1 MG/ML IJ SOLN
1.0000 mg | INTRAMUSCULAR | Status: DC | PRN
  Administered 2015-09-01 – 2015-09-04 (×17): 1 mg via INTRAVENOUS
  Filled 2015-09-01 (×17): qty 1

## 2015-09-01 MED ORDER — VANCOMYCIN HCL 10 G IV SOLR
2500.0000 mg | Freq: Once | INTRAVENOUS | Status: AC
  Administered 2015-09-01: 2500 mg via INTRAVENOUS
  Filled 2015-09-01: qty 2500

## 2015-09-01 MED ORDER — VANCOMYCIN HCL IN DEXTROSE 750-5 MG/150ML-% IV SOLN
750.0000 mg | Freq: Two times a day (BID) | INTRAVENOUS | Status: DC
  Administered 2015-09-01 – 2015-09-02 (×3): 750 mg via INTRAVENOUS
  Filled 2015-09-01 (×5): qty 150

## 2015-09-01 MED ORDER — IOPAMIDOL (ISOVUE-370) INJECTION 76%
100.0000 mL | Freq: Once | INTRAVENOUS | Status: AC | PRN
  Administered 2015-09-01: 100 mL via INTRAVENOUS

## 2015-09-01 NOTE — Progress Notes (Signed)
   09/01/15 0115  Vitals  Temp 98.7 F (37.1 C)  Pulse Rate (!) 116  Resp (!) 26  Oxygen Therapy  SpO2 90 %  O2 Device Room Air  Pt seen awake in bed, pt appears lethargic, skin clammy and warm RR 26 02 Sat 90% R/A. Pt was informed of finding and encouraged to resume 02. We initiated 02 @ 4L [place]. 02  Sat increased to 96 %. We will inform MD of same.

## 2015-09-01 NOTE — Progress Notes (Addendum)
AP Note Time: 0940 Pulmonary consulted and will come see; in the interim, d/c rocephin and add vanc/zosyn to cover for HCAP and will add on calcitonin and repeat lactate level (negative on 6/10). Can hold off on echo for now.   [name], Jr MD Attending [facility](Faculty Practice)

## 2015-09-01 NOTE — Progress Notes (Signed)
Echo at bedside

## 2015-09-01 NOTE — Progress Notes (Signed)
Patient ID: [name], female   DOB: 11/06/1985, 30 y.o.   MRN: [id number] FACULTY PRACTICE ANTEPARTUM(COMPREHENSIVE) NOTE  [name] is a 30 y.o. G3P2002 at [redacted]w[redacted]d by LMP who is admitted for pyelonephritis.   Fetal presentation is unsure. Length of Stay:  3  Days  Subjective: Persistent left flank pain.  She continues to have SOB when she has chills.  Tmax 101.5.  Pain controlled with IV narcotics.  Overall, patient states she feels much better and asked when she would be going home Patient reports the fetal movement as active. Patient reports uterine contraction  activity as none. Patient reports  vaginal bleeding as none. Patient describes fluid per vagina as None.  Vitals:  Blood pressure 116/70, pulse 108, temperature 98 F (36.7 C), temperature source Oral, resp. rate 24, height 5' 3.5" (1.613 m), weight 255 lb (115.667 kg), last menstrual period 02/03/2015, SpO2 100 %. Physical Examination:  General appearance - alert, well appearing, and in no distress, in mild to moderate distress and O2 mask in use Chest - clear to auscultation Heart - tachycardic and regular rhythm Abdomen - soft, nontender, gravid Fundal Height:  size equals dates Cervical Exam: Not evaluated.. Extremities: extremities normal Membranes:intact  Fetal Monitoring:     160s, mod variability, 10 x 10 accelerations.  No decelerations. Toco: no contractions  Ct Angio Chest Pe W/cm &/or Wo Cm  09/01/2015  CLINICAL DATA:  Shortness of breath, tachycardia, and tachypneic. Patient is pregnant. EXAM: CT ANGIOGRAPHY CHEST WITH CONTRAST TECHNIQUE: Multidetector CT imaging of the chest was performed using the standard protocol during bolus administration of intravenous contrast. Multiplanar CT image reconstructions and MIPs were obtained to evaluate the vascular anatomy. CONTRAST:  100 mL Isovue 370 COMPARISON:  04/04/2006 FINDINGS: Contrast bolus is limited. The main pulmonary arteries and proximal segmental  branches can be moderately well visualized. No large filling defects demonstrated in the visualize central pulmonary arteries. Segmental and subsegmental emboli cannot be excluded due to technique. Normal heart size. Normal caliber thoracic aorta. Great vessel origins are patent. Esophagus is decompressed. No significant lymphadenopathy in the chest. Motion artifact limits evaluation of the lungs. There are patchy infiltrative changes suggested in the lung bases. This may indicate atelectasis or pneumonia. Small bilateral pleural effusions. No pneumothorax. Airways appear patent. Included portions of the upper abdominal organs are grossly unremarkable. No destructive bone lesions. Review of the MIP images confirms the above findings. IMPRESSION: Technically limited study. No large central pulmonary emboli are identified but distal segmental and subsegmental branches are not well visualized. Small bilateral pleural effusions with patchy infiltration or atelectasis in the lung bases. This could indicate pneumonia in the appropriate clinical setting. Electronically Signed   By: Burman Nieves M.D.   On: 09/01/2015 02:36    Labs:  Results for orders placed or performed during the hospital encounter of 08/29/15 (from the past 24 hour(s))  CBC   Collection Time: 08/31/15  7:51 AM  Result Value Ref Range   WBC 7.8 4.0 - 10.5 K/uL   RBC 2.96 (L) 3.87 - 5.11 MIL/uL   Hemoglobin 8.5 (L) 12.0 - 15.0 g/dL   HCT 40.9 (L) 81.1 - 91.4 %   MCV 86.1 78.0 - 100.0 fL   MCH 28.7 26.0 - 34.0 pg   MCHC 33.3 30.0 - 36.0 g/dL   RDW 78.2 95.6 - 21.3 %   Platelets 161 150 - 400 K/uL  Basic metabolic panel   Collection Time: 08/31/15  7:51 AM  Result  Value Ref Range   Sodium 133 (L) 135 - 145 mmol/L   Potassium 3.9 3.5 - 5.1 mmol/L   Chloride 107 101 - 111 mmol/L   CO2 18 (L) 22 - 32 mmol/L   Glucose, Bld 101 (H) 65 - 99 mg/dL   BUN 12 6 - 20 mg/dL   Creatinine, Ser 0.861.09 (H) 0.44 - 1.00 mg/dL   Calcium 8.5 (L) 8.9  - 10.3 mg/dL   GFR calc non Af Amer >60 >60 mL/min   GFR calc Af Amer >60 >60 mL/min   Anion gap 8 5 - 15  CBC   Collection Time: 09/01/15  5:52 AM  Result Value Ref Range   WBC 7.6 4.0 - 10.5 K/uL   RBC 3.17 (L) 3.87 - 5.11 MIL/uL   Hemoglobin 9.0 (L) 12.0 - 15.0 g/dL   HCT 57.827.2 (L) 46.936.0 - 62.946.0 %   MCV 85.8 78.0 - 100.0 fL   MCH 28.4 26.0 - 34.0 pg   MCHC 33.1 30.0 - 36.0 g/dL   RDW 52.813.8 41.311.5 - 24.415.5 %   Platelets 160 150 - 400 K/uL  Basic metabolic panel   Collection Time: 09/01/15  5:52 AM  Result Value Ref Range   Sodium 129 (L) 135 - 145 mmol/L   Potassium 3.8 3.5 - 5.1 mmol/L   Chloride 103 101 - 111 mmol/L   CO2 19 (L) 22 - 32 mmol/L   Glucose, Bld 126 (H) 65 - 99 mg/dL   BUN 11 6 - 20 mg/dL   Creatinine, Ser 0.101.29 (H) 0.44 - 1.00 mg/dL   Calcium 8.2 (L) 8.9 - 10.3 mg/dL   GFR calc non Af Amer 55 (L) >60 mL/min   GFR calc Af Amer >60 >60 mL/min   Anion gap 7 5 - 15     Medications:  Scheduled . cefTRIAXone (ROCEPHIN)  IV  2 g Intravenous Q24H  . docusate sodium  100 mg Oral Daily  . prenatal multivitamin  1 tablet Oral Q1200  . promethazine  12.5 mg Intravenous Once   I have reviewed the patient's current medications.  ASSESSMENT: Patient Active Problem List   Diagnosis Date Noted  . Pyelonephritis affecting pregnancy in third trimester, antepartum 08/29/2015  . Asymptomatic bacteriuria, antepartum 05/24/2015  . Previous cesarean section 05/06/2015  . Morbid obesity with BMI of 40.0-44.9, adult (HCC) 05/06/2015  . Supervision of low-risk pregnancy 04/25/2015  . Asthma 08/05/2011  . Dyspnea 07/07/2011  . Edema 07/07/2011    PLAN: 1.  Pyelonephritis  Bacteria: Mirabilis Proteus sensitive to rocephin  CTPA negative for PE  Continue close monitoring  NST twice daily  NST reactive  CBC, BMP today  Geron Mulford 09/01/2015,7:00 AM

## 2015-09-01 NOTE — Progress Notes (Signed)
Pharmacy Antibiotic Note  [name] is a 30 y.o. female admitted on 08/29/2015 with suspected pneumonia.  Pharmacy has been consulted for Vancomycin & Zosyn dosing per pulmonary recommendation.  Plan: Vancomycin 2500 mg IV x1 followed by 750 mg IV Q 12 hr Vancomycin goal trough level 15-20 Zosyn 3.375 grams IV Q 8 hr Will follow renal function closely since SCr has increased over past few days. Will monitor vancomycin trough level at steady state (prior to 5th dose).  Height: 5' 3.5" (161.3 cm) Weight: 255 lb (115.667 kg) IBW/kg (Calculated) : 53.55    Temp (24hrs), Avg:99.1 F (37.3 C), Min:97.5 F (36.4 C), Max:101.5 F (38.6 C)   Recent Labs Lab 08/29/15 1340 08/30/15 0705 08/30/15 0922 08/31/15 0751 09/01/15 0552  WBC 5.4 11.6*  --  7.8 7.6  CREATININE 0.72 1.08*  --  1.09* 1.29*  LATICACIDVEN  --   --  1.8  --   --     Estimated Creatinine Clearance: 78.2 mL/min (by C-G formula based on Cr of 1.29).    Allergies  Allergen Reactions  . Sage [ZOXWRU[Salvia Officinalis] Swelling    Antimicrobials this admission: Ceftriaxone 08/29/15 >> 09/01/15 Zosyn 09/01/15 >>  Vancomycin 09/01/15 >>   Microbiology results: 08/31/15 BCx  x2: pending 08/29/15 UCx: Proteus mirabilis - sensitive to Zosyn, ceftriaxone, ampicillin, cefazolin    Thank you for allowing pharmacy to be a part of this patient's care.  Natasha BenceCline, Daeveon Zweber 09/01/2015 10:27 AM

## 2015-09-01 NOTE — Progress Notes (Signed)
Chest CT completed. Pt returned to her room and settled comfortable to bed. We will continue to monitor.

## 2015-09-01 NOTE — Consult Note (Signed)
PULMONARY / CRITICAL CARE MEDICINE   Name: [name] MRN: [id number] DOB: 10/06/1985    ADMISSION DATE:  08/29/2015 CONSULTATION DATE: 09/01/15  REFERRING MD:  Mitzi HansenPickens, C MD  CHIEF COMPLAINT:  Dyspnea  HISTORY OF PRESENT ILLNESS:   [name] is a 30 year old with past medical history of asthma, [redacted] weeks pregnant, G3,P2. She was admitted on 6/9 with left flank pain, fevers, found to have proteus urosepsis and treated with cetriaxone. She continues to be tachycardic with dyspnea. CTA done today does not show any PE, small bilateral effusions with associated atelectasis/consolidation. PCCM called for consultation.  She was seen in 2013 by Dr. Sherene SiresWert for asthma. PFTs at that time showed minimal obstruction, positive broncho-dilator response. She was treated with Detar [place] for a few months. However she is not currently on any long-term inhaler medication. She had a negative sleep study in 2015.  PAST MEDICAL HISTORY :  She  has a past medical history of Asthma; Seasonal allergies; Headache(784.0); and COPD (chronic obstructive pulmonary disease) (HCC).  PAST SURGICAL HISTORY: She  has past surgical history that includes Tonsillectomy; Wisdom tooth extraction; and Cesarean section.  Allergies  Allergen Reactions  . Earnestine LeysSage [Salvia Officinalis] Swelling    No current facility-administered medications on file prior to encounter.   Current Outpatient Prescriptions on File Prior to Encounter  Medication Sig  . cetirizine (ZYRTEC) 10 MG tablet Take 10 mg by mouth daily as needed for allergies.   . cyclobenzaprine (FLEXERIL) 10 MG tablet Take 1 tablet (10 mg total) by mouth every 8 (eight) hours as needed for muscle spasms.  . fluticasone (FLONASE) 50 MCG/ACT nasal spray Place 2 sprays into both nostrils daily as needed for allergies.   . Prenatal Vit-Fe Fumarate-FA (PRENATAL VITAMIN) 27-0.8 MG TABS Take 1 tablet by mouth daily.    FAMILY HISTORY:  Her is adopted.  SOCIAL HISTORY: She   reports that she quit smoking about 4 months ago. Her smoking use included Cigarettes. She has a 1.5 pack-year smoking history. She has never used smokeless tobacco. She reports that she does not drink alcohol or use illicit drugs.  REVIEW OF SYSTEMS:   Complains of headache, back pain, flank pain. Positive for dyspnea. Denies any cough, sputum production, wheezing. Denies any chest pain, palpitation. Denies any nausea, vomiting, diarrhea, constipation. All other review of systems are negative  SUBJECTIVE:   VITAL SIGNS: BP 145/88 mmHg  Pulse 134  Temp(Src) 99.8 F (37.7 C) (Oral)  Resp 30  Ht 5' 3.5" (1.613 m)  Wt 255 lb (115.667 kg)  BMI 44.46 kg/m2  SpO2 92%  LMP 02/03/2015  HEMODYNAMICS:    VENTILATOR SETTINGS:    INTAKE / OUTPUT: I/O last 3 completed shifts: In: 7870 [P.O.:2820; I.V.:5000; IV Piggyback:50] Out: 3150 [Urine:3150]  PHYSICAL EXAMINATION: General:  Mild distress Neuro:  Moves all 4 extremities, no gross focal deficits HEENT:  Moist mucous membranes, no thyromegaly, JVD Cardiovascular:  Tachycardia, regular. No MRG Lungs:  Tachypnea, No wheeze, crackles Abdomen:  Soft, +BS Musculoskeletal:  Normal tone and bulk Skin:  Intact  LABS:  BMET  Recent Labs Lab 08/30/15 0705 08/31/15 0751 09/01/15 0552  NA 133* 133* 129*  K 3.9 3.9 3.8  CL 106 107 103  CO2 20* 18* 19*  BUN 11 12 11   CREATININE 1.08* 1.09* 1.29*  GLUCOSE 113* 101* 126*    Electrolytes  Recent Labs Lab 08/30/15 0705 08/31/15 0751 09/01/15 0552  CALCIUM 8.8* 8.5* 8.2*    CBC  Recent Labs Lab  08/30/15 0705 08/31/15 0751 09/01/15 0552  WBC 11.6* 7.8 7.6  HGB 9.4* 8.5* 9.0*  HCT 28.8* 25.5* 27.2*  PLT 182 161 160    Coag's No results for input(s): APTT, INR in the last 168 hours.  Sepsis Markers  Recent Labs Lab 08/30/15 0922 09/01/15 0956  LATICACIDVEN 1.8 0.6    ABG  Recent Labs Lab 09/01/15 1006  PHART 7.433  PCO2ART 26.2*  PO2ART 93.9     Liver Enzymes  Recent Labs Lab 08/29/15 1340 08/30/15 0705  AST 17 19  ALT 20 20  ALKPHOS 59 57  BILITOT 0.6 1.1  ALBUMIN 3.0* 2.6*    Cardiac Enzymes No results for input(s): TROPONINI, PROBNP in the last 168 hours.  Glucose No results for input(s): GLUCAP in the last 168 hours.  Imaging Ct Angio Chest Pe W/cm &/or Wo Cm  09/01/2015  CLINICAL DATA:  Shortness of breath, tachycardia, and tachypneic. Patient is pregnant. EXAM: CT ANGIOGRAPHY CHEST WITH CONTRAST TECHNIQUE: Multidetector CT imaging of the chest was performed using the standard protocol during bolus administration of intravenous contrast. Multiplanar CT image reconstructions and MIPs were obtained to evaluate the vascular anatomy. CONTRAST:  100 mL Isovue 370 COMPARISON:  04/04/2006 FINDINGS: Contrast bolus is limited. The main pulmonary arteries and proximal segmental branches can be moderately well visualized. No large filling defects demonstrated in the visualize central pulmonary arteries. Segmental and subsegmental emboli cannot be excluded due to technique. Normal heart size. Normal caliber thoracic aorta. Great vessel origins are patent. Esophagus is decompressed. No significant lymphadenopathy in the chest. Motion artifact limits evaluation of the lungs. There are patchy infiltrative changes suggested in the lung bases. This may indicate atelectasis or pneumonia. Small bilateral pleural effusions. No pneumothorax. Airways appear patent. Included portions of the upper abdominal organs are grossly unremarkable. No destructive bone lesions. Review of the MIP images confirms the above findings. IMPRESSION: Technically limited study. No large central pulmonary emboli are identified but distal segmental and subsegmental branches are not well visualized. Small bilateral pleural effusions with patchy infiltration or atelectasis in the lung bases. This could indicate pneumonia in the appropriate clinical setting. Electronically  Signed   By: Burman Nieves M.D.   On: 09/01/2015 02:36     STUDIES:  CTA 09/01/15 > no PE, no infiltrate. Small bilateral effusion with associated atelectasis/consolidation. Images reviewed.  PFTs 5/50/13 FVC 2.61 (68%) FEV1 1.94 (62%) F/F 74 TLC 82% DLCO 73% Minimal obstruction with small airway disease, positive bronchodilator response. Mild restrictive defect  Sleep study 2015-negative for sleep apnea.  ABG 7.43/26/94/17/99%  CULTURES: Ucx 6/9 > proteus  ANTIBIOTICS: Ceftriaxone 6/9 > 6/12 Vanco 6/12 > Zosyn 6/12 >  SIGNIFICANT EVENTS:  LINES/TUBES:  DISCUSSION: 30 year old with history of asthma, [redacted] weeks pregnant. Admitted with urosepsis. PCCM called for ongoing dyspnea, tachypnea. ABG shows hyperventilation  CT scan reviewed. It is not really impressive for pneumonia however she does have small infiltrates at the bases. It would be reasonable to treat her for HCAP. She is at risk for ARDS but I don't see any evidence of that at the present. She may be in volume overload as she is 6 L positive I/O.   RECCS: - Agree with broad spectrum abx. Check procalcitonin - Repeat renal U/S already ordered. - Change IV fluids to [facility] as repeat LA is normal. - Check BNP and echo. Consider lasix - Pain control with dilaudid - No need for standing nebs as she is not wheezing  Chilton Greathouse MD [place]  Pulmonary and Critical Care Pager [phone] If no answer or after 3pm call: [phone] 09/01/2015, 11:32 AM

## 2015-09-01 NOTE — Progress Notes (Signed)
The CNM was updated on pt's medical condition. No new orders received. We will continue to monitor as we await CT chest results.

## 2015-09-01 NOTE — Clinical Documentation Improvement (Signed)
OB/GYN  Abnormal Lab/Test Results:  Sodium: 6/12: 129. 6/11: 133.  Possible Clinical Conditions associated with below indicators  Hyponatremia  Other Condition  Cannot Clinically Determine   Treatment Provided: 6/12: 0.9% NaCl @ 30 ml/hr.   Please exercise your independent, professional judgment when responding. A specific answer is not anticipated or expected.   Thank Caitlin DonovanYou,  [name] Health Information Management [place] [phone]

## 2015-09-01 NOTE — Progress Notes (Signed)
AP interval note  Interim events: s/p pulmonology consults with some labs negative and some rpt ones pending. B/l renal u/s still negative. Echo pending. ECG prelim with just sinus tach  S: pt feels better. Still no fevers, chills and pulmonary s/s are much improved. Temp:  [97.5 F (36.4 C)-101.5 F (38.6 C)] 99.9 F (37.7 C) (06/12 1152) Pulse Rate:  [85-144] 103 (06/12 1458) Resp:  [18-30] 22 (06/12 1152) BP: (103-145)/(53-88) 112/53 mmHg (06/12 1152) SpO2:  [89 %-100 %] 98 % (06/12 1458) I/O last 3 completed shifts: In: 7870 [P.O.:2820; I.V.:5000; IV Piggyback:50] Out: 3150 [Urine:3150] Total I/O In: 715 [I.V.:715] Out: 1630 [Urine:1630]  Intake/Output Summary (Last 24 hours) at 09/01/15 1603 Last data filed at 09/01/15 1400  Gross per 24 hour  Intake   3655 ml  Output   3405 ml  Net    250 ml     Current Vital Signs 24h Vital Sign Ranges  T 99.9 F (37.7 C) Temp  Avg: 99.2 F (37.3 C)  Min: 97.5 F (36.4 C)  Max: 101.5 F (38.6 C)  BP (!) 112/53 mmHg BP  Min: 103/53  Max: 145/88  HR (!) 103 Pulse  Avg: 112.7  Min: 85  Max: 144  RR (!) 22 Resp  Avg: 22.4  Min: 18  Max: 30  SaO2 98 % Not Delivered SpO2  Avg: 96.8 %  Min: 89 %  Max: 100 %       24 Hour I/O Current Shift I/O  Time Ins Outs 06/11 0701 - 06/12 0700 In: 5145 [P.O.:2220; I.V.:2875] Out: 2200 [Urine:2200] 06/12 0701 - 06/12 1900 In: 715 [I.V.:715] Out: 1630 [Urine:1630]     Patient Vitals for the past 6 hrs:  BP Temp Temp src Pulse Resp SpO2  09/01/15 1458 - - - (!) 103 - 98 %  09/01/15 1453 - - - (!) 103 - 97 %  09/01/15 1448 - - - (!) 103 - 98 %  09/01/15 1443 - - - (!) 107 - 97 %  09/01/15 1438 - - - (!) 103 - 97 %  09/01/15 1433 - - - (!) 110 - 98 %  09/01/15 1428 - - - (!) 109 - 97 %  09/01/15 1423 - - - (!) 108 - 97 %  09/01/15 1418 - - - (!) 109 - 98 %  09/01/15 1413 - - - (!) 112 - 98 %  09/01/15 1323 - - - (!) 127 - 94 %  09/01/15 1318 - - - (!) 115 - 94 %  09/01/15 1313 - - - (!)  118 - 95 %  09/01/15 1308 - - - (!) 120 - 93 %  09/01/15 1303 - - - (!) 127 - 94 %  09/01/15 1258 - - - (!) 125 - 93 %  09/01/15 1253 - - - (!) 121 - 94 %  09/01/15 1238 - - - (!) 125 - 93 %  09/01/15 1233 - - - (!) 122 - 96 %  09/01/15 1228 - - - (!) 124 - 97 %  09/01/15 1223 - - - (!) 128 - 97 %  09/01/15 1218 - - - (!) 127 - 96 %  09/01/15 1213 - - - (!) 127 - 97 %  09/01/15 1208 - - - (!) 128 - 97 %  09/01/15 1203 - - - (!) 130 - 98 %  09/01/15 1158 - - - (!) 130 - 93 %  09/01/15 1153 - - - (!) 128 - 97 %  09/01/15 1152 (!) 112/53 mmHg 99.9 F (37.7 C) Oral (!) 128 (!) 22 -  09/01/15 1148 - - - (!) 126 - 96 %  09/01/15 1143 - - - (!) 129 - 97 %  09/01/15 1138 - - - (!) 134 - 97 %  09/01/15 1133 - - - (!) 133 - 96 %  09/01/15 1128 - - - (!) 133 - 95 %  09/01/15 1123 - - - (!) 134 - 96 %  09/01/15 1118 - - - (!) 130 - 96 %  09/01/15 1113 - - - (!) 133 - 95 %  09/01/15 1108 - - - (!) 132 - 97 %  09/01/15 1103 - - - (!) 134 - 92 %  09/01/15 1053 - - - (!) 129 - 96 %  09/01/15 1048 - 99.8 F (37.7 C) Oral (!) 130 - 96 %  09/01/15 1043 - - - (!) 132 - 97 %  09/01/15 1038 - - - (!) 130 - 97 %  09/01/15 1033 - - - (!) 126 - 96 %  09/01/15 1028 - - - (!) 128 - 97 %  09/01/15 1023 - - - (!) 128 - 97 %  09/01/15 1018 - - - (!) 127 - 97 %  09/01/15 1013 - - - (!) 129 - 97 %  09/01/15 1008 - - - (!) 129 - 97 %   rNST today NAD No MRGs, nl s1 ans s2. Tachy to the low 100s CTAB, decreased WOB. [place] O2 in place  Negative: urine strep pneumo urinary Ag, procalcitonin, ABG, Lactic lacid, MRSA screen Pending: BCx x 2, legionella, BNP, tropnin I  A/P: Patient much improved Continue with current plan of care Follow up labs  Cornelia Copaharlie Yi Haugan, Jr MD Attending Center for [facility](Faculty Practice)

## 2015-09-01 NOTE — Progress Notes (Signed)
Ultrasound back at bedside

## 2015-09-01 NOTE — Progress Notes (Addendum)
Daily Antepartum Note  Admission Date: 08/29/2015 Current Date: 09/01/2015 9:26 AM  Talbot Grumblinglecia R Hinton is a 30 y.o. [id number] @ 894w0d, HD#4, admitted for left sided pyelo.  Pregnancy complicated by: Asthma, ?COPD, BMI 45, anemia, h/o recurrent UTIs, h/o VBAC x 1   Overnight/24hr events:  Patient with continued SOB. CT PE negative for emboli but small b/l pleural effusions with possible PNA.  Continued to have temp with last one at just past MN at 38.2. Most recent temps normal.   Subjective:  Patient with continued tachypnea and she states that when she has SOB she has occasional substernal chest pain. She says that she got a slight cough yesterday that is sometimes productive for yellow-greenish phlegm.   No OB s/s  Objective:    Intake/Output Summary (Last 24 hours) at 09/01/15 0926 Last data filed at 09/01/15 0800  Gross per 24 hour  Intake   4665 ml  Output   2675 ml  Net   1990 ml     Current Vital Signs 24h Vital Sign Ranges  T 98.8 F (37.1 C) Temp  Avg: 99.1 F (37.3 C)  Min: 97.5 F (36.4 C)  Max: 101.5 F (38.6 C)  BP (!) 145/88 mmHg BP  Min: 103/53  Max: 145/88  HR (!) 124 Pulse  Avg: 122.5  Min: 89  Max: 144  RR (!) 30 Resp  Avg: 22.6  Min: 18  Max: 30  SaO2 95 % Not Delivered SpO2  Avg: 96.3 %  Min: 89 %  Max: 100 %       24 Hour I/O Current Shift I/O  Time Ins Outs 06/11 0701 - 06/12 0700 In: 5145 [P.O.:2220; I.V.:2875] Out: 2200 [Urine:2200] 06/12 0701 - 06/12 1900 In: 250 [I.V.:250] Out: 475 [Urine:475]   Patient Vitals for the past 24 hrs:  BP Temp Temp src Pulse Resp SpO2  09/01/15 0903 - - - (!) 124 - 95 %  09/01/15 0858 - - - (!) 120 - 94 %  09/01/15 0853 - - - (!) 118 - 96 %  09/01/15 0848 - - - (!) 117 - 93 %  09/01/15 0843 - - - (!) 115 - 93 %  09/01/15 0838 - - - (!) 111 - 93 %  09/01/15 0833 - - - (!) 110 - 95 %  09/01/15 0828 - - - (!) 115 - 95 %  09/01/15 0824 (!) 145/88 mmHg 98.8 F (37.1 C) Axillary (!) 116 (!) 30 -  09/01/15 0823 -  97.7 F (36.5 C) Oral (!) 121 - 95 %  09/01/15 0813 - - - (!) 109 - 99 %  09/01/15 0800 - - - (!) 109 - 99 %  09/01/15 0700 - - - 98 - 100 %  09/01/15 0648 116/70 mmHg 98 F (36.7 C) Oral (!) 108 (!) 24 100 %  09/01/15 0500 - - - (!) 106 - 95 %  09/01/15 0400 - - - 89 - 98 %  09/01/15 0300 - - - 98 (!) 22 97 %  09/01/15 0240 - 97.9 F (36.6 C) Oral (!) 107 - 94 %  09/01/15 0123 - - - (!) 113 - 96 %  09/01/15 0115 - 98.7 F (37.1 C) - (!) 116 (!) 26 90 %  09/01/15 0022 - - - - 20 -  09/01/15 0020 - - - (!) 122 - 91 %  09/01/15 0015 - - - (!) 129 - 94 %  09/01/15 0010 - - - (!) 123 -  95 %  09/01/15 0005 - - - (!) 124 - 95 %  09/01/15 0003 - (!) 100.7 F (38.2 C) Oral - - -  09/01/15 0000 - - - (!) 121 - 95 %  08/31/15 2355 - - - (!) 122 - 94 %  08/31/15 2350 - - - (!) 125 - 90 %  08/31/15 2345 - - - (!) 127 - 90 %  08/31/15 2340 - - - (!) 126 - 91 %  08/31/15 2335 - - - (!) 128 - 90 %  08/31/15 2330 - - - (!) 128 - (!) 89 %  08/31/15 2325 - - - (!) 126 - 90 %  08/31/15 2320 - - - (!) 128 - 91 %  08/31/15 2315 - - - (!) 132 - 93 %  08/31/15 2310 - - - (!) 134 - 95 %  08/31/15 2305 - - - (!) 136 - 97 %  08/31/15 2300 - - - (!) 137 - 98 %  08/31/15 2253 - (!) 100.8 F (38.2 C) Oral - - -  08/31/15 2230 - - - (!) 132 - 100 %  08/31/15 2225 - - - (!) 132 - 100 %  08/31/15 2220 - - - (!) 134 - 100 %  08/31/15 2215 - - - (!) 134 - 100 %  08/31/15 2210 - - - (!) 134 - 100 %  08/31/15 2205 - - - (!) 140 - 98 %  08/31/15 2200 - - - (!) 139 - 98 %  08/31/15 2154 - - - (!) 144 - 97 %  08/31/15 2150 - - - (!) 140 - 97 %  08/31/15 2143 - - - (!) 140 - 99 %  08/31/15 2138 - 98.7 F (37.1 C) Oral (!) 136 - 97 %  08/31/15 2100 - - - - 18 -  08/31/15 2005 (!) 103/53 mmHg 97.5 F (36.4 C) Axillary (!) 110 18 -  08/31/15 1803 - 100.1 F (37.8 C) Axillary - - -  08/31/15 1623 118/70 mmHg (!) 101.5 F (38.6 C) Axillary (!) 127 (!) 22 96 %  08/31/15 1618 - - - (!) 129 - 96 %  08/31/15  1613 - - - (!) 130 - 97 %  08/31/15 1603 - - - (!) 122 - 100 %  08/31/15 1558 - - - (!) 122 - 100 %  08/31/15 1553 - - - (!) 122 - 100 %  08/31/15 1538 - - - (!) 124 - 100 %  08/31/15 1533 - - - (!) 125 - 100 %  08/31/15 1528 - - - (!) 124 - 100 %  08/31/15 1523 - - - (!) 123 - 100 %  08/31/15 1518 - - - (!) 131 - 92 %  08/31/15 1513 - - - (!) 130 - 94 %  08/31/15 1506 - - - (!) 124 - 100 %  08/31/15 1503 - - - (!) 124 - 100 %  08/31/15 1443 - - - (!) 121 - 100 %  08/31/15 1438 - - - (!) 120 - 100 %  08/31/15 1433 - - - (!) 121 - 100 %  08/31/15 1423 - - - (!) 122 - 99 %  08/31/15 1413 - - - (!) 119 - 100 %  08/31/15 1406 136/77 mmHg - - (!) 118 - -  08/31/15 1403 - - - (!) 116 - 98 %  08/31/15 1358 - 99.8 F (37.7 C) Axillary (!) 117 (!) 28 98 %  08/31/15 1217 Marland Kitchen)  108/55 mmHg 97.7 F (36.5 C) Axillary (!) 109 18 -  08/31/15 1030 - 99 F (37.2 C) Axillary - - -    Physical exam: General: Well nourished, well developed female in no acute distress. Abdomen: gravid, obese, nttp Back: no CVAT b/l. Pt notes some discomfort with palpation on the upper left side Cardiovascular: tachy to the 120s, no MRGs Respiratory: CTAB but difficult due to increased work of breathing. Crane on and patient with some nostril flaring Extremities: no clubbing, cyanosis or edema Skin: Warm and dry.   Medications: Current Facility-Administered Medications  Medication Dose Route Frequency Provider Last Rate Last Dose  . 0.9 %  sodium chloride infusion   Intravenous Continuous Hurshel Party, CNM 125 mL/hr at 09/01/15 0646    . acetaminophen (TYLENOL) tablet 650 mg  650 mg Oral Q4H PRN Hurshel Party, CNM   650 mg at 08/31/15 2303  . albuterol (PROVENTIL) (2.5 MG/3ML) 0.083% nebulizer solution 3 mL  3 mL Inhalation Q4H PRN Peggy Constant, MD   3 mL at 08/31/15 1434  . butalbital-acetaminophen-caffeine (FIORICET, ESGIC) 50-325-40 MG per tablet 1 tablet  1 tablet Oral Q4H PRN Catalina Antigua, MD    1 tablet at 08/31/15 2154  . calcium carbonate (TUMS - dosed in mg elemental calcium) chewable tablet 400 mg of elemental calcium  2 tablet Oral Q4H PRN Hurshel Party, CNM      . cefTRIAXone (ROCEPHIN) 2 g in dextrose 5 % 50 mL IVPB  2 g Intravenous Q24H Wilmer Floor Leftwich-Kirby, CNM   2 g at 08/31/15 1803  . cyclobenzaprine (FLEXERIL) tablet 10 mg  10 mg Oral TID PRN Adam Phenix, MD   10 mg at 08/31/15 2154  . diphenhydrAMINE (BENADRYL) capsule 25 mg  25 mg Oral QHS PRN Adam Phenix, MD      . docusate sodium (COLACE) capsule 100 mg  100 mg Oral Daily Lisa A Leftwich-Kirby, CNM   100 mg at 08/31/15 1021  . HYDROmorphone (DILAUDID) injection 1 mg  1 mg Intravenous Q3H PRN Rhona Raider Stinson, DO   1 mg at 09/01/15 0819  . HYDROmorphone (DILAUDID) tablet 1 mg  1 mg Oral Q3H PRN Rhona Raider Stinson, DO   1 mg at 08/30/15 1800  . prenatal multivitamin tablet 1 tablet  1 tablet Oral Q1200 Hurshel Party, CNM   1 tablet at 08/31/15 1218  . promethazine (PHENERGAN) injection 12.5 mg  12.5 mg Intravenous Once Adam Phenix, MD      . promethazine (PHENERGAN) injection 25 mg  25 mg Intravenous Q6H PRN Hurshel Party, CNM   25 mg at 08/31/15 0334  . zolpidem (AMBIEN) tablet 5 mg  5 mg Oral QHS PRN Hurshel Party, CNM        Labs:   Recent Labs Lab 08/30/15 [phone] 08/31/15 0751 09/01/15 0552  WBC 11.6* 7.8 7.6  HGB 9.4* 8.5* 9.0*  HCT 28.8* 25.5* 27.2*  PLT 182 161 160   CMP Latest Ref Rng 09/01/2015 08/31/2015 08/30/2015  Glucose 65 - 99 mg/dL 960(A) 540(J) 811(B)  BUN 6 - 20 mg/dL 11 12 11   Creatinine 0.44 - 1.00 mg/dL 1.47(W) 2.95(A) 2.13(Y)  Sodium 135 - 145 mmol/L 129(L) 133(L) 133(L)  Potassium 3.5 - 5.1 mmol/L 3.8 3.9 3.9  Chloride 101 - 111 mmol/L 103 107 106  CO2 22 - 32 mmol/L 19(L) 18(L) 20(L)  Calcium 8.9 - 10.3 mg/dL 8.2(L) 8.5(L) 8.8(L)  Total Protein 6.5 - 8.1 g/dL - -  6.2(L)  Total Bilirubin 0.3 - 1.2 mg/dL - - 1.1  Alkaline Phos 38 - 126 U/L - - 57   AST 15 - 41 U/L - - 19  ALT 14 - 54 U/L - - 20   UCx: >100k proteus susceptible to rocephin  BCx x 2: pending  Radiology: as above  Assessment & Plan:  Pt stable with possible worsening of pulmonary s/s *IUP: no s/s of fetal distress. Follow up qday NSTs *Pulm: in the room the patient goes down to the low 90s on RA but with [place] stays >96%.  Given non improving and possible worsening pulmonary status and with worsening pyelo can get ARDS type picture. Chart notes COPD but no pulmonary notes or evaluation for several years with negative 01/2014 sleep study -Will consult pulmonary. In the interim, will leave on [place], get venous blood gas, contact precautions *Renal: Cr bumped and still having temps with last one at just after MN but afebrile since then. Will repeat renal u/s to ensure no change; it was negative b/l on admission. Consider Urology consult if Cr continues to increase, +renal u/s and/or sx that s/s are not due to pulmonary process. Continue with IV rocephin, currently D#4 *CV: ECG ordered and will d/w pulmonary re: utility of echo. Follow up BPs to ensure they aren't increasing b/c if so then concern for severe pre-eclampsia (pulmonary, renal and BP issues). If Sx for severe pre-eclampsia then will need echo and plan for immediate delivery *Anemia: stable; unlikely to be causing s/s. Start PO iron once patient is clinically improving.  *FEN/GI: regular diet. On MIVF of NS. Continue to follow sodium. Pt with good UOP *PPx:OOB with assistance, SCDs *Dispo: pending resolution of pulmonary and renal s/s.   Cornelia Copa MD Attending Center for [facility])

## 2015-09-01 NOTE — Progress Notes (Signed)
*  PRELIMINARY RESULTS* Echocardiogram 2D Echocardiogram has been performed.  Jeryl Columbialliott, Gaylene Moylan 09/01/2015, 5:05 PM

## 2015-09-01 NOTE — Clinical Documentation Improvement (Signed)
OB/GYN  Can the diagnosis of Urosepsis be further specified?   Sepsis - specify causative organism if known  Determine if there is Severe Sepsis (Sepsis with organ dysfunction - specify), Septic Shock if present  Identify etiology of Sepsis - Device, Implant, Graft, Infusion, Abortion  Specify organ dysfunction - Respiratory Failure, Encephalopathy, Acute Kidney Failure, Pneumonia, UTI, Other (specify), Unable to Clinically Determine}  Other  Clinically Undetermined  Document any associated diagnoses/conditions.   Supporting Information: Patient found to have proteus urosepsis and treated with ceftriaxone per 6/12 progress notes.   Please exercise your independent, professional judgment when responding. A specific answer is not anticipated or expected.   Thank Sabino DonovanYou,  Linah Klapper [facility] [place] [phone]

## 2015-09-02 LAB — BASIC METABOLIC PANEL
ANION GAP: 6 (ref 5–15)
BUN: 11 mg/dL (ref 6–20)
CALCIUM: 8.3 mg/dL — AB (ref 8.9–10.3)
CO2: 21 mmol/L — ABNORMAL LOW (ref 22–32)
Chloride: 106 mmol/L (ref 101–111)
Creatinine, Ser: 1.15 mg/dL — ABNORMAL HIGH (ref 0.44–1.00)
GLUCOSE: 99 mg/dL (ref 65–99)
POTASSIUM: 3.5 mmol/L (ref 3.5–5.1)
SODIUM: 133 mmol/L — AB (ref 135–145)

## 2015-09-02 LAB — ECHOCARDIOGRAM COMPLETE
EERAT: 8.16
EWDT: 214 ms
FS: 41 % (ref 28–44)
HEIGHTINCHES: 63.5 in
IVS/LV PW RATIO, ED: 0.93
LA diam index: 1.78 cm/m2
LA vol A4C: 48.3 ml
LA vol index: 27 mL/m2
LASIZE: 38 mm
LAVOL: 57.8 mL
LEFT ATRIUM END SYS DIAM: 38 mm
LVEEAVG: 8.16
LVEEMED: 8.16
LVELAT: 13.6 cm/s
LVOT area: 3.14 cm2
LVOT diameter: 20 mm
MV Dec: 214
MVPG: 5 mmHg
MVPKAVEL: 94.6 m/s
MVPKEVEL: 111 m/s
PW: 13.3 mm — AB (ref 0.6–1.1)
RV TAPSE: 27.7 mm
Reg peak vel: 311 cm/s
TDI e' lateral: 13.6
TDI e' medial: 9.14
TRMAXVEL: 311 cm/s
WEIGHTICAEL: 4080 [oz_av]

## 2015-09-02 LAB — CBC WITH DIFFERENTIAL/PLATELET
BASOS ABS: 0 10*3/uL (ref 0.0–0.1)
BASOS PCT: 0 %
Eosinophils Absolute: 0.1 10*3/uL (ref 0.0–0.7)
Eosinophils Relative: 1 %
HEMATOCRIT: 23.1 % — AB (ref 36.0–46.0)
HEMOGLOBIN: 7.7 g/dL — AB (ref 12.0–15.0)
LYMPHS PCT: 14 %
Lymphs Abs: 1 10*3/uL (ref 0.7–4.0)
MCH: 28 pg (ref 26.0–34.0)
MCHC: 33.3 g/dL (ref 30.0–36.0)
MCV: 84 fL (ref 78.0–100.0)
Monocytes Absolute: 0.5 10*3/uL (ref 0.1–1.0)
Monocytes Relative: 7 %
NEUTROS ABS: 5.5 10*3/uL (ref 1.7–7.7)
NEUTROS PCT: 78 %
Platelets: 177 10*3/uL (ref 150–400)
RBC: 2.75 MIL/uL — AB (ref 3.87–5.11)
RDW: 13.9 % (ref 11.5–15.5)
WBC: 7.1 10*3/uL (ref 4.0–10.5)

## 2015-09-02 LAB — LEGIONELLA PNEUMOPHILA SEROGP 1 UR AG: L. PNEUMOPHILA SEROGP 1 UR AG: NEGATIVE

## 2015-09-02 LAB — PROCALCITONIN: PROCALCITONIN: 0.48 ng/mL

## 2015-09-02 LAB — TROPONIN I: Troponin I: <0.03 ng/mL (ref <0.031)

## 2015-09-02 MED ORDER — DOCUSATE SODIUM 100 MG PO CAPS
100.0000 mg | ORAL_CAPSULE | Freq: Every day | ORAL | Status: DC
  Administered 2015-09-02 – 2015-09-04 (×3): 100 mg via ORAL
  Filled 2015-09-02 (×3): qty 1

## 2015-09-02 MED ORDER — FERROUS GLUCONATE 324 (38 FE) MG PO TABS
324.0000 mg | ORAL_TABLET | Freq: Two times a day (BID) | ORAL | Status: DC
Start: 2015-09-02 — End: 2015-09-04
  Administered 2015-09-02 – 2015-09-04 (×5): 324 mg via ORAL
  Filled 2015-09-02 (×7): qty 1

## 2015-09-02 NOTE — Progress Notes (Signed)
This note also relates to the following rows which could not be included: Pulse Rate - Cannot attach notes to unvalidated device data SpO2 - Cannot attach notes to unvalidated device data   

## 2015-09-02 NOTE — Consult Note (Signed)
PULMONARY / CRITICAL CARE MEDICINE   Name: [name] MRN: [id number] DOB: 11-29-1985    ADMISSION DATE:  08/29/2015 CONSULTATION DATE: 09/01/15  REFERRING MD:  Mitzi Hansen MD  CHIEF COMPLAINT:  Dyspnea  HISTORY OF PRESENT ILLNESS:   Mrs. [name] is a 30 year old with past medical history of asthma, [redacted] weeks pregnant, G3,P2. She was admitted on 6/9 with left flank pain, fevers, found to have proteus urosepsis and treated with cetriaxone. She continues to be tachycardic with dyspnea. CTA done today does not show any PE, small bilateral effusions with associated atelectasis/consolidation. PCCM called for consultation.  She was seen in 2013 by Dr. Sherene Howard for asthma. PFTs at that time showed minimal obstruction, positive broncho-dilator response. She was treated with [facility] for a few months. However she is not currently on any long-term inhaler medication. She had a negative sleep study in 2015.  PAST MEDICAL HISTORY :  She  has a past medical history of Asthma; Seasonal allergies; Headache(784.0); and COPD (chronic obstructive pulmonary disease) (HCC).  PAST SURGICAL HISTORY: She  has past surgical history that includes Tonsillectomy; Wisdom tooth extraction; and Cesarean section.  Allergies  Allergen Reactions  . [name] Officinalis] Swelling    No current facility-administered medications on file prior to encounter.   Current Outpatient Prescriptions on File Prior to Encounter  Medication Sig  . cetirizine (ZYRTEC) 10 MG tablet Take 10 mg by mouth daily as needed for allergies.   . cyclobenzaprine (FLEXERIL) 10 MG tablet Take 1 tablet (10 mg total) by mouth every 8 (eight) hours as needed for muscle spasms.  . fluticasone (FLONASE) 50 MCG/ACT nasal spray Place 2 sprays into both nostrils daily as needed for allergies.   . Prenatal Vit-Fe Fumarate-FA (PRENATAL VITAMIN) 27-0.8 MG TABS Take 1 tablet by mouth daily.    FAMILY HISTORY:  Her is adopted.  SOCIAL HISTORY: She   reports that she quit smoking about 4 months ago. Her smoking use included Cigarettes. She has a 1.5 pack-year smoking history. She has never used smokeless tobacco. She reports that she does not drink alcohol or use illicit drugs.  REVIEW OF SYSTEMS:   Complains of headache, back pain, flank pain. Positive for dyspnea. Denies any cough, sputum production, wheezing. Denies any chest pain, palpitation. Denies any nausea, vomiting, diarrhea, constipation. All other review of systems are negative  SUBJECTIVE:  Feels better. Tachypnea is improved  VITAL SIGNS: BP 97/56 mmHg  Pulse 96  Temp(Src) 97.2 F (36.2 C) (Axillary)  Resp 22  Ht 5' 3.5" (1.613 m)  Wt 255 lb (115.667 kg)  BMI 44.46 kg/m2  SpO2 99%  LMP 02/03/2015  HEMODYNAMICS:    VENTILATOR SETTINGS:    INTAKE / OUTPUT: I/O last 3 completed shifts: In: 4182.9 [P.O.:1320; I.V.:2562.9; IV Piggyback:300] Out: 4305 [Urine:4305]  PHYSICAL EXAMINATION: General:  No distress Neuro:  Moves all 4 extremities, no gross focal deficits HEENT:  Moist mucous membranes, no thyromegaly, JVD Cardiovascular:  Tachycardia, regular. No MRG Lungs:  No wheeze, crackles Abdomen:  Soft, +BS Musculoskeletal:  Normal tone and bulk Skin:  Intact  LABS:  BMET  Recent Labs Lab 08/31/15 0751 09/01/15 0552 09/02/15 0503  NA 133* 129* 133*  K 3.9 3.8 3.5  CL 107 103 106  CO2 18* 19* 21*  BUN CREATININE 1.09* 1.29* 1.15*  GLUCOSE 101* 126* 99    Electrolytes  Recent Labs Lab 08/31/15 0751 09/01/15 0552 09/02/15 0503  CALCIUM 8.5* 8.2* 8.3*    CBC  Recent Labs Lab 08/31/15 0751 09/01/15 0552 09/02/15 0503  WBC 7.8 7.6 7.1  HGB 8.5* 9.0* 7.7*  HCT 25.5* 27.2* 23.1*  PLT 161 160 177    Coag's No results for input(s): APTT, INR in the last 168 hours.  Sepsis Markers  Recent Labs Lab 08/30/15 0922 09/01/15 0956 09/01/15 1250 09/02/15 0503  LATICACIDVEN 1.8 0.6  --   --   PROCALCITON  --  0.87  0.82 0.48    ABG  Recent Labs Lab 09/01/15 1006  PHART 7.433  PCO2ART 26.2*  PO2ART 93.9    Liver Enzymes  Recent Labs Lab 08/29/15 1340 08/30/15 0705  AST 17 19  ALT 20 20  ALKPHOS 59 57  BILITOT 0.6 1.1  ALBUMIN 3.0* 2.6*    Cardiac Enzymes  Recent Labs Lab 09/01/15 1250 09/01/15 1906 09/02/15 0054  TROPONINI 0.03 <0.03 <0.03    Glucose No results for input(s): GLUCAP in the last 168 hours.  Imaging [place] Renal  09/01/2015  CLINICAL DATA:  Pyelonephritis affecting third trimester of pregnancy, [redacted] weeks pregnant, BILATERAL flank pain, obesity, fever EXAM: RENAL / URINARY TRACT ULTRASOUND COMPLETE COMPARISON:  08/29/2015 FINDINGS: Right Kidney: Length: 13.3 cm. Normal cortical thickness. Upper normal cortical echogenicity. Moderate renal collecting system dilatation, renal pelvis 15 mm diameter. After voiding, renal pelvis decreased to 10 mm diameter. No mass or shadowing calcification. Left Kidney: Length: 14.2 cm. Normal cortical thickness. Upper normal cortical echogenicity. Moderate renal collecting system dilatation, renal pelvis 15 mm diameter. Renal pelvis did not decompress following voiding. No mass or shadowing calcification. Bladder: Well distended.  BILATERAL ureteral jets noted. IMPRESSION: Dilatation of the renal collecting systems bilaterally with both renal pelves measuring 15 mm diameter initially, with RIGHT side decompressing after voiding but LEFT side remaining unchanged after voiding. However, ureteral jets are seen bilaterally indicating at least partial patency of both ureters. Electronically Signed   By: Ulyses SouthwardMark  Boles M.D.   On: 09/01/2015 14:38     STUDIES:  CTA 09/01/15 > no PE, no infiltrate. Small bilateral effusion with associated atelectasis/consolidation. Images reviewed.  PFTs 5/50/13 FVC 2.61 (68%) FEV1 1.94 (62%) F/F 74 TLC 82% DLCO 73% Minimal obstruction with small airway disease, positive bronchodilator response. Mild restrictive  defect  Sleep study 2015-negative for sleep apnea.  ABG 7.43/26/94/17/99%  CULTURES: Ucx 6/9 > proteus  ANTIBIOTICS: Ceftriaxone 6/9 > 6/12 Vanco 6/12 > Zosyn 6/12 >  SIGNIFICANT EVENTS:  LINES/TUBES:  DISCUSSION: 30 year old with history of asthma, [redacted] weeks pregnant. Admitted with urosepsis. PCCM called for ongoing dyspnea, tachypnea. ABG shows hyperventilation  CT scan reviewed. It is not really impressive for pneumonia however she does have small infiltrates at the bases. Pt is diuresing well. Pct is improving  RECCS: - Continue abx. Can narrow back to ceftriaxone tomorrow if Pct continues to improve.  - Follow echocardiogram - Pain control with dilaudid - Albuterol Nebs PRN  Chilton GreathousePraveen Dezaria Methot MD [facility] Pager [phone] If no answer or after 3pm call: [phone] 09/02/2015, 9:53 AM

## 2015-09-02 NOTE — Progress Notes (Addendum)
Daily Antepartum Note  Admission Date: 08/29/2015 Current Date: 09/02/2015 7:41 AM  [name] is a 30 y.o. [id number] @ [redacted]w[redacted]d, HD#5, admitted for left sided pyelo.  Pregnancy complicated by: Asthma, ?COPD, BMI 45, anemia, h/o recurrent UTIs, h/o VBAC x 1   Overnight/24hr events:  None  Subjective:  Patient continues to feel better and with just some upper back discomfort. No chest pain, SOB, nausea, vomiting, cough, fevers or chills.  No OB s/s  Objective:    Intake/Output Summary (Last 24 hours) at 09/02/15 0741 Last data filed at 09/02/15 0500  Gross per 24 hour  Intake   2065 ml  Output   3305 ml  Net  -1240 ml    Current Vital Signs 24h Vital Sign Ranges  T 97.9 F (36.6 C) Temp  Avg: 98.3 F (36.8 C)  Min: 96.5 F (35.8 C)  Max: 99.9 F (37.7 C)  BP (!) 110/44 mmHg BP  Min: 110/44  Max: 145/88  HR 97 Pulse  Avg: 111.2  Min: 86  Max: 134  RR 20 Resp  Avg: 21.7  Min: 18  Max: 30  SaO2 96 % Not Delivered SpO2  Avg: 96.7 %  Min: 87 %  Max: 100 %       24 Hour I/O Current Shift I/O  Time Ins Outs 06/12 0701 - 06/13 0700 In: 2065 [P.O.:600; I.V.:1165] Out: 3305 [Urine:3305]     Patient Vitals for the past 24 hrs:  BP Temp Temp src Pulse Resp SpO2  09/02/15 0700 - - - 97 - 96 %  09/02/15 0600 - - - 98 - 100 %  09/02/15 0515 (!) 110/44 mmHg 97.9 F (36.6 C) Oral 100 20 99 %  09/02/15 0500 - - - 98 - 99 %  09/02/15 0400 - - - (!) 101 - 98 %  09/02/15 0300 - - - (!) 102 - 98 %  09/02/15 0230 - - - 98 - 98 %  09/02/15 0100 - - - (!) 102 - 97 %  09/02/15 0000 - - - (!) 108 - (!) 87 %  09/01/15 2347 - - - (!) 113 - (!) 89 %  09/01/15 2300 - - - (!) 109 - 93 %  09/01/15 2200 - - - - (!) 22 -  09/01/15 2100 - - - (!) 117 - 94 %  09/01/15 1937 130/67 mmHg (!) 96.5 F (35.8 C) Axillary 96 18 -  09/01/15 1903 - - - 88 - 100 %  09/01/15 1858 - - - (!) 106 - 94 %  09/01/15 1843 - - - 99 - 94 %  09/01/15 1838 - - - 94 - 97 %  09/01/15 1833 - - - 92 - 98 %   09/01/15 1828 - - - 95 - 98 %  09/01/15 1823 - - - 94 - 97 %  09/01/15 1818 - - - 86 - 99 %  09/01/15 1813 - - - 91 - 100 %  09/01/15 1808 - - - 93 - 99 %  09/01/15 1803 - - - 92 - 99 %  09/01/15 1758 - - - 89 - 99 %  09/01/15 1753 - - - 94 - 100 %  09/01/15 1748 - - - 92 - 99 %  09/01/15 1743 - - - 98 - 98 %  09/01/15 1738 - - - 96 - 99 %  09/01/15 1733 - - - 96 - 99 %  09/01/15 1728 - - - 95 - 98 %  09/01/15 1723 - - - 92 - 98 %  09/01/15 1718 - - - 95 - 99 %  09/01/15 1713 - - - 89 - 98 %  09/01/15 1708 - - - 95 - 99 %  09/01/15 1703 - - - 96 - 98 %  09/01/15 1658 - - - 99 - 96 %  09/01/15 1648 - - - 94 - 99 %  09/01/15 1643 - - - 96 - 98 %  09/01/15 1638 - - - 95 - 98 %  09/01/15 1633 - - - 96 - 99 %  09/01/15 1628 - - - 99 - 98 %  09/01/15 1624 - - - 98 - -  09/01/15 1623 117/71 mmHg 97.4 F (36.3 C) Oral 100 18 98 %  09/01/15 1618 - - - (!) 102 - 99 %  09/01/15 1613 - - - (!) 104 - 99 %  09/01/15 1608 - - - 99 - 98 %  09/01/15 1603 - - - 95 - 98 %  09/01/15 1558 - - - (!) 101 - 99 %  09/01/15 1553 - - - 99 - 98 %  09/01/15 1458 - - - (!) 103 - 98 %  09/01/15 1453 - - - (!) 103 - 97 %  09/01/15 1448 - - - (!) 103 - 98 %  09/01/15 1443 - - - (!) 107 - 97 %  09/01/15 1438 - - - (!) 103 - 97 %  09/01/15 1433 - - - (!) 110 - 98 %  09/01/15 1428 - - - (!) 109 - 97 %  09/01/15 1423 - - - (!) 108 - 97 %  09/01/15 1418 - - - (!) 109 - 98 %  09/01/15 1413 - - - (!) 112 - 98 %  09/01/15 1323 - - - (!) 127 - 94 %  09/01/15 1318 - - - (!) 115 - 94 %  09/01/15 1313 - - - (!) 118 - 95 %  09/01/15 1308 - - - (!) 120 - 93 %  09/01/15 1303 - - - (!) 127 - 94 %  09/01/15 1258 - - - (!) 125 - 93 %  09/01/15 1253 - - - (!) 121 - 94 %  09/01/15 1238 - - - (!) 125 - 93 %  09/01/15 1233 - - - (!) 122 - 96 %  09/01/15 1228 - - - (!) 124 - 97 %  09/01/15 1223 - - - (!) 128 - 97 %  09/01/15 1218 - - - (!) 127 - 96 %  09/01/15 1213 - - - (!) 127 - 97 %  09/01/15 1208 - - - (!) 128  - 97 %  09/01/15 1203 - - - (!) 130 - 98 %  09/01/15 1158 - - - (!) 130 - 93 %  09/01/15 1153 - - - (!) 128 - 97 %  09/01/15 1152 (!) 112/53 mmHg 99.9 F (37.7 C) Oral (!) 128 (!) 22 -  09/01/15 1148 - - - (!) 126 - 96 %  09/01/15 1143 - - - (!) 129 - 97 %  09/01/15 1138 - - - (!) 134 - 97 %  09/01/15 1133 - - - (!) 133 - 96 %  09/01/15 1128 - - - (!) 133 - 95 %  09/01/15 1123 - - - (!) 134 - 96 %  09/01/15 1118 - - - (!) 130 - 96 %  09/01/15 1113 - - - (!) 133 - 95 %  09/01/15 1108 - - - Marland Kitchen)  132 - 97 %  09/01/15 1103 - - - (!) 134 - 92 %  09/01/15 1053 - - - (!) 129 - 96 %  09/01/15 1048 - 99.8 F (37.7 C) Oral (!) 130 - 96 %  09/01/15 1043 - - - (!) 132 - 97 %  09/01/15 1038 - - - (!) 130 - 97 %  09/01/15 1033 - - - (!) 126 - 96 %  09/01/15 1028 - - - (!) 128 - 97 %  09/01/15 1023 - - - (!) 128 - 97 %  09/01/15 1018 - - - (!) 127 - 97 %  09/01/15 1013 - - - (!) 129 - 97 %  09/01/15 1008 - - - (!) 129 - 97 %  09/01/15 0953 - - - (!) 127 - 97 %  09/01/15 0948 - - - (!) 124 - 97 %  09/01/15 0943 - - - (!) 123 - 97 %  09/01/15 0938 - - - (!) 122 - 97 %  09/01/15 0933 - - - (!) 121 - 96 %  09/01/15 0903 - - - (!) 124 - 95 %  09/01/15 0858 - - - (!) 120 - 94 %  09/01/15 0853 - - - (!) 118 - 96 %  09/01/15 0848 - - - (!) 117 - 93 %  09/01/15 0843 - - - (!) 115 - 93 %  09/01/15 0838 - - - (!) 111 - 93 %  09/01/15 0833 - - - (!) 110 - 95 %  09/01/15 0828 - - - (!) 115 - 95 %  09/01/15 0824 (!) 145/88 mmHg 98.8 F (37.1 C) Axillary (!) 116 (!) 30 -  09/01/15 0823 - 97.7 F (36.5 C) Oral (!) 121 - 95 %  09/01/15 0813 - - - (!) 109 - 99 %  09/01/15 0800 - - - (!) 109 - 99 %  09/01/15 0743 - - - (!) 106 - 100 %  Last temp: 6/12 @ 0003 38.2  Physical exam: General: Well nourished, well developed female in no acute distress. Abdomen: gravid, obese, nttp Back: no CVAT b/l. Pt notes some discomfort with palpation on the upper left side Cardiovascular: HR 90s-100s. no  MRGs Respiratory: CTAB, no respiratory distress. No Mission on Extremities: no clubbing, cyanosis or edema Skin: Warm and dry.   Medications: Current Facility-Administered Medications  Medication Dose Route Frequency Provider Last Rate Last Dose  . 0.9 %  sodium chloride infusion   Intravenous Continuous Praveen Mannam, MD 30 mL/hr at 09/01/15 1343 30 mL/hr at 09/01/15 1343  . acetaminophen (TYLENOL) tablet 650 mg  650 mg Oral Q4H PRN Wilmer FloorLisa A Leftwich-Kirby, CNM   650 mg at 09/01/15 1133  . albuterol (PROVENTIL) (2.5 MG/3ML) 0.083% nebulizer solution 3 mL  3 mL Inhalation Q4H PRN Peggy Constant, MD   3 mL at 08/31/15 1434  . butalbital-acetaminophen-caffeine (FIORICET, ESGIC) 50-325-40 MG per tablet 1 tablet  1 tablet Oral Q4H PRN Catalina AntiguaPeggy Constant, MD   1 tablet at 08/31/15 2154  . calcium carbonate (TUMS - dosed in mg elemental calcium) chewable tablet 400 mg of elemental calcium  2 tablet Oral Q4H PRN Hurshel PartyLisa A Leftwich-Kirby, CNM      . cyclobenzaprine (FLEXERIL) tablet 10 mg  10 mg Oral TID PRN Adam PhenixJames G Arnold, MD   10 mg at 08/31/15 2154  . diphenhydrAMINE (BENADRYL) capsule 25 mg  25 mg Oral QHS PRN Adam PhenixJames G Arnold, MD      . HYDROmorphone (DILAUDID) injection 1 mg  1 mg Intravenous Q2H PRN [place] Bing, MD   1 mg at 09/02/15 0515  . HYDROmorphone (DILAUDID) tablet 1 mg  1 mg Oral Q3H PRN Rhona Raider Stinson, DO   1 mg at 09/01/15 1120  . piperacillin-tazobactam (ZOSYN) IVPB 3.375 g  3.375 g Intravenous Q8H [place] Bing, MD   3.375 g at 09/02/15 0230  . prenatal multivitamin tablet 1 tablet  1 tablet Oral Q1200 Wilmer Floor Leftwich-Kirby, CNM   1 tablet at 09/01/15 1120  . promethazine (PHENERGAN) injection 25 mg  25 mg Intravenous Q6H PRN Hurshel Party, CNM   25 mg at 08/31/15 0334  . vancomycin (VANCOCIN) IVPB 750 mg/150 ml premix  750 mg Intravenous Q12H Rhona Raider Stinson, DO   750 mg at 09/01/15 2233    Labs:   Recent Labs Lab 08/31/15 0751 09/01/15 0552 09/02/15 0503  WBC 7.8 7.6 7.1   HGB 8.5* 9.0* 7.7*  HCT 25.5* 27.2* 23.1*  PLT 161 160 177  NEUTOPHILPCT  --   --  78  MONOPCT  --   --  7   CMP Latest Ref Rng 09/02/2015 09/01/2015 08/31/2015  Glucose 65 - 99 mg/dL 99 161(W) 960(A)  BUN 6 - 20 mg/dL 11 11 12   Creatinine 0.44 - 1.00 mg/dL 5.40(J) 8.11(B) 1.47(W)  Sodium 135 - 145 mmol/L 133(L) 129(L) 133(L)  Potassium 3.5 - 5.1 mmol/L 3.5 3.8 3.9  Chloride 101 - 111 mmol/L 106 103 107  CO2 22 - 32 mmol/L 21(L) 19(L) 18(L)  Calcium 8.9 - 10.3 mg/dL 8.3(L) 8.2(L) 8.5(L)  Total Protein 6.5 - 8.1 g/dL - - -  Total Bilirubin 0.3 - 1.2 mg/dL - - -  Alkaline Phos 38 - 126 U/L - - -  AST 15 - 41 U/L - - -  ALT 14 - 54 U/L - - -   UCx: >100k proteus susceptible to rocephin Pending: BCx x 2, Legionella Negative: procalcitonin x 2, troponin I x 3, BNP  Radiology: echo read pending  Assessment & Plan:  Pt simproved *IUP: no s/s of fetal distress. Follow up qday NSTs, prenatal vitamin *Pulm: pt continues to diuresis and s/s are improving. Continue with vanc/zosyn D#2 (s/p 4d of rocephin) -follow up pending labs -Pulm following; appreciate recommendations *Renal: see above. Cr improved. Will continue to follow *CV: improved VS.  -follow up echo read *Anemia: stable and likely some dilutional effect as she may be starting to mobilize a lot of 3rd spaced fluid; start PO iron *FEN/GI: regular diet. Saline lock IV. Marland Kitchen Pt with good UOP *PPx:OOB with assistance, SCDs *Dispo: pending resolution of pulmonary and renal s/s.   Cornelia Copa MD Attending [facility])

## 2015-09-02 NOTE — Progress Notes (Signed)
Post walking

## 2015-09-03 ENCOUNTER — Encounter (HOSPITAL_COMMUNITY): Payer: Self-pay

## 2015-09-03 DIAGNOSIS — E871 Hypo-osmolality and hyponatremia: Secondary | ICD-10-CM | POA: Diagnosis present

## 2015-09-03 LAB — PROCALCITONIN: Procalcitonin: 0.35 ng/mL

## 2015-09-03 MED ORDER — DEXTROSE 5 % IV SOLN
2.0000 g | INTRAVENOUS | Status: DC
  Administered 2015-09-03 – 2015-09-04 (×2): 2 g via INTRAVENOUS
  Filled 2015-09-03 (×2): qty 2

## 2015-09-03 NOTE — Consult Note (Signed)
PULMONARY / CRITICAL CARE MEDICINE   Name: [name] MRN: [id number] DOB: 08/12/1985    ADMISSION DATE:  08/29/2015 CONSULTATION DATE: 09/01/15  REFERRING MD:  [name], C MD  CHIEF COMPLAINT:  Dyspnea  HISTORY OF PRESENT ILLNESS:   Mrs. [name] is a 30 year old with past medical history of asthma, [redacted] weeks pregnant, G3,P2. She was admitted on 6/9 with left flank pain, fevers, found to have proteus urosepsis and treated with cetriaxone. She continues to be tachycardic with dyspnea. CTA done today does not show any PE, small bilateral effusions with associated atelectasis/consolidation. PCCM called for consultation.  She was seen in 2013 by Dr. Sherene Howard for asthma. PFTs at that time showed minimal obstruction, positive broncho-dilator response. [facility] However she is not currently on any long-term inhaler medication. She had a negative sleep study in 2015.  PAST MEDICAL HISTORY :  She  has a past medical history of Asthma; Seasonal allergies; Headache(784.0); and COPD (chronic obstructive pulmonary disease) (HCC).  PAST SURGICAL HISTORY: She  has past surgical history that includes Tonsillectomy; Wisdom tooth extraction; and Cesarean section.  Allergies  Allergen Reactions  . [name] [Salvia Officinalis] Swelling    No current facility-administered medications on file prior to encounter.   Current Outpatient Prescriptions on File Prior to Encounter  Medication Sig  . cetirizine (ZYRTEC) 10 MG tablet Take 10 mg by mouth daily as needed for allergies.   . cyclobenzaprine (FLEXERIL) 10 MG tablet Take 1 tablet (10 mg total) by mouth every 8 (eight) hours as needed for muscle spasms.  . fluticasone (FLONASE) 50 MCG/ACT nasal spray Place 2 sprays into both nostrils daily as needed for allergies.   . Prenatal Vit-Fe Fumarate-FA (PRENATAL VITAMIN) 27-0.8 MG TABS Take 1 tablet by mouth daily.    FAMILY HISTORY:  Her is adopted.  SOCIAL HISTORY: She   reports that she quit smoking about 4 months ago. Her smoking use included Cigarettes. She has a 1.5 pack-year smoking history. She has never used smokeless tobacco. She reports that she does not drink alcohol or use illicit drugs.  REVIEW OF SYSTEMS:   Complains of headache, back pain, flank pain. Positive for dyspnea. Denies any cough, sputum production, wheezing. Denies any chest pain, palpitation. Denies any nausea, vomiting, diarrhea, constipation. All other review of systems are negative  SUBJECTIVE:  No issues overnight. Still c/o headache and back pain  VITAL SIGNS: BP 108/64 mmHg  Pulse 87  Temp(Src) 97.6 F (36.4 C) (Oral)  Resp 36  Ht 5' 3.5" (1.613 m)  Wt 255 lb (115.667 kg)  BMI 44.46 kg/m2  SpO2 92%  LMP 02/03/2015  HEMODYNAMICS:    VENTILATOR SETTINGS:    INTAKE / OUTPUT: I/O last 3 completed shifts: In: 1860 [P.O.:840; I.V.:570; IV Piggyback:450] Out: 4300 [Urine:4300]  PHYSICAL EXAMINATION: General:  No distress Neuro:  Moves all 4 extremities, no gross focal deficits HEENT:  Moist mucous membranes, no thyromegaly, JVD Cardiovascular:  Tachycardia, regular. No MRG Lungs:  No wheeze, crackles Abdomen:  Soft, +BS Musculoskeletal:  Normal tone and bulk Skin:  Intact  LABS:  BMET  Recent Labs Lab 08/31/15 0751 09/01/15 0552 09/02/15 0503  NA 133* 129* 133*  K 3.9 3.8 3.5  CL 107 103 106  CO2 18* 19* 21*  BUN 12 11 11   CREATININE 1.09* 1.29* 1.15*  GLUCOSE 101* 126* 99    Electrolytes  Recent Labs Lab 08/31/15 0751 09/01/15 0552 09/02/15 0503  CALCIUM 8.5* 8.2* 8.3*  CBC  Recent Labs Lab 08/31/15 0751 09/01/15 0552 09/02/15 0503  WBC 7.8 7.6 7.1  HGB 8.5* 9.0* 7.7*  HCT 25.5* 27.2* 23.1*  PLT 161 160 177    Coag's No results for input(s): APTT, INR in the last 168 hours.  Sepsis Markers  Recent Labs Lab 08/30/15 0922  09/01/15 0956 09/01/15 1250 09/02/15 0503 09/03/15 0711  LATICACIDVEN 1.8  --  0.6  --    --   --   PROCALCITON  --   < > 0.87 0.82 0.48 0.35  < > = values in this interval not displayed.  ABG  Recent Labs Lab 09/01/15 1006  PHART 7.433  PCO2ART 26.2*  PO2ART 93.9    Liver Enzymes  Recent Labs Lab 08/29/15 1340 08/30/15 0705  AST 17 19  ALT 20 20  ALKPHOS 59 57  BILITOT 0.6 1.1  ALBUMIN 3.0* 2.6*    Cardiac Enzymes  Recent Labs Lab 09/01/15 1250 09/01/15 1906 09/02/15 0054  TROPONINI 0.03 <0.03 <0.03    Glucose No results for input(s): GLUCAP in the last 168 hours.  Imaging No results found.   STUDIES:  CTA 09/01/15 > no PE, no infiltrate. Small bilateral effusion with associated atelectasis/consolidation. Images reviewed.  PFTs 5/50/13 FVC 2.61 (68%) FEV1 1.94 (62%) F/F 74 TLC 82% DLCO 73% Minimal obstruction with small airway disease, positive bronchodilator response. Mild restrictive defect  Sleep study 2015-negative for sleep apnea.  ABG 7.43/26/94/17/99%  CULTURES: Ucx 6/9 > proteus  ANTIBIOTICS: Ceftriaxone 6/9 > 6/12 Vanco 6/12 > Zosyn 6/12 >  SIGNIFICANT EVENTS:  LINES/TUBES:  DISCUSSION: 30 year old with history of asthma, [redacted] weeks pregnant. Admitted with urosepsis. PCCM called for ongoing dyspnea, tachypnea. ABG shows hyperventilation  CT scan reviewed. It is not really impressive for pneumonia however she does have small infiltrates at the bases. Pt is diuresing well spontaneously. Pct is improving. Echo reviewed > looks normal.  RECCS: - Continue abx. Can narrow back to ceftriaxone as Pct continues to improve.  - Pain control with dilaudid - Albuterol Nebs PRN  PCCM will sign off. Please call with any questions. Discussed with Dr. Hinda Lenis MD [facility] Pager [phone] If no answer or after 3pm call: [phone] 09/03/2015, 9:15 AM

## 2015-09-03 NOTE — Progress Notes (Addendum)
FACULTY PRACTICE ANTEPARTUM(COMPREHENSIVE) NOTE  [name] is a 30 y.o. G3P2002 at 271w2d by LMP who is admitted for pyelonephritis.   Fetal presentation is unsure. Length of Stay:  5  Days  Subjective: Breathing better as is pain Patient reports the fetal movement as active. Patient reports uterine contraction  activity as none. Patient reports  vaginal bleeding as none. Patient describes fluid per vagina as None.  Vitals:  Blood pressure 105/60, pulse 87, temperature 96.5 F (35.8 C), temperature source Axillary, resp. rate 18, height 5' 3.5" (1.613 m), weight 115.667 kg (255 lb), last menstrual period 02/03/2015, SpO2 92 %.  Filed Vitals:   09/03/15 0450 09/03/15 0500 09/03/15 0550 09/03/15 0600  BP:      Pulse: 87 89 92 87  Temp:      TempSrc:      Resp: 18     Height:      Weight:      SpO2: 96% 96% 94% 92%    Physical Examination:  General appearance - alert, well appearing, and in no distress Heart - normal rate and regular rhythm Abdomen - soft, nontender, nondistended Fundal Height:  size equals dates mild CVAT Cervical Exam: Not evaluated.  Extremities: extremities normal, atraumatic, no cyanosis or edema and Homans sign is negative, no sign of DVT  Membranes:intact  Intake/Output Summary (Last 24 hours) at 09/03/15 0754 Last data filed at 09/03/15 0235  Gross per 24 hour  Intake    680 ml  Output   2950 ml  Net  -2270 ml      Labs:  No results found for this or any previous visit (from the past 24 hour(s)).  Imaging Studies:     Currently EPIC will not allow sonographic studies to automatically populate into notes.  In the meantime, copy and paste results into note or free text.  Medications:  Scheduled . docusate sodium  100 mg Oral Daily  . ferrous gluconate  324 mg Oral BID WC  . piperacillin-tazobactam (ZOSYN)  IV  3.375 g Intravenous Q8H  . prenatal multivitamin  1 tablet Oral Q1200  . vancomycin  750 mg Intravenous Q12H   I have  reviewed the patient's current medications.  ASSESSMENT: Active Problems:   Pyelonephritis affecting pregnancy in third trimester, antepartum   Hyponatremia  Suspected urosepsis improving  PLAN: Consider Rocephin if continues to improve  ARNOLD,JAMES 09/03/2015,7:52 AM

## 2015-09-04 ENCOUNTER — Other Ambulatory Visit: Payer: Self-pay | Admitting: Advanced Practice Midwife

## 2015-09-04 MED ORDER — ALBUTEROL SULFATE (2.5 MG/3ML) 0.083% IN NEBU: 3.0000 mL | INHALATION_SOLUTION | RESPIRATORY_TRACT | Status: DC | PRN

## 2015-09-04 MED ORDER — BUTALBITAL-APAP-CAFFEINE 50-325-40 MG PO TABS: 1.0000 | ORAL_TABLET | ORAL | Status: DC | PRN

## 2015-09-04 MED ORDER — CEPHALEXIN 500 MG PO CAPS: 500.0000 mg | ORAL_CAPSULE | Freq: Four times a day (QID) | ORAL | Status: DC

## 2015-09-04 NOTE — Discharge Summary (Signed)
OB Discharge Summary     Patient Name: [name] DOB: 12/17/1985 MRN: [id number]  Date of admission: 08/29/2015 Delivering MD: This patient has no babies on file.  Date of discharge: 09/04/2015  Admitting diagnosis: 29WKS, SIDE,BACK AND LOWER ABD PAIN pyelonephritis Intrauterine pregnancy: 648w3d     Secondary diagnosis:  Active Problems:   Pyelonephritis affecting pregnancy in third trimester, antepartum   Hyponatremia  Additional problems: possible urosepsis, work up negative     Discharge diagnosis: 148w3d pyelonephritis                                                                                                Post partum procedures:  Augmentation:   Complications:   Hospital course:  Pt admitted and begun on rocephin culture positive for proteus pan sensitive Had an episode concerning for urosepsis work up negative, vanc/zosyn added to regimen and subsequently stopped. Blood cultures negative and all related labs negative. Symptoms resolved  Discharged on oral keflex 500 QID: sensitive  Physical exam  Filed Vitals:   09/03/15 1005 09/03/15 1209 09/03/15 1639 09/03/15 2100  BP:  102/64 119/73 124/75  Pulse:  96 90 92  Temp:  98.1 F (36.7 C) 97.7 F (36.5 C) 97.7 F (36.5 C)  TempSrc:  Oral Oral Oral  Resp:  28 24 20   Height:      Weight: 269 lb 3.2 oz (122.108 kg)     SpO2:       General: alert, cooperative and no distress Lochia:  Uterine Fundus:  Incision:  DVT Evaluation:  Labs: Lab Results  Component Value Date   WBC 7.1 09/02/2015   HGB 7.7* 09/02/2015   HCT 23.1* 09/02/2015   MCV 84.0 09/02/2015   PLT 177 09/02/2015   CMP Latest Ref Rng 09/02/2015  Glucose 65 - 99 mg/dL 99  BUN 6 - 20 mg/dL 11  Creatinine 5.360.44 - 6.441.00 mg/dL 0.34(V1.15(H)  Sodium 425135 - 956145 mmol/L 133(L)  Potassium 3.5 - 5.1 mmol/L 3.5  Chloride 101 - 111 mmol/L 106  CO2 22 - 32 mmol/L 21(L)  Calcium 8.9 - 10.3 mg/dL 8.3(L)    Discharge instruction: per After Visit  Summary and "Baby and Me Booklet".  After visit meds:    Medication List    TAKE these medications        albuterol (2.5 MG/3ML) 0.083% nebulizer solution  Commonly known as:  PROVENTIL  Inhale 3 mLs into the lungs every 4 (four) hours as needed for wheezing or shortness of breath.     butalbital-acetaminophen-caffeine 50-325-40 MG tablet  Commonly known as:  FIORICET, ESGIC  Take 1 tablet by mouth every 4 (four) hours as needed for headache.     cephALEXin 500 MG capsule  Commonly known as:  KEFLEX  Take 1 capsule (500 mg total) by mouth 4 (four) times daily.     cetirizine 10 MG tablet  Commonly known as:  ZYRTEC  Take 10 mg by mouth daily as needed for allergies.     cyclobenzaprine 10 MG tablet  Commonly known as:  FLEXERIL  Take 1 tablet (10  mg total) by mouth every 8 (eight) hours as needed for muscle spasms.     diphenhydrAMINE 25 MG tablet  Commonly known as:  BENADRYL  Take 25 mg by mouth at bedtime as needed for allergies.     fluticasone 50 MCG/ACT nasal spray  Commonly known as:  FLONASE  Place 2 sprays into both nostrils daily as needed for allergies.     Prenatal Vitamin 27-0.8 MG Tabs  Take 1 tablet by mouth daily.        Diet: routine diet  Activity: Advance as tolerated. Pelvic rest for 6 weeks.   Outpatient follow ZO:XWRUEAV Follow up Appt:Future Appointments Date Time Provider Department Center  09/09/2015 10:20 AM [name], CNM [facility]  10/01/2015 10:15 AM [facility] [place] [facility]   Follow up Visit:    09/04/2015 Lazaro Arms, MD

## 2015-09-04 NOTE — Progress Notes (Signed)
Pharmacy ([facility] on [address]) called. Patient was given nebulizer solution, but states that her nebulizer machine is broken. Telephone order given for Albuterol MDI 1-2 puffs q 4 hours as needed, 1 device with 1 refill.

## 2015-09-06 LAB — CULTURE, BLOOD (ROUTINE X 2)
CULTURE: NO GROWTH
Culture: NO GROWTH

## 2015-09-09 ENCOUNTER — Encounter: Payer: Self-pay | Admitting: Advanced Practice Midwife

## 2015-09-09 ENCOUNTER — Ambulatory Visit (INDEPENDENT_AMBULATORY_CARE_PROVIDER_SITE_OTHER): Payer: Medicaid Other | Admitting: Advanced Practice Midwife

## 2015-09-09 VITALS — BP 115/68 | HR 98 | Temp 98.1°F | Wt 258.5 lb

## 2015-09-09 DIAGNOSIS — Z3483 Encounter for supervision of other normal pregnancy, third trimester: Secondary | ICD-10-CM

## 2015-09-09 DIAGNOSIS — Z87440 Personal history of urinary (tract) infections: Secondary | ICD-10-CM | POA: Diagnosis not present

## 2015-09-09 DIAGNOSIS — Z8759 Personal history of other complications of pregnancy, childbirth and the puerperium: Secondary | ICD-10-CM

## 2015-09-09 DIAGNOSIS — Z3493 Encounter for supervision of normal pregnancy, unspecified, third trimester: Secondary | ICD-10-CM

## 2015-09-09 LAB — POCT URINALYSIS DIP (DEVICE)
BILIRUBIN URINE: NEGATIVE
GLUCOSE, UA: NEGATIVE mg/dL
Hgb urine dipstick: NEGATIVE
KETONES UR: NEGATIVE mg/dL
Nitrite: NEGATIVE
Protein, ur: NEGATIVE mg/dL
SPECIFIC GRAVITY, URINE: 1.015 (ref 1.005–1.030)
Urobilinogen, UA: 0.2 mg/dL (ref 0.0–1.0)
pH: 6.5 (ref 5.0–8.0)

## 2015-09-09 NOTE — Progress Notes (Signed)
Subjective:  [name] is a 30 y.o. G3P2002 at 2164w1d being seen today for ongoing prenatal care.  She is currently monitored for the following issues for this low-risk pregnancy and has Dyspnea; Edema; Asthma; Supervision of low-risk pregnancy; Previous cesarean section; Morbid obesity with BMI of 40.0-44.9, adult (HCC); Asymptomatic bacteriuria, antepartum; Pyelonephritis affecting pregnancy in third trimester, antepartum; and Hyponatremia on her problem list.  Patient reports no complaints.   .  .  Movement: Present. Denies leaking of fluid.   The following portions of the patient's history were reviewed and updated as appropriate: allergies, current medications, past family history, past medical history, past social history, past surgical history and problem list. Problem list updated.  Objective:   Filed Vitals:   09/09/15 1048  BP: 115/68  Pulse: 98  Temp: 98.1 F (36.7 C)  Weight: 258 lb 8 oz (117.255 kg)    Fetal Status: Fetal Heart Rate (bpm): 156 Fundal Height: 33 cm Movement: Present     General:  Alert, oriented and cooperative. Patient is in no acute distress.  Skin: Skin is warm and dry. No rash noted.   Cardiovascular: Normal heart rate noted  Respiratory: Normal respiratory effort, no problems with respiration noted  Abdomen: Soft, gravid, appropriate for gestational age. Pain/Pressure: Absent     Pelvic: Cervical exam deferred        Extremities: Normal range of motion.  Edema: Trace  Mental Status: Normal mood and affect. Normal behavior. Normal judgment and thought content.   Urinalysis: Urine Protein: Negative Urine Glucose: Negative  Assessment and Plan:  Pregnancy: G3P2002 at 3064w1d  1. Supervision of normal pregnancy in third trimester   2. Hx of pyelonephritis during pregnancy --Admission to hospital for pyelo and kidney stones 6/9.   --Feels much better, returning to work tomorrow, continuing PO abx.  Preterm labor symptoms and general obstetric  precautions including but not limited to vaginal bleeding, contractions, leaking of fluid and fetal movement were reviewed in detail with the patient. Please refer to After Visit Summary for other counseling recommendations.  Return in about 3 weeks (around 09/30/2015).   Hurshel PartyLisa A Leftwich-Kirby, CNM

## 2015-10-01 ENCOUNTER — Ambulatory Visit (INDEPENDENT_AMBULATORY_CARE_PROVIDER_SITE_OTHER): Payer: Medicaid Other | Admitting: Obstetrics & Gynecology

## 2015-10-01 ENCOUNTER — Ambulatory Visit (HOSPITAL_COMMUNITY)
Admission: RE | Admit: 2015-10-01 | Discharge: 2015-10-01 | Disposition: A | Discharge status: Home / Self Care | Payer: Medicaid Other | Source: Ambulatory Visit | Attending: Medical | Admitting: Medical

## 2015-10-01 VITALS — BP 120/70 | HR 86 | Wt 255.0 lb

## 2015-10-01 DIAGNOSIS — O34219 Maternal care for unspecified type scar from previous cesarean delivery: Secondary | ICD-10-CM

## 2015-10-01 DIAGNOSIS — Z36 Encounter for antenatal screening of mother: Secondary | ICD-10-CM | POA: Diagnosis present

## 2015-10-01 DIAGNOSIS — Z6841 Body Mass Index (BMI) 40.0 and over, adult: Secondary | ICD-10-CM

## 2015-10-01 DIAGNOSIS — Z3493 Encounter for supervision of normal pregnancy, unspecified, third trimester: Secondary | ICD-10-CM

## 2015-10-01 DIAGNOSIS — Z3A34 34 weeks gestation of pregnancy: Secondary | ICD-10-CM | POA: Diagnosis not present

## 2015-10-01 DIAGNOSIS — Z3689 Encounter for other specified antenatal screening: Secondary | ICD-10-CM

## 2015-10-01 DIAGNOSIS — O99210 Obesity complicating pregnancy, unspecified trimester: Secondary | ICD-10-CM | POA: Diagnosis present

## 2015-10-01 DIAGNOSIS — Z98891 History of uterine scar from previous surgery: Secondary | ICD-10-CM

## 2015-10-01 DIAGNOSIS — O2303 Infections of kidney in pregnancy, third trimester: Secondary | ICD-10-CM

## 2015-10-01 DIAGNOSIS — O99213 Obesity complicating pregnancy, third trimester: Secondary | ICD-10-CM

## 2015-10-01 LAB — POCT URINALYSIS DIP (DEVICE)
Bilirubin Urine: NEGATIVE
Glucose, UA: NEGATIVE mg/dL
Ketones, ur: NEGATIVE mg/dL
Nitrite: NEGATIVE
Protein, ur: 30 mg/dL — AB
Specific Gravity, Urine: 1.02 (ref 1.005–1.030)
Urobilinogen, UA: 1 mg/dL (ref 0.0–1.0)
pH: 6.5 (ref 5.0–8.0)

## 2015-10-01 MED ORDER — CYCLOBENZAPRINE HCL 10 MG PO TABS: 10.0000 mg | ORAL_TABLET | Freq: Three times a day (TID) | ORAL | Status: DC | PRN

## 2015-10-01 NOTE — Addendum Note (Signed)
Addended by: Sherre LainASH, AMANDA A on: 10/01/2015 02:08 PM   Modules accepted: Orders

## 2015-10-01 NOTE — Progress Notes (Signed)
Needs refill on flexeril.

## 2015-10-01 NOTE — Progress Notes (Signed)
Subjective:  [name] is a 30 y.o. MW G3P2002 at 5471w2d being seen today for ongoing prenatal care.  She is currently monitored for the following issues for this high-risk pregnancy and has Dyspnea; Edema; Asthma; Supervision of low-risk pregnancy; Previous cesarean section; Morbid obesity with BMI of 40.0-44.9, adult (HCC); Asymptomatic bacteriuria, antepartum; Pyelonephritis affecting pregnancy in third trimester, antepartum; and Hyponatremia on her problem list.  Patient reports no complaints.  Contractions: Not present. Vag. Bleeding: None.  Movement: Present. Denies leaking of fluid.   The following portions of the patient's history were reviewed and updated as appropriate: allergies, current medications, past family history, past medical history, past social history, past surgical history and problem list. Problem list updated.  Objective:   Filed Vitals:   10/01/15 1258  BP: 120/70  Pulse: 86  Weight: 255 lb (115.667 kg)    Fetal Status: Fetal Heart Rate (bpm): 150 Fundal Height: 39 cm Movement: Present     General:  Alert, oriented and cooperative. Patient is in no acute distress.  Skin: Skin is warm and dry. No rash noted.   Cardiovascular: Normal heart rate noted  Respiratory: Normal respiratory effort, no problems with respiration noted  Abdomen: Soft, gravid, appropriate for gestational age. Pain/Pressure: Absent     Pelvic:  Cervical exam deferred        Extremities: Normal range of motion.  Edema: Trace  Mental Status: Normal mood and affect. Normal behavior. Normal judgment and thought content.   Urinalysis:      Assessment and Plan:  Pregnancy: G3P2002 at 8171w2d  1. Previous cesarean section - Prefers TOLAC - u/s today showed 77% growth. However FH definitely greater than dates.  2. Morbid obesity with BMI of 40.0-44.9, adult (HCC) - Appropriate weight gain  3. Supervision of low-risk pregnancy, third trimester   Preterm labor symptoms and general  obstetric precautions including but not limited to vaginal bleeding, contractions, leaking of fluid and fetal movement were reviewed in detail with the patient. Please refer to After Visit Summary for other counseling recommendations.  No Follow-up on file.   Allie BossierMyra C Jaine Estabrooks, MD

## 2015-10-06 ENCOUNTER — Encounter (HOSPITAL_COMMUNITY): Payer: Self-pay | Admitting: *Deleted

## 2015-10-06 ENCOUNTER — Inpatient Hospital Stay (HOSPITAL_COMMUNITY): Payer: Medicaid Other

## 2015-10-06 ENCOUNTER — Inpatient Hospital Stay (HOSPITAL_COMMUNITY)
Admission: AD | Admit: 2015-10-06 | Discharge: 2015-10-06 | Disposition: A | Discharge status: Home / Self Care | Payer: Medicaid Other | Source: Ambulatory Visit | Attending: Obstetrics & Gynecology | Admitting: Obstetrics & Gynecology

## 2015-10-06 DIAGNOSIS — Z87891 Personal history of nicotine dependence: Secondary | ICD-10-CM | POA: Insufficient documentation

## 2015-10-06 DIAGNOSIS — O99513 Diseases of the respiratory system complicating pregnancy, third trimester: Secondary | ICD-10-CM | POA: Insufficient documentation

## 2015-10-06 DIAGNOSIS — N201 Calculus of ureter: Secondary | ICD-10-CM | POA: Insufficient documentation

## 2015-10-06 DIAGNOSIS — Z3A35 35 weeks gestation of pregnancy: Secondary | ICD-10-CM | POA: Insufficient documentation

## 2015-10-06 DIAGNOSIS — R8271 Bacteriuria: Secondary | ICD-10-CM

## 2015-10-06 DIAGNOSIS — N134 Hydroureter: Secondary | ICD-10-CM | POA: Diagnosis not present

## 2015-10-06 DIAGNOSIS — O9989 Other specified diseases and conditions complicating pregnancy, childbirth and the puerperium: Secondary | ICD-10-CM

## 2015-10-06 DIAGNOSIS — R1030 Lower abdominal pain, unspecified: Secondary | ICD-10-CM

## 2015-10-06 DIAGNOSIS — O26893 Other specified pregnancy related conditions, third trimester: Secondary | ICD-10-CM | POA: Diagnosis not present

## 2015-10-06 DIAGNOSIS — J45909 Unspecified asthma, uncomplicated: Secondary | ICD-10-CM | POA: Diagnosis not present

## 2015-10-06 DIAGNOSIS — R109 Unspecified abdominal pain: Secondary | ICD-10-CM | POA: Diagnosis present

## 2015-10-06 DIAGNOSIS — N200 Calculus of kidney: Secondary | ICD-10-CM

## 2015-10-06 DIAGNOSIS — O26833 Pregnancy related renal disease, third trimester: Secondary | ICD-10-CM | POA: Diagnosis not present

## 2015-10-06 HISTORY — DX: Infections of kidney in pregnancy, third trimester: O23.03

## 2015-10-06 LAB — URINALYSIS, ROUTINE W REFLEX MICROSCOPIC
Bilirubin Urine: NEGATIVE
GLUCOSE, UA: NEGATIVE mg/dL
Ketones, ur: NEGATIVE mg/dL
Nitrite: NEGATIVE
PROTEIN: NEGATIVE mg/dL
Specific Gravity, Urine: 1.025 (ref 1.005–1.030)
pH: 6.5 (ref 5.0–8.0)

## 2015-10-06 LAB — CBC WITH DIFFERENTIAL/PLATELET
BASOS ABS: 0 10*3/uL (ref 0.0–0.1)
BASOS PCT: 0 %
EOS ABS: 0 10*3/uL (ref 0.0–0.7)
EOS PCT: 1 %
HCT: 30.7 % — ABNORMAL LOW (ref 36.0–46.0)
Hemoglobin: 10.3 g/dL — ABNORMAL LOW (ref 12.0–15.0)
LYMPHS PCT: 14 %
Lymphs Abs: 1 10*3/uL (ref 0.7–4.0)
MCH: 28.3 pg (ref 26.0–34.0)
MCHC: 33.6 g/dL (ref 30.0–36.0)
MCV: 84.3 fL (ref 78.0–100.0)
MONO ABS: 0.3 10*3/uL (ref 0.1–1.0)
Monocytes Relative: 5 %
Neutro Abs: 5.9 10*3/uL (ref 1.7–7.7)
Neutrophils Relative %: 80 %
PLATELETS: 232 10*3/uL (ref 150–400)
RBC: 3.64 MIL/uL — AB (ref 3.87–5.11)
RDW: 14.7 % (ref 11.5–15.5)
WBC: 7.3 10*3/uL (ref 4.0–10.5)

## 2015-10-06 LAB — PROTEIN / CREATININE RATIO, URINE
Creatinine, Urine: 125 mg/dL
PROTEIN CREATININE RATIO: 0.28 mg/mg{creat} — AB (ref 0.00–0.15)
TOTAL PROTEIN, URINE: 35 mg/dL

## 2015-10-06 LAB — COMPREHENSIVE METABOLIC PANEL
ALBUMIN: 2.9 g/dL — AB (ref 3.5–5.0)
ALK PHOS: 113 U/L (ref 38–126)
ALT: 39 U/L (ref 14–54)
ANION GAP: 8 (ref 5–15)
AST: 23 U/L (ref 15–41)
BUN: 10 mg/dL (ref 6–20)
CO2: 22 mmol/L (ref 22–32)
Calcium: 9.2 mg/dL (ref 8.9–10.3)
Chloride: 105 mmol/L (ref 101–111)
Creatinine, Ser: 0.77 mg/dL (ref 0.44–1.00)
GFR calc Af Amer: >60 mL/min (ref >60)
GFR calc non Af Amer: >60 mL/min (ref >60)
GLUCOSE: 124 mg/dL — AB (ref 65–99)
Potassium: 3.7 mmol/L (ref 3.5–5.1)
SODIUM: 135 mmol/L (ref 135–145)
TOTAL PROTEIN: 6.7 g/dL (ref 6.5–8.1)
Total Bilirubin: 0.8 mg/dL (ref 0.3–1.2)

## 2015-10-06 LAB — URINE MICROSCOPIC-ADD ON

## 2015-10-06 MED ORDER — OXYCODONE HCL 5 MG PO TABS: 5.0000 mg | ORAL_TABLET | Freq: Four times a day (QID) | ORAL | Status: DC | PRN

## 2015-10-06 MED ORDER — NALBUPHINE HCL 10 MG/ML IJ SOLN
10.0000 mg | INTRAMUSCULAR | Status: DC | PRN
  Administered 2015-10-06: 10 mg via INTRAMUSCULAR
  Filled 2015-10-06: qty 1

## 2015-10-06 MED ORDER — DEXTROSE 5 % IV SOLN
2.0000 g | Freq: Once | INTRAVENOUS | Status: AC
  Administered 2015-10-06: 2 g via INTRAVENOUS
  Filled 2015-10-06: qty 2

## 2015-10-06 MED ORDER — OXYCODONE-ACETAMINOPHEN 5-325 MG PO TABS
1.0000 | ORAL_TABLET | Freq: Once | ORAL | Status: AC
Start: 2015-10-06 — End: 2015-10-06
  Administered 2015-10-06: 1 via ORAL
  Filled 2015-10-06: qty 1

## 2015-10-06 MED ORDER — LACTATED RINGERS IV BOLUS (SEPSIS)
1000.0000 mL | Freq: Once | INTRAVENOUS | Status: AC
  Administered 2015-10-06: 1000 mL via INTRAVENOUS

## 2015-10-06 MED ORDER — DEXTROSE 5 % IV SOLN
1.0000 g | Freq: Once | INTRAVENOUS | Status: DC
  Filled 2015-10-06: qty 10

## 2015-10-06 MED ORDER — ONDANSETRON 8 MG PO TBDP
8.0000 mg | ORAL_TABLET | Freq: Once | ORAL | Status: AC
  Administered 2015-10-06: 8 mg via ORAL
  Filled 2015-10-06: qty 1

## 2015-10-06 MED ORDER — TAMSULOSIN HCL 0.4 MG PO CAPS
0.4000 mg | ORAL_CAPSULE | Freq: Every day | ORAL | Status: DC
  Administered 2015-10-06: 0.4 mg via ORAL
  Filled 2015-10-06: qty 1

## 2015-10-06 MED ORDER — SODIUM CHLORIDE 0.9 % IV SOLN
INTRAVENOUS | Status: DC
  Administered 2015-10-06: 09:00:00 via INTRAVENOUS

## 2015-10-06 MED ORDER — TAMSULOSIN HCL 0.4 MG PO CAPS: 0.4000 mg | ORAL_CAPSULE | Freq: Every day | ORAL | Status: DC

## 2015-10-06 NOTE — MAU Provider Note (Signed)
Labor and Delivery HISTORY AND PHYSICAL  Chief Complaint:  Abdominal Pain  [name] is a 30 y.o.  G3P2002  at 9743w0d presenting for abdominal pain, that woke her up at 2am. She rates this a 10/10 and is having N/V. The pain is localized to bilateral lower quadrants without radiation. Patient states she has been having  none contractions, none vaginal bleeding, intact membranes, with active fetal movement.    Past Medical History  Diagnosis Date  . Asthma   . Seasonal allergies   . WNUUVOZD(664.4Headache(784.0)     Past Surgical History  Procedure Laterality Date  . Tonsillectomy    . Wisdom tooth extraction    . Cesarean section      Family History  Problem Relation Age of Onset  . Adopted: Yes  . Family history unknown: Yes    Social History  Substance Use Topics  . Smoking status: Former Smoker -- 0.30 packs/Howard for 5 years    Types: Cigarettes    Quit date: 04/10/2015  . Smokeless tobacco: Never Used  . Alcohol Use: No    Allergies  Allergen Reactions  . Earnestine LeysSage [Salvia Officinalis] Swelling    Prescriptions prior to admission  Medication Sig Dispense Refill Last Dose  . albuterol (PROVENTIL) (2.5 MG/3ML) 0.083% nebulizer solution Inhale 3 mLs into the lungs every 4 (four) hours as needed for wheezing or shortness of breath. 75 mL 12 10/05/2015 at Unknown time  . cyclobenzaprine (FLEXERIL) 10 MG tablet Take 1 tablet (10 mg total) by mouth every 8 (eight) hours as needed for muscle spasms. 30 tablet 2 10/05/2015 at Unknown time  . diphenhydrAMINE (BENADRYL) 25 MG tablet Take 25 mg by mouth at bedtime as needed for allergies.   10/05/2015 at Unknown time  . ferrous sulfate 325 (65 FE) MG tablet Take 325 mg by mouth daily with breakfast.   10/05/2015 at Unknown time  . Prenatal Vit-Fe Fumarate-FA (PRENATAL VITAMIN) 27-0.8 MG TABS Take 1 tablet by mouth daily. 30 tablet 12 10/05/2015 at Unknown time  . butalbital-acetaminophen-caffeine (FIORICET, ESGIC) 50-325-40 MG tablet Take 1  tablet by mouth every 4 (four) hours as needed for headache. 14 tablet 0 Unknown at Unknown time  . cephALEXin (KEFLEX) 500 MG capsule Take 1 capsule (500 mg total) by mouth 4 (four) times daily. 28 capsule 7 Taking  . cetirizine (ZYRTEC) 10 MG tablet Take 10 mg by mouth daily as needed for allergies.    Taking  . fluticasone (FLONASE) 50 MCG/ACT nasal spray Place 2 sprays into both nostrils daily as needed for allergies.    Taking  . PROAIR HFA 108 (90 Base) MCG/ACT inhaler Inhale 1 puff into the lungs as needed.  1 Taking    Review of Systems - Negative except for what is mentioned in HPI.  Physical Exam  Blood pressure 128/76, pulse 94, temperature 97.5 F (36.4 C), temperature source Oral, resp. rate 22, height 5' 5.5" (1.664 m), weight 116.121 kg (256 lb), last menstrual period 02/03/2015, SpO2 99 %. GENERAL: Well-developed, well-nourished female in mild distress.  LUNGS: Clear to auscultation bilaterally.  HEART: Regular rate and rhythm. ABDOMEN: Soft, nontender, nondistended, gravid. +CVAT on left EXTREMITIES: Nontender, no edema, 2+ distal pulses. FHT:  Cat 1 Contractions: q 8-210min   Labs: Results for orders placed or performed during the hospital encounter of 10/06/15 (from the past 24 hour(s))  Urinalysis, Routine w reflex microscopic (not [facility])   Collection Time: 10/06/15  4:11 AM  Result Value Ref Range  Color, Urine YELLOW YELLOW   APPearance CLEAR CLEAR   Specific Gravity, Urine 1.025 1.005 - 1.030   pH 6.5 5.0 - 8.0   Glucose, UA NEGATIVE NEGATIVE mg/dL   Hgb urine dipstick LARGE (A) NEGATIVE   Bilirubin Urine NEGATIVE NEGATIVE   Ketones, ur NEGATIVE NEGATIVE mg/dL   Protein, ur NEGATIVE NEGATIVE mg/dL   Nitrite NEGATIVE NEGATIVE   Leukocytes, UA MODERATE (A) NEGATIVE  Urine microscopic-add on   Collection Time: 10/06/15  4:11 AM  Result Value Ref Range   Squamous Epithelial / LPF 0-5 (A) NONE SEEN   WBC, UA 6-30 0 - 5 WBC/hpf   RBC / HPF 6-30 0 - 5  RBC/hpf   Bacteria, UA FEW (A) NONE SEEN  Protein / creatinine ratio, urine   Collection Time: 10/06/15  4:11 AM  Result Value Ref Range   Creatinine, Urine 125.00 mg/dL   Total Protein, Urine 35 mg/dL   Protein Creatinine Ratio 0.28 (H) 0.00 - 0.15 mg/mg[Cre]  Comprehensive metabolic panel   Collection Time: 10/06/15  5:04 AM  Result Value Ref Range   Sodium 135 135 - 145 mmol/L   Potassium 3.7 3.5 - 5.1 mmol/L   Chloride 105 101 - 111 mmol/L   CO2 22 22 - 32 mmol/L   Glucose, Bld 124 (H) 65 - 99 mg/dL   BUN 10 6 - 20 mg/dL   Creatinine, Ser 0.98 0.44 - 1.00 mg/dL   Calcium 9.2 8.9 - 11.9 mg/dL   Total Protein 6.7 6.5 - 8.1 g/dL   Albumin 2.9 (L) 3.5 - 5.0 g/dL   AST 23 15 - 41 U/L   ALT 39 14 - 54 U/L   Alkaline Phosphatase 113 38 - 126 U/L   Total Bilirubin 0.8 0.3 - 1.2 mg/dL   GFR calc non Af Amer >60 >60 mL/min   GFR calc Af Amer >60 >60 mL/min   Anion gap 8 5 - 15  CBC with Differential/Platelet   Collection Time: 10/06/15  5:04 AM  Result Value Ref Range   WBC 7.3 4.0 - 10.5 K/uL   RBC 3.64 (L) 3.87 - 5.11 MIL/uL   Hemoglobin 10.3 (L) 12.0 - 15.0 g/dL   HCT 14.7 (L) 82.9 - 56.2 %   MCV 84.3 78.0 - 100.0 fL   MCH 28.3 26.0 - 34.0 pg   MCHC 33.6 30.0 - 36.0 g/dL   RDW 13.0 86.5 - 78.4 %   Platelets 232 150 - 400 K/uL   Neutrophils Relative % 80 %   Neutro Abs 5.9 1.7 - 7.7 K/uL   Lymphocytes Relative 14 %   Lymphs Abs 1.0 0.7 - 4.0 K/uL   Monocytes Relative 5 %   Monocytes Absolute 0.3 0.1 - 1.0 K/uL   Eosinophils Relative 1 %   Eosinophils Absolute 0.0 0.0 - 0.7 K/uL   Basophils Relative 0 %   Basophils Absolute 0.0 0.0 - 0.1 K/uL    Imaging Studies:  US Renal  10/06/2015  CLINICAL DATA:  Hematuria with lower abdominal pain and vomiting EXAM: RENAL / URINARY TRACT ULTRASOUND COMPLETE COMPARISON:  September 01, 2015 FINDINGS: Right Kidney: Length: 12.5 cm. Echogenicity and renal cortical thickness are within normal limits. No mass, perinephric fluid, or  hydronephrosis visualized. No sonographically demonstrable calculus or ureterectasis. Left Kidney: Length: 14.2 cm. Echogenicity and renal cortical thickness are within normal limits. No mass or perinephric fluid visualized. There is slight fullness of the left renal collecting system. There is a focal calculus in the  lower pole left kidney measuring 9 x 8 x 8 mm. There are scattered smaller echogenic foci throughout the left kidney. There is a calculus in the proximal left ureter measuring 9 x 5 x 7 mm with mild ureterectasis proximal to this proximal ureteral calculus. Bladder: Appears normal for degree of bladder distention. IMPRESSION: There is a calculus in the proximal left ureter measuring 9 x 5 x 7 mm with mild pelvicaliectasis and proximal ureterectasis on the left. There are intrarenal calculi on the left. Left kidney otherwise appears normal. Right kidney appears normal. These results will be called to the ordering clinician or representative by the Radiologist Assistant, and communication documented in the PACS or zVision Dashboard. Electronically Signed   By: Bretta Bang III M.D.   On: 10/06/2015 07:11   [place] [facility] Ob Follow Up  10/01/2015  OBSTETRICAL ULTRASOUND: This exam was performed within a [facility]. The OB US report was generated in the AS system, and faxed to the ordering physician.  This report is available in the [employer]. See the AS Obstetric US report via the Image Link.   Assessment: [name] is  30 y.o. G3P2002 at [redacted]w[redacted]d presents with abdominal pain.  Plan: Abdominal pain: Possible differential includes pyelonephritis, kidney stones, diverticulitis, preterm labor, cervicitis, ovarian torsion. -preterm labor less likely - cervix is 1/thick/posterior -CBC w diff not concerning for infection or acute blood loss -UA large blood, few bacteria -Pr/Cr ratio  -Urine culture ordered  -renal US consistent with calculus in the proximal left ureter  measuring 9 x 5 x 7mm with mild pelvicaliectasis and proximal ureterectasis on the left. There are intrarenal calculi on the left -admit for fluids and pain management   Loni Muse 7/17/20177:46 AM

## 2015-10-06 NOTE — Discharge Instructions (Signed)
Kidney Stones  Kidney stones (urolithiasis) are solid masses that form inside your kidneys. The intense pain is caused by the stone moving through the kidney, ureter, bladder, and urethra (urinary tract). When the stone moves, the ureter starts to spasm around the stone. The stone is usually passed in your pee (urine).   HOME CARE  · Drink enough fluids to keep your pee clear or pale yellow. This helps to get the stone out.  · Take a 24-hour pee (urine) sample as told by your doctor. You may need to take another sample every 6-12 months.  · Strain all pee through the provided strainer. Do not pee without peeing through the strainer, not even once. If you pee the stone out, catch it in the strainer. The stone may be as small as a grain of salt. Take this to your doctor. This will help your doctor figure out what you can do to try to prevent more kidney stones.  · Only take medicine as told by your doctor.  · Make changes to your daily diet as told by your doctor. You may be told to:    Limit how much salt you eat.    Eat 5 or more servings of fruits and vegetables each day.    Limit how much meat, poultry, fish, and eggs you eat.  · Keep all follow-up visits as told by your doctor. This is important.  · Get follow-up X-rays as told by your doctor.  GET HELP IF:  You have pain that gets worse even if you have been taking pain medicine.  GET HELP RIGHT AWAY IF:   · Your pain does not get better with medicine.  · You have a fever or shaking chills.  · Your pain increases and gets worse over 18 hours.  · You have new belly (abdominal) pain.  · You feel faint or pass out.  · You are unable to pee.     This information is not intended to replace advice given to you by your health care provider. Make sure you discuss any questions you have with your health care provider.     Document Released: 08/25/2007 Document Revised: 11/27/2014 Document Reviewed: 08/09/2012  Elsevier Interactive Patient Education ©2016 [employer]

## 2015-10-06 NOTE — MAU Note (Signed)
Pt reports lower abd pain and vomiting since 2am

## 2015-10-07 LAB — URINE CULTURE

## 2015-10-10 ENCOUNTER — Telehealth: Payer: Self-pay | Admitting: *Deleted

## 2015-10-10 NOTE — Telephone Encounter (Signed)
Pt left message stating that she is severely constipated and has not had BM in 4 days.  She wants to know what she can take and also stated that she was at the hospital on 7/17 due to kidney stone. I returned call to pt and left message advising her of the following: she may take Colace 100 mg twice daily, Miralax per package directions. She should also be sure to drink 6-8 glasses of water daily, increase intake of fruits and vegetables and she may try drinking some warm prune juice. Her next appt is 8/4 @ 0900.  She may call back if she has additional questions.

## 2015-10-24 ENCOUNTER — Ambulatory Visit (INDEPENDENT_AMBULATORY_CARE_PROVIDER_SITE_OTHER): Payer: Medicaid Other | Admitting: Obstetrics & Gynecology

## 2015-10-24 VITALS — BP 105/68 | HR 91 | Wt 253.5 lb

## 2015-10-24 DIAGNOSIS — Z6841 Body Mass Index (BMI) 40.0 and over, adult: Secondary | ICD-10-CM

## 2015-10-24 DIAGNOSIS — Z3493 Encounter for supervision of normal pregnancy, unspecified, third trimester: Secondary | ICD-10-CM

## 2015-10-24 DIAGNOSIS — Z98891 History of uterine scar from previous surgery: Secondary | ICD-10-CM

## 2015-10-24 NOTE — Progress Notes (Signed)
Subjective:  [name] is a 30 y.o. AA  [id number] at [redacted]w[redacted]d being seen today for ongoing prenatal care.  She is currently monitored for the following issues for this low-risk pregnancy and has Dyspnea; Edema; Asthma; Supervision of low-risk pregnancy; Previous cesarean section; Morbid obesity with BMI of 40.0-44.9, adult (HCC); Asymptomatic bacteriuria, antepartum; and Nephrolithiasis on her problem list.  Patient reports no complaints.  Contractions: Not present. Vag. Bleeding: None.  Movement: Present. Denies leaking of fluid.   The following portions of the patient's history were reviewed and updated as appropriate: allergies, current medications, past family history, past medical history, past social history, past surgical history and problem list. Problem list updated.  Objective:   Vitals:   10/24/15 0859  BP: 105/68  Pulse: 91  Weight: 253 lb 8 oz (115 kg)    Fetal Status:     Movement: Present     General:  Alert, oriented and cooperative. Patient is in no acute distress.  Skin: Skin is warm and dry. No rash noted.   Cardiovascular: Normal heart rate noted  Respiratory: Normal respiratory effort, no problems with respiration noted  Abdomen: Soft, gravid, appropriate for gestational age. Pain/Pressure: Present     Pelvic:  Cervical exam performed        Extremities: Normal range of motion.  Edema: None  Mental Status: Normal mood and affect. Normal behavior. Normal judgment and thought content.   Urinalysis:      Assessment and Plan:  Pregnancy: G3P2002 at [redacted]w[redacted]d  1. Supervision of low-risk pregnancy, third trimester  - Culture, beta strep (group b only) - GC/Chlamydia probe amp ([place])not at [facility]  2. Previous cesarean section - Plans for TOLAC  3. Morbid obesity with BMI of 40.0-44.9, adult (HCC)   Preterm labor symptoms and general obstetric precautions including but not limited to vaginal bleeding, contractions, leaking of fluid and fetal movement were  reviewed in detail with the patient. Please refer to After Visit Summary for other counseling recommendations.  No Follow-up on file.   Allie Bossier, MD

## 2015-10-26 LAB — CULTURE, BETA STREP (GROUP B ONLY)

## 2015-10-27 ENCOUNTER — Other Ambulatory Visit: Payer: Self-pay | Admitting: Obstetrics & Gynecology

## 2015-10-27 ENCOUNTER — Ambulatory Visit (HOSPITAL_COMMUNITY)
Admission: RE | Admit: 2015-10-27 | Discharge: 2015-10-27 | Disposition: A | Discharge status: Home / Self Care | Payer: Medicaid Other | Source: Ambulatory Visit | Attending: Obstetrics & Gynecology | Admitting: Obstetrics & Gynecology

## 2015-10-27 DIAGNOSIS — Z3A38 38 weeks gestation of pregnancy: Secondary | ICD-10-CM

## 2015-10-27 DIAGNOSIS — Z6841 Body Mass Index (BMI) 40.0 and over, adult: Secondary | ICD-10-CM | POA: Insufficient documentation

## 2015-10-27 DIAGNOSIS — O26843 Uterine size-date discrepancy, third trimester: Secondary | ICD-10-CM

## 2015-10-27 DIAGNOSIS — O99213 Obesity complicating pregnancy, third trimester: Secondary | ICD-10-CM | POA: Insufficient documentation

## 2015-10-30 ENCOUNTER — Ambulatory Visit (INDEPENDENT_AMBULATORY_CARE_PROVIDER_SITE_OTHER): Payer: Medicaid Other | Admitting: Obstetrics & Gynecology

## 2015-10-30 DIAGNOSIS — Z3493 Encounter for supervision of normal pregnancy, unspecified, third trimester: Secondary | ICD-10-CM

## 2015-10-30 DIAGNOSIS — O3660X0 Maternal care for excessive fetal growth, unspecified trimester, not applicable or unspecified: Secondary | ICD-10-CM | POA: Diagnosis not present

## 2015-10-30 LAB — POCT URINALYSIS DIP (DEVICE)
Bilirubin Urine: NEGATIVE
Glucose, UA: NEGATIVE mg/dL
KETONES UR: NEGATIVE mg/dL
Nitrite: NEGATIVE
PH: 7 (ref 5.0–8.0)
PROTEIN: NEGATIVE mg/dL
SPECIFIC GRAVITY, URINE: 1.015 (ref 1.005–1.030)
Urobilinogen, UA: 1 mg/dL (ref 0.0–1.0)

## 2015-10-30 NOTE — Progress Notes (Signed)
Subjective:  [name] is a 30 y.o. G3P2002 at 4436w3d being seen today for ongoing prenatal care.  She is currently monitored for the following issues for this high-risk pregnancy and has Dyspnea; Edema; Asthma; Supervision of low-risk pregnancy; Previous cesarean section; Morbid obesity with BMI of 40.0-44.9, adult (HCC); Asymptomatic bacteriuria, antepartum; and Nephrolithiasis on her problem list.  Patient reports back pain,.  Contractions: Irritability. Vag. Bleeding: None.  Movement: Present. Denies leaking of fluid.   The following portions of the patient's history were reviewed and updated as appropriate: allergies, current medications, past family history, past medical history, past social history, past surgical history and problem list. Problem list updated.  Objective:   Vitals:   10/30/15 1132  BP: 110/74  Pulse: 86  Weight: 255 lb 11.2 oz (116 kg)    Fetal Status: Fetal Heart Rate (bpm): 150   Movement: Present     General:  Alert, oriented and cooperative. Patient is in no acute distress.  Skin: Skin is warm and dry. No rash noted.   Cardiovascular: Normal heart rate noted  Respiratory: Normal respiratory effort, no problems with respiration noted  Abdomen: Soft, gravid, appropriate for gestational age. Pain/Pressure: Present     Pelvic:  Cervical exam performed        Extremities: Normal range of motion.  Edema: None  Mental Status: Normal mood and affect. Normal behavior. Normal judgment and thought content.   Urinalysis: Urine Protein: Negative Urine Glucose: Negative  Assessment and Plan:  Pregnancy: G3P2002 at 836w3d  1. Supervision of low-risk pregnancy, third trimester >90% at 36 weeks with AC >97% Pt wants induction at 39 weeks.  Cervix unfavorable.  Advised of increased risk of C/S with impending macrosomia. Pt has asthma and I suggested she return to [facility] for management but patient refused.      Term labor symptoms and general obstetric  precautions including but not limited to vaginal bleeding, contractions, leaking of fluid and fetal movement were reviewed in detail with the patient. Please refer to After Visit Summary for other counseling recommendations.   RTC 1 week  Lesly DukesKelly H Lev Cervone, MD

## 2015-11-05 ENCOUNTER — Ambulatory Visit (INDEPENDENT_AMBULATORY_CARE_PROVIDER_SITE_OTHER): Payer: Medicaid Other | Admitting: Family Medicine

## 2015-11-05 VITALS — BP 104/70 | HR 97 | Wt 259.0 lb

## 2015-11-05 DIAGNOSIS — Z3493 Encounter for supervision of normal pregnancy, unspecified, third trimester: Secondary | ICD-10-CM

## 2015-11-05 DIAGNOSIS — Z98891 History of uterine scar from previous surgery: Secondary | ICD-10-CM

## 2015-11-05 LAB — POCT URINALYSIS DIP (DEVICE)
Bilirubin Urine: NEGATIVE
Glucose, UA: NEGATIVE mg/dL
KETONES UR: NEGATIVE mg/dL
Nitrite: NEGATIVE
PH: 6.5 (ref 5.0–8.0)
PROTEIN: NEGATIVE mg/dL
SPECIFIC GRAVITY, URINE: 1.015 (ref 1.005–1.030)
UROBILINOGEN UA: 1 mg/dL (ref 0.0–1.0)

## 2015-11-05 NOTE — Patient Instructions (Signed)
Third Trimester of Pregnancy The third trimester is from week 29 through week 42, months 7 through 9. The third trimester is a time when the fetus is growing rapidly. At the end of the ninth month, the fetus is about 20 inches in length and weighs 6-10 pounds.  BODY CHANGES Your body goes through many changes during pregnancy. The changes vary from woman to woman.   Your weight will continue to increase. You can expect to gain 25-35 pounds (11-16 kg) by the end of the pregnancy.  You may begin to get stretch marks on your hips, abdomen, and breasts.  You may urinate more often because the fetus is moving lower into your pelvis and pressing on your bladder.  You may develop or continue to have heartburn as a result of your pregnancy.  You may develop constipation because certain hormones are causing the muscles that push waste through your intestines to slow down.  You may develop hemorrhoids or swollen, bulging veins (varicose veins).  You may have pelvic pain because of the weight gain and pregnancy hormones relaxing your joints between the bones in your pelvis. Backaches may result from overexertion of the muscles supporting your posture.  You may have changes in your hair. These can include thickening of your hair, rapid growth, and changes in texture. Some women also have hair loss during or after pregnancy, or hair that feels dry or thin. Your hair will most likely return to normal after your baby is born.  Your breasts will continue to grow and be tender. A yellow discharge may leak from your breasts called colostrum.  Your belly button may stick out.  You may feel short of breath because of your expanding uterus.  You may notice the fetus "dropping," or moving lower in your abdomen.  You may have a bloody mucus discharge. This usually occurs a few days to a week before labor begins.  Your cervix becomes thin and soft (effaced) near your due date. WHAT TO EXPECT AT YOUR  PRENATAL EXAMS  You will have prenatal exams every 2 weeks until week 36. Then, you will have weekly prenatal exams. During a routine prenatal visit:  You will be weighed to make sure you and the fetus are growing normally.  Your blood pressure is taken.  Your abdomen will be measured to track your baby's growth.  The fetal heartbeat will be listened to.  Any test results from the previous visit will be discussed.  You may have a cervical check near your due date to see if you have effaced. At around 36 weeks, your caregiver will check your cervix. At the same time, your caregiver will also perform a test on the secretions of the vaginal tissue. This test is to determine if a type of bacteria, Group B streptococcus, is present. Your caregiver will explain this further. Your caregiver may ask you:  What your birth plan is.  How you are feeling.  If you are feeling the baby move.  If you have had any abnormal symptoms, such as leaking fluid, bleeding, severe headaches, or abdominal cramping.  If you are using any tobacco products, including cigarettes, chewing tobacco, and electronic cigarettes.  If you have any questions. Other tests or screenings that may be performed during your third trimester include:  Blood tests that check for low iron levels (anemia).  Fetal testing to check the health, activity level, and growth of the fetus. Testing is done if you have certain medical conditions or if   there are problems during the pregnancy.  HIV (human immunodeficiency virus) testing. If you are at high risk, you may be screened for HIV during your third trimester of pregnancy. FALSE LABOR You may feel small, irregular contractions that eventually go away. These are called Braxton Hicks contractions, or false labor. Contractions may last for hours, days, or even weeks before true labor sets in. If contractions come at regular intervals, intensify, or become painful, it is best to be seen  by your caregiver.  SIGNS OF LABOR   Menstrual-like cramps.  Contractions that are 5 minutes apart or less.  Contractions that start on the top of the uterus and spread down to the lower abdomen and back.  A sense of increased pelvic pressure or back pain.  A watery or bloody mucus discharge that comes from the vagina. If you have any of these signs before the 37th week of pregnancy, call your caregiver right away. You need to go to the hospital to get checked immediately. HOME CARE INSTRUCTIONS   Avoid all smoking, herbs, alcohol, and unprescribed drugs. These chemicals affect the formation and growth of the baby.  Do not use any tobacco products, including cigarettes, chewing tobacco, and electronic cigarettes. If you need help quitting, ask your health care provider. You may receive counseling support and other resources to help you quit.  Follow your caregiver's instructions regarding medicine use. There are medicines that are either safe or unsafe to take during pregnancy.  Exercise only as directed by your caregiver. Experiencing uterine cramps is a good sign to stop exercising.  Continue to eat regular, healthy meals.  Wear a good support bra for breast tenderness.  Do not use hot tubs, steam rooms, or saunas.  Wear your seat belt at all times when driving.  Avoid raw meat, uncooked cheese, cat litter boxes, and soil used by cats. These carry germs that can cause birth defects in the baby.  Take your prenatal vitamins.  Take 1500-2000 mg of calcium daily starting at the 20th week of pregnancy until you deliver your baby.  Try taking a stool softener (if your caregiver approves) if you develop constipation. Eat more high-fiber foods, such as fresh vegetables or fruit and whole grains. Drink plenty of fluids to keep your urine clear or pale yellow.  Take warm sitz baths to soothe any pain or discomfort caused by hemorrhoids. Use hemorrhoid cream if your caregiver  approves.  If you develop varicose veins, wear support hose. Elevate your feet for 15 minutes, 3-4 times a day. Limit salt in your diet.  Avoid heavy lifting, wear low heal shoes, and practice good posture.  Rest a lot with your legs elevated if you have leg cramps or low back pain.  Visit your dentist if you have not gone during your pregnancy. Use a soft toothbrush to brush your teeth and be gentle when you floss.  A sexual relationship may be continued unless your caregiver directs you otherwise.  Do not travel far distances unless it is absolutely necessary and only with the approval of your caregiver.  Take prenatal classes to understand, practice, and ask questions about the labor and delivery.  Make a trial run to the hospital.  Pack your hospital bag.  Prepare the baby's nursery.  Continue to go to all your prenatal visits as directed by your caregiver. SEEK MEDICAL CARE IF:  You are unsure if you are in labor or if your water has broken.  You have dizziness.  You have   mild pelvic cramps, pelvic pressure, or nagging pain in your abdominal area.  You have persistent nausea, vomiting, or diarrhea.  You have a bad smelling vaginal discharge.  You have pain with urination. SEEK IMMEDIATE MEDICAL CARE IF:   You have a fever.  You are leaking fluid from your vagina.  You have spotting or bleeding from your vagina.  You have severe abdominal cramping or pain.  You have rapid weight loss or gain.  You have shortness of breath with chest pain.  You notice sudden or extreme swelling of your face, hands, ankles, feet, or legs.  You have not felt your baby move in over an hour.  You have severe headaches that do not go away with medicine.  You have vision changes.   This information is not intended to replace advice given to you by your health care provider. Make sure you discuss any questions you have with your health care provider.   Document Released:  03/02/2001 Document Revised: 03/29/2014 Document Reviewed: 05/09/2012 Elsevier Interactive Patient Education 2016 [employer]  Breastfeeding Deciding to breastfeed is one of the best choices you can make for you and your baby. A change in hormones during pregnancy causes your breast tissue to grow and increases the number and size of your milk ducts. These hormones also allow proteins, sugars, and fats from your blood supply to make breast milk in your milk-producing glands. Hormones prevent breast milk from being released before your baby is born as well as prompt milk flow after birth. Once breastfeeding has begun, thoughts of your baby, as well as his or her sucking or crying, can stimulate the release of milk from your milk-producing glands.  BENEFITS OF BREASTFEEDING For Your Baby  Your first milk (colostrum) helps your baby's digestive system function better.  There are antibodies in your milk that help your baby fight off infections.  Your baby has a lower incidence of asthma, allergies, and sudden infant death syndrome.  The nutrients in breast milk are better for your baby than infant formulas and are designed uniquely for your baby's needs.  Breast milk improves your baby's brain development.  Your baby is less likely to develop other conditions, such as childhood obesity, asthma, or type 2 diabetes mellitus. For You  Breastfeeding helps to create a very special bond between you and your baby.  Breastfeeding is convenient. Breast milk is always available at the correct temperature and costs nothing.  Breastfeeding helps to burn calories and helps you lose the weight gained during pregnancy.  Breastfeeding makes your uterus contract to its prepregnancy size faster and slows bleeding (lochia) after you give birth.   Breastfeeding helps to lower your risk of developing type 2 diabetes mellitus, osteoporosis, and breast or ovarian cancer later in life. SIGNS THAT YOUR BABY IS  HUNGRY Early Signs of Hunger  Increased alertness or activity.  Stretching.  Movement of the head from side to side.  Movement of the head and opening of the mouth when the corner of the mouth or cheek is stroked (rooting).  Increased sucking sounds, smacking lips, cooing, sighing, or squeaking.  Hand-to-mouth movements.  Increased sucking of fingers or hands. Late Signs of Hunger  Fussing.  Intermittent crying. Extreme Signs of Hunger Signs of extreme hunger will require calming and consoling before your baby will be able to breastfeed successfully. Do not wait for the following signs of extreme hunger to occur before you initiate breastfeeding:  Restlessness.  A loud, strong cry.  Screaming.   BREASTFEEDING BASICS Breastfeeding Initiation  Find a comfortable place to sit or lie down, with your neck and back well supported.  Place a pillow or rolled up blanket under your baby to bring him or her to the level of your breast (if you are seated). Nursing pillows are specially designed to help support your arms and your baby while you breastfeed.  Make sure that your baby's abdomen is facing your abdomen.  Gently massage your breast. With your fingertips, massage from your chest wall toward your nipple in a circular motion. This encourages milk flow. You may need to continue this action during the feeding if your milk flows slowly.  Support your breast with 4 fingers underneath and your thumb above your nipple. Make sure your fingers are well away from your nipple and your baby's mouth.  Stroke your baby's lips gently with your finger or nipple.  When your baby's mouth is open wide enough, quickly bring your baby to your breast, placing your entire nipple and as much of the colored area around your nipple (areola) as possible into your baby's mouth.  More areola should be visible above your baby's upper lip than below the lower lip.  Your baby's tongue should be between his  or her lower gum and your breast.  Ensure that your baby's mouth is correctly positioned around your nipple (latched). Your baby's lips should create a seal on your breast and be turned out (everted).  It is common for your baby to suck about 2-3 minutes in order to start the flow of breast milk. Latching Teaching your baby how to latch on to your breast properly is very important. An improper latch can cause nipple pain and decreased milk supply for you and poor weight gain in your baby. Also, if your baby is not latched onto your nipple properly, he or she may swallow some air during feeding. This can make your baby fussy. Burping your baby when you switch breasts during the feeding can help to get rid of the air. However, teaching your baby to latch on properly is still the best way to prevent fussiness from swallowing air while breastfeeding. Signs that your baby has successfully latched on to your nipple:  Silent tugging or silent sucking, without causing you pain.  Swallowing heard between every 3-4 sucks.  Muscle movement above and in front of his or her ears while sucking. Signs that your baby has not successfully latched on to nipple:  Sucking sounds or smacking sounds from your baby while breastfeeding.  Nipple pain. If you think your baby has not latched on correctly, slip your finger into the corner of your baby's mouth to break the suction and place it between your baby's gums. Attempt breastfeeding initiation again. Signs of Successful Breastfeeding Signs from your baby:  A gradual decrease in the number of sucks or complete cessation of sucking.  Falling asleep.  Relaxation of his or her body.  Retention of a small amount of milk in his or her mouth.  Letting go of your breast by himself or herself. Signs from you:  Breasts that have increased in firmness, weight, and size 1-3 hours after feeding.  Breasts that are softer immediately after  breastfeeding.  Increased milk volume, as well as a change in milk consistency and color by the fifth day of breastfeeding.  Nipples that are not sore, cracked, or bleeding. Signs That Your Baby is Getting Enough Milk  Wetting at least 3 diapers in a 24-hour period.   The urine should be clear and pale yellow by age 5 days.  At least 3 stools in a 24-hour period by age 5 days. The stool should be soft and yellow.  At least 3 stools in a 24-hour period by age 7 days. The stool should be seedy and yellow.  No loss of weight greater than 10% of birth weight during the first 3 days of age.  Average weight gain of 4-7 ounces (113-198 g) per week after age 4 days.  Consistent daily weight gain by age 5 days, without weight loss after the age of 2 weeks. After a feeding, your baby may spit up a small amount. This is common. BREASTFEEDING FREQUENCY AND DURATION Frequent feeding will help you make more milk and can prevent sore nipples and breast engorgement. Breastfeed when you feel the need to reduce the fullness of your breasts or when your baby shows signs of hunger. This is called "breastfeeding on demand." Avoid introducing a pacifier to your baby while you are working to establish breastfeeding (the first 4-6 weeks after your baby is born). After this time you may choose to use a pacifier. Research has shown that pacifier use during the first year of a baby's life decreases the risk of sudden infant death syndrome (SIDS). Allow your baby to feed on each breast as long as he or she wants. Breastfeed until your baby is finished feeding. When your baby unlatches or falls asleep while feeding from the first breast, offer the second breast. Because newborns are often sleepy in the first few weeks of life, you may need to awaken your baby to get him or her to feed. Breastfeeding times will vary from baby to baby. However, the following rules can serve as a guide to help you ensure that your baby is  properly fed:  Newborns (babies 4 weeks of age or younger) may breastfeed every 1-3 hours.  Newborns should not go longer than 3 hours during the day or 5 hours during the night without breastfeeding.  You should breastfeed your baby a minimum of 8 times in a 24-hour period until you begin to introduce solid foods to your baby at around 6 months of age. BREAST MILK PUMPING Pumping and storing breast milk allows you to ensure that your baby is exclusively fed your breast milk, even at times when you are unable to breastfeed. This is especially important if you are going back to work while you are still breastfeeding or when you are not able to be present during feedings. Your lactation consultant can give you guidelines on how long it is safe to store breast milk. A breast pump is a machine that allows you to pump milk from your breast into a sterile bottle. The pumped breast milk can then be stored in a refrigerator or freezer. Some breast pumps are operated by hand, while others use electricity. Ask your lactation consultant which type will work best for you. Breast pumps can be purchased, but some hospitals and breastfeeding support groups lease breast pumps on a monthly basis. A lactation consultant can teach you how to hand express breast milk, if you prefer not to use a pump. CARING FOR YOUR BREASTS WHILE YOU BREASTFEED Nipples can become dry, cracked, and sore while breastfeeding. The following recommendations can help keep your breasts moisturized and healthy:  Avoid using soap on your nipples.  Wear a supportive bra. Although not required, special nursing bras and tank tops are designed to allow access to your   breasts for breastfeeding without taking off your entire bra or top. Avoid wearing underwire-style bras or extremely tight bras.  Air dry your nipples for 3-4minutes after each feeding.  Use only cotton bra pads to absorb leaked breast milk. Leaking of breast milk between feedings  is normal.  Use lanolin on your nipples after breastfeeding. Lanolin helps to maintain your skin's normal moisture barrier. If you use pure lanolin, you do not need to wash it off before feeding your baby again. Pure lanolin is not toxic to your baby. You may also hand express a few drops of breast milk and gently massage that milk into your nipples and allow the milk to air dry. In the first few weeks after giving birth, some women experience extremely full breasts (engorgement). Engorgement can make your breasts feel heavy, warm, and tender to the touch. Engorgement peaks within 3-5 days after you give birth. The following recommendations can help ease engorgement:  Completely empty your breasts while breastfeeding or pumping. You may want to start by applying warm, moist heat (in the shower or with warm water-soaked hand towels) just before feeding or pumping. This increases circulation and helps the milk flow. If your baby does not completely empty your breasts while breastfeeding, pump any extra milk after he or she is finished.  Wear a snug bra (nursing or regular) or tank top for 1-2 days to signal your body to slightly decrease milk production.  Apply ice packs to your breasts, unless this is too uncomfortable for you.  Make sure that your baby is latched on and positioned properly while breastfeeding. If engorgement persists after 48 hours of following these recommendations, contact your health care provider or a lactation consultant. OVERALL HEALTH CARE RECOMMENDATIONS WHILE BREASTFEEDING  Eat healthy foods. Alternate between meals and snacks, eating 3 of each per day. Because what you eat affects your breast milk, some of the foods may make your baby more irritable than usual. Avoid eating these foods if you are sure that they are negatively affecting your baby.  Drink milk, fruit juice, and water to satisfy your thirst (about 10 glasses a day).  Rest often, relax, and continue to take  your prenatal vitamins to prevent fatigue, stress, and anemia.  Continue breast self-awareness checks.  Avoid chewing and smoking tobacco. Chemicals from cigarettes that pass into breast milk and exposure to secondhand smoke may harm your baby.  Avoid alcohol and drug use, including marijuana. Some medicines that may be harmful to your baby can pass through breast milk. It is important to ask your health care provider before taking any medicine, including all over-the-counter and prescription medicine as well as vitamin and herbal supplements. It is possible to become pregnant while breastfeeding. If birth control is desired, ask your health care provider about options that will be safe for your baby. SEEK MEDICAL CARE IF:  You feel like you want to stop breastfeeding or have become frustrated with breastfeeding.  You have painful breasts or nipples.  Your nipples are cracked or bleeding.  Your breasts are red, tender, or warm.  You have a swollen area on either breast.  You have a fever or chills.  You have nausea or vomiting.  You have drainage other than breast milk from your nipples.  Your breasts do not become full before feedings by the fifth day after you give birth.  You feel sad and depressed.  Your baby is too sleepy to eat well.  Your baby is having trouble sleeping.     Your baby is wetting less than 3 diapers in a 24-hour period.  Your baby has less than 3 stools in a 24-hour period.  Your baby's skin or the white part of his or her eyes becomes yellow.   Your baby is not gaining weight by 5 days of age. SEEK IMMEDIATE MEDICAL CARE IF:  Your baby is overly tired (lethargic) and does not want to wake up and feed.  Your baby develops an unexplained fever.   This information is not intended to replace advice given to you by your health care provider. Make sure you discuss any questions you have with your health care provider.   Document Released: 03/08/2005  Document Revised: 11/27/2014 Document Reviewed: 08/30/2012 Elsevier Interactive Patient Education 2016 [employer]  

## 2015-11-05 NOTE — Progress Notes (Signed)
Subjective:  [name] is a 30 y.o. G3P2002 at 5948w2d being seen today for ongoing prenatal care.  She is currently monitored for the following issues for this low-risk pregnancy and has Dyspnea; Edema; Asthma; Supervision of low-risk pregnancy; Previous cesarean section; Morbid obesity with BMI of 40.0-44.9, adult (HCC); Asymptomatic bacteriuria, antepartum; Nephrolithiasis; and Large for gestational age fetus affecting management of mother, antepartum on her problem list.  Patient reports no complaints.  Contractions: Irritability. Vag. Bleeding: None.  Movement: Present. Denies leaking of fluid.   The following portions of the patient's history were reviewed and updated as appropriate: allergies, current medications, past family history, past medical history, past social history, past surgical history and problem list. Problem list updated.  Objective:   Vitals:   11/05/15 1622  BP: 104/70  Pulse: 97  Weight: 259 lb (117.5 kg)    Fetal Status: Fetal Heart Rate (bpm): 138 Fundal Height: 44 cm Movement: Present  Presentation: Vertex  General:  Alert, oriented and cooperative. Patient is in no acute distress.  Skin: Skin is warm and dry. No rash noted.   Cardiovascular: Normal heart rate noted  Respiratory: Normal respiratory effort, no problems with respiration noted  Abdomen: Soft, gravid, appropriate for gestational age. Pain/Pressure: Present     Pelvic:  Cervical exam performed Dilation: 1.5 Effacement (%): 80 Station: Ballotable  Extremities: Normal range of motion.  Edema: Mild pitting, slight indentation  Mental Status: Normal mood and affect. Normal behavior. Normal judgment and thought content.   Urinalysis: Urine Protein: Negative Urine Glucose: Negative  Assessment and Plan:  Pregnancy: G3P2002 at 4848w2d  1. Supervision of low-risk pregnancy, third trimester Continue routine prenatal care.   2. Previous cesarean section Desires TOLAC  Term labor symptoms and  general obstetric precautions including but not limited to vaginal bleeding, contractions, leaking of fluid and fetal movement were reviewed in detail with the patient. Please refer to After Visit Summary for other counseling recommendations.  Return in 1 week (on 11/12/2015).   Reva Boresanya S Rickey Sadowski, MD

## 2015-11-11 ENCOUNTER — Inpatient Hospital Stay (HOSPITAL_COMMUNITY)
Admission: AD | Admit: 2015-11-11 | Discharge: 2015-11-11 | Disposition: A | Discharge status: Home / Self Care | Payer: Medicaid Other | Source: Ambulatory Visit | Attending: Family Medicine | Admitting: Family Medicine

## 2015-11-11 ENCOUNTER — Encounter (HOSPITAL_COMMUNITY): Payer: Self-pay | Admitting: *Deleted

## 2015-11-11 DIAGNOSIS — O9989 Other specified diseases and conditions complicating pregnancy, childbirth and the puerperium: Secondary | ICD-10-CM

## 2015-11-11 DIAGNOSIS — Z3A00 Weeks of gestation of pregnancy not specified: Secondary | ICD-10-CM | POA: Insufficient documentation

## 2015-11-11 DIAGNOSIS — O99891 Other specified diseases and conditions complicating pregnancy: Secondary | ICD-10-CM

## 2015-11-11 DIAGNOSIS — R8271 Bacteriuria: Secondary | ICD-10-CM

## 2015-11-11 DIAGNOSIS — Z3483 Encounter for supervision of other normal pregnancy, third trimester: Secondary | ICD-10-CM | POA: Diagnosis not present

## 2015-11-11 DIAGNOSIS — O479 False labor, unspecified: Secondary | ICD-10-CM

## 2015-11-11 DIAGNOSIS — O3660X0 Maternal care for excessive fetal growth, unspecified trimester, not applicable or unspecified: Secondary | ICD-10-CM

## 2015-11-11 DIAGNOSIS — Z3493 Encounter for supervision of normal pregnancy, unspecified, third trimester: Secondary | ICD-10-CM

## 2015-11-11 NOTE — [facility] Note (Signed)
Contractions since last night 

## 2015-11-11 NOTE — Discharge Instructions (Signed)
Keep your scheduled appointment for prenatal care. Call the clinic or provider on call with further concerns or return to [facility] as needed.

## 2015-11-12 ENCOUNTER — Ambulatory Visit (INDEPENDENT_AMBULATORY_CARE_PROVIDER_SITE_OTHER): Payer: Medicaid Other | Admitting: Obstetrics and Gynecology

## 2015-11-12 VITALS — BP 121/69 | HR 89 | Wt 255.0 lb

## 2015-11-12 DIAGNOSIS — Z3493 Encounter for supervision of normal pregnancy, unspecified, third trimester: Secondary | ICD-10-CM

## 2015-11-12 DIAGNOSIS — O34219 Maternal care for unspecified type scar from previous cesarean delivery: Secondary | ICD-10-CM

## 2015-11-12 DIAGNOSIS — Z6841 Body Mass Index (BMI) 40.0 and over, adult: Secondary | ICD-10-CM

## 2015-11-12 DIAGNOSIS — Z98891 History of uterine scar from previous surgery: Secondary | ICD-10-CM

## 2015-11-12 DIAGNOSIS — O99213 Obesity complicating pregnancy, third trimester: Secondary | ICD-10-CM

## 2015-11-12 LAB — POCT URINALYSIS DIP (DEVICE)
BILIRUBIN URINE: NEGATIVE
Glucose, UA: NEGATIVE mg/dL
Hgb urine dipstick: NEGATIVE
KETONES UR: NEGATIVE mg/dL
Nitrite: NEGATIVE
PH: 6.5 (ref 5.0–8.0)
Protein, ur: NEGATIVE mg/dL
SPECIFIC GRAVITY, URINE: 1.015 (ref 1.005–1.030)
Urobilinogen, UA: 1 mg/dL (ref 0.0–1.0)

## 2015-11-12 NOTE — Progress Notes (Signed)
Subjective:  [name] is a 30 y.o. G3P2002 at 3481w2d being seen today for ongoing prenatal care.  She is currently monitored for the following issues for this low-risk pregnancy and has Dyspnea; Edema; Asthma; Supervision of low-risk pregnancy; Previous cesarean section; Morbid obesity with BMI of 40.0-44.9, adult (HCC); Asymptomatic bacteriuria, antepartum; Nephrolithiasis; and Large for gestational age fetus affecting management of mother, antepartum on her problem list.  Patient reports occasional contractions.  Contractions: Irritability. Vag. Bleeding: None.  Movement: Present. Denies leaking of fluid.   The following portions of the patient's history were reviewed and updated as appropriate: allergies, current medications, past family history, past medical history, past social history, past surgical history and problem list. Problem list updated.  Objective:   Vitals:   11/12/15 1611  BP: 121/69  Pulse: 89  Weight: 255 lb (115.7 kg)    Fetal Status: Fetal Heart Rate (bpm): 135   Movement: Present     General:  Alert, oriented and cooperative. Patient is in no acute distress.  Skin: Skin is warm and dry. No rash noted.   Cardiovascular: Normal heart rate noted  Respiratory: Normal respiratory effort, no problems with respiration noted  Abdomen: Soft, gravid, appropriate for gestational age. Pain/Pressure: Present     Pelvic:  Cervical exam performed        Extremities: Normal range of motion.  Edema: Trace  Mental Status: Normal mood and affect. Normal behavior. Normal judgment and thought content.   Urinalysis: Urine Protein: Negative Urine Glucose: Negative  Assessment and Plan:  Pregnancy: [id number] at 881w2d Prior c section, successful VBAC, desires TOLAC, consent signed, IOL scheduled for Monday Post dates NST on Friday  There are no diagnoses linked to this encounter. Term labor symptoms and general obstetric precautions including but not limited to vaginal bleeding,  contractions, leaking of fluid and fetal movement were reviewed in detail with the patient. Please refer to After Visit Summary for other counseling recommendations.  No Follow-up on file.   Hermina StaggersMichael L Lizzet Hendley, MD

## 2015-11-13 ENCOUNTER — Encounter: Payer: Self-pay | Admitting: *Deleted

## 2015-11-13 ENCOUNTER — Telehealth (HOSPITAL_COMMUNITY): Payer: Self-pay | Admitting: *Deleted

## 2015-11-13 NOTE — Telephone Encounter (Signed)
Preadmission screen  

## 2015-11-14 ENCOUNTER — Ambulatory Visit (INDEPENDENT_AMBULATORY_CARE_PROVIDER_SITE_OTHER): Payer: Medicaid Other | Admitting: Obstetrics & Gynecology

## 2015-11-14 VITALS — BP 118/73 | HR 98

## 2015-11-14 DIAGNOSIS — Z36 Encounter for antenatal screening of mother: Secondary | ICD-10-CM | POA: Diagnosis not present

## 2015-11-14 DIAGNOSIS — O480 Post-term pregnancy: Secondary | ICD-10-CM

## 2015-11-14 NOTE — Progress Notes (Signed)
NST reviewed and reactive.  Daphney Hopke L. Harraway-Smith, M.D., FACOG    

## 2015-11-16 ENCOUNTER — Inpatient Hospital Stay (HOSPITAL_COMMUNITY)
Admission: AD | Admit: 2015-11-16 | Discharge: 2015-11-18 | DRG: 775 | Disposition: A | Discharge status: Home / Self Care | Payer: Medicaid Other | Source: Ambulatory Visit | Attending: Obstetrics & Gynecology | Admitting: Obstetrics & Gynecology

## 2015-11-16 ENCOUNTER — Encounter (HOSPITAL_COMMUNITY): Payer: Self-pay

## 2015-11-16 ENCOUNTER — Inpatient Hospital Stay (HOSPITAL_COMMUNITY): Payer: Medicaid Other | Admitting: Anesthesiology

## 2015-11-16 DIAGNOSIS — J45909 Unspecified asthma, uncomplicated: Secondary | ICD-10-CM | POA: Diagnosis present

## 2015-11-16 DIAGNOSIS — O9952 Diseases of the respiratory system complicating childbirth: Secondary | ICD-10-CM | POA: Diagnosis present

## 2015-11-16 DIAGNOSIS — Z6841 Body Mass Index (BMI) 40.0 and over, adult: Secondary | ICD-10-CM

## 2015-11-16 DIAGNOSIS — O34211 Maternal care for low transverse scar from previous cesarean delivery: Secondary | ICD-10-CM | POA: Diagnosis present

## 2015-11-16 DIAGNOSIS — R8271 Bacteriuria: Secondary | ICD-10-CM

## 2015-11-16 DIAGNOSIS — O99214 Obesity complicating childbirth: Secondary | ICD-10-CM | POA: Diagnosis present

## 2015-11-16 DIAGNOSIS — O9989 Other specified diseases and conditions complicating pregnancy, childbirth and the puerperium: Secondary | ICD-10-CM

## 2015-11-16 DIAGNOSIS — IMO0001 Reserved for inherently not codable concepts without codable children: Secondary | ICD-10-CM

## 2015-11-16 DIAGNOSIS — Z87891 Personal history of nicotine dependence: Secondary | ICD-10-CM

## 2015-11-16 DIAGNOSIS — O3660X0 Maternal care for excessive fetal growth, unspecified trimester, not applicable or unspecified: Secondary | ICD-10-CM

## 2015-11-16 DIAGNOSIS — O3663X0 Maternal care for excessive fetal growth, third trimester, not applicable or unspecified: Principal | ICD-10-CM | POA: Diagnosis present

## 2015-11-16 DIAGNOSIS — Z3A40 40 weeks gestation of pregnancy: Secondary | ICD-10-CM | POA: Diagnosis not present

## 2015-11-16 DIAGNOSIS — Z3483 Encounter for supervision of other normal pregnancy, third trimester: Secondary | ICD-10-CM | POA: Diagnosis present

## 2015-11-16 DIAGNOSIS — O4290 Premature rupture of membranes, unspecified as to length of time between rupture and onset of labor, unspecified weeks of gestation: Secondary | ICD-10-CM | POA: Diagnosis present

## 2015-11-16 DIAGNOSIS — Z3493 Encounter for supervision of normal pregnancy, unspecified, third trimester: Secondary | ICD-10-CM

## 2015-11-16 LAB — CBC
HCT: 32.5 % — ABNORMAL LOW (ref 36.0–46.0)
HEMOGLOBIN: 11 g/dL — AB (ref 12.0–15.0)
MCH: 28.2 pg (ref 26.0–34.0)
MCHC: 33.8 g/dL (ref 30.0–36.0)
MCV: 83.3 fL (ref 78.0–100.0)
PLATELETS: 270 10*3/uL (ref 150–400)
RBC: 3.9 MIL/uL (ref 3.87–5.11)
RDW: 14.5 % (ref 11.5–15.5)
WBC: 6.1 10*3/uL (ref 4.0–10.5)

## 2015-11-16 LAB — RPR: RPR Ser Ql: NONREACTIVE

## 2015-11-16 LAB — TYPE AND SCREEN
ABO/RH(D): O POS
ANTIBODY SCREEN: NEGATIVE

## 2015-11-16 MED ORDER — EPHEDRINE 5 MG/ML INJ
10.0000 mg | INTRAVENOUS | Status: DC | PRN
  Filled 2015-11-16: qty 4

## 2015-11-16 MED ORDER — BENZOCAINE-MENTHOL 20-0.5 % EX AERO
1.0000 "application " | INHALATION_SPRAY | CUTANEOUS | Status: DC | PRN
  Administered 2015-11-17: 1 via TOPICAL
  Filled 2015-11-16: qty 56

## 2015-11-16 MED ORDER — SIMETHICONE 80 MG PO CHEW
80.0000 mg | CHEWABLE_TABLET | ORAL | Status: DC | PRN
  Filled 2015-11-16: qty 1

## 2015-11-16 MED ORDER — FLEET ENEMA 7-19 GM/118ML RE ENEM: 1.0000 | ENEMA | RECTAL | Status: DC | PRN

## 2015-11-16 MED ORDER — TERBUTALINE SULFATE 1 MG/ML IJ SOLN: 0.2500 mg | Freq: Once | INTRAMUSCULAR | Status: DC

## 2015-11-16 MED ORDER — OXYCODONE-ACETAMINOPHEN 5-325 MG PO TABS: 2.0000 | ORAL_TABLET | ORAL | Status: DC | PRN

## 2015-11-16 MED ORDER — SODIUM CHLORIDE 0.9 % IV SOLN: 250.0000 mL | INTRAVENOUS | Status: DC | PRN

## 2015-11-16 MED ORDER — OXYTOCIN 40 UNITS IN LACTATED RINGERS INFUSION - SIMPLE MED
2.5000 [IU]/h | INTRAVENOUS | Status: DC
  Filled 2015-11-16: qty 1000

## 2015-11-16 MED ORDER — ZOLPIDEM TARTRATE 5 MG PO TABS: 5.0000 mg | ORAL_TABLET | Freq: Every evening | ORAL | Status: DC | PRN

## 2015-11-16 MED ORDER — ACETAMINOPHEN 325 MG PO TABS
650.0000 mg | ORAL_TABLET | ORAL | Status: DC | PRN
  Administered 2015-11-16 – 2015-11-17 (×2): 650 mg via ORAL
  Filled 2015-11-16 (×2): qty 2

## 2015-11-16 MED ORDER — IBUPROFEN 600 MG PO TABS
600.0000 mg | ORAL_TABLET | Freq: Four times a day (QID) | ORAL | Status: DC
  Administered 2015-11-16 – 2015-11-18 (×9): 600 mg via ORAL
  Filled 2015-11-16 (×9): qty 1

## 2015-11-16 MED ORDER — LACTATED RINGERS IV SOLN: INTRAVENOUS | Status: DC

## 2015-11-16 MED ORDER — OXYTOCIN 40 UNITS IN LACTATED RINGERS INFUSION - SIMPLE MED
1.0000 m[IU]/min | INTRAVENOUS | Status: DC
  Administered 2015-11-16: 2 m[IU]/min via INTRAVENOUS
  Filled 2015-11-16: qty 1000

## 2015-11-16 MED ORDER — DIPHENHYDRAMINE HCL 50 MG/ML IJ SOLN: 12.5000 mg | INTRAMUSCULAR | Status: DC | PRN

## 2015-11-16 MED ORDER — COCONUT OIL OIL
1.0000 "application " | TOPICAL_OIL | Status: DC | PRN
  Administered 2015-11-16: 1 via TOPICAL
  Filled 2015-11-16: qty 120

## 2015-11-16 MED ORDER — PHENYLEPHRINE 40 MCG/ML (10ML) SYRINGE FOR IV PUSH (FOR BLOOD PRESSURE SUPPORT)
80.0000 ug | PREFILLED_SYRINGE | INTRAVENOUS | Status: DC | PRN
  Filled 2015-11-16: qty 5

## 2015-11-16 MED ORDER — FENTANYL CITRATE (PF) 100 MCG/2ML IJ SOLN: 100.0000 ug | INTRAMUSCULAR | Status: DC | PRN

## 2015-11-16 MED ORDER — ACETAMINOPHEN 325 MG PO TABS
650.0000 mg | ORAL_TABLET | ORAL | Status: DC | PRN
  Filled 2015-11-16: qty 2

## 2015-11-16 MED ORDER — LACTATED RINGERS IV SOLN: 500.0000 mL | Freq: Once | INTRAVENOUS | Status: DC

## 2015-11-16 MED ORDER — BUPIVACAINE HCL (PF) 0.25 % IJ SOLN
INTRAMUSCULAR | Status: DC | PRN
  Administered 2015-11-16 (×2): 5 mL via EPIDURAL

## 2015-11-16 MED ORDER — ONDANSETRON HCL 4 MG/2ML IJ SOLN: 4.0000 mg | Freq: Four times a day (QID) | INTRAMUSCULAR | Status: DC | PRN

## 2015-11-16 MED ORDER — PHENYLEPHRINE 40 MCG/ML (10ML) SYRINGE FOR IV PUSH (FOR BLOOD PRESSURE SUPPORT)
PREFILLED_SYRINGE | INTRAVENOUS | Status: AC
  Filled 2015-11-16: qty 10

## 2015-11-16 MED ORDER — MEASLES, MUMPS & RUBELLA VAC ~~LOC~~ INJ: 0.5000 mL | INJECTION | Freq: Once | SUBCUTANEOUS | Status: DC

## 2015-11-16 MED ORDER — SODIUM CHLORIDE 0.9% FLUSH: 3.0000 mL | Freq: Two times a day (BID) | INTRAVENOUS | Status: DC

## 2015-11-16 MED ORDER — LIDOCAINE HCL (PF) 1 % IJ SOLN
30.0000 mL | INTRAMUSCULAR | Status: DC | PRN
  Filled 2015-11-16: qty 30

## 2015-11-16 MED ORDER — SOD CITRATE-CITRIC ACID 500-334 MG/5ML PO SOLN: 30.0000 mL | ORAL | Status: DC | PRN

## 2015-11-16 MED ORDER — LACTATED RINGERS IV SOLN
500.0000 mL | INTRAVENOUS | Status: DC | PRN
  Administered 2015-11-16: 500 mL via INTRAVENOUS

## 2015-11-16 MED ORDER — FENTANYL 2.5 MCG/ML BUPIVACAINE 1/10 % EPIDURAL INFUSION (WH - ANES)
14.0000 mL/h | INTRAMUSCULAR | Status: DC | PRN
  Administered 2015-11-16: 14 mL/h via EPIDURAL

## 2015-11-16 MED ORDER — TERBUTALINE SULFATE 1 MG/ML IJ SOLN
0.2500 mg | Freq: Once | INTRAMUSCULAR | Status: DC | PRN
Start: 2015-11-16 — End: 2015-11-16
  Filled 2015-11-16: qty 1

## 2015-11-16 MED ORDER — TETANUS-DIPHTH-ACELL PERTUSSIS 5-2.5-18.5 LF-MCG/0.5 IM SUSP: 0.5000 mL | Freq: Once | INTRAMUSCULAR | Status: DC

## 2015-11-16 MED ORDER — TERBUTALINE SULFATE 1 MG/ML IJ SOLN
INTRAMUSCULAR | Status: AC
  Administered 2015-11-16: 0.25 mg
  Filled 2015-11-16: qty 1

## 2015-11-16 MED ORDER — LIDOCAINE HCL (PF) 1 % IJ SOLN
INTRAMUSCULAR | Status: DC | PRN
  Administered 2015-11-16 (×2): 5 mL

## 2015-11-16 MED ORDER — ONDANSETRON HCL 4 MG/2ML IJ SOLN: 4.0000 mg | INTRAMUSCULAR | Status: DC | PRN

## 2015-11-16 MED ORDER — SENNOSIDES-DOCUSATE SODIUM 8.6-50 MG PO TABS
2.0000 | ORAL_TABLET | ORAL | Status: DC
  Administered 2015-11-17 – 2015-11-18 (×2): 2 via ORAL
  Filled 2015-11-16 (×2): qty 2

## 2015-11-16 MED ORDER — DIPHENHYDRAMINE HCL 25 MG PO CAPS: 25.0000 mg | ORAL_CAPSULE | Freq: Four times a day (QID) | ORAL | Status: DC | PRN

## 2015-11-16 MED ORDER — PRENATAL MULTIVITAMIN CH
1.0000 | ORAL_TABLET | Freq: Every day | ORAL | Status: DC
  Administered 2015-11-17 – 2015-11-18 (×2): 1 via ORAL
  Filled 2015-11-16 (×2): qty 1

## 2015-11-16 MED ORDER — OXYTOCIN BOLUS FROM INFUSION
500.0000 mL | Freq: Once | INTRAVENOUS | Status: AC
  Administered 2015-11-16: 500 mL via INTRAVENOUS

## 2015-11-16 MED ORDER — SODIUM CHLORIDE 0.9% FLUSH: 3.0000 mL | INTRAVENOUS | Status: DC | PRN

## 2015-11-16 MED ORDER — OXYCODONE-ACETAMINOPHEN 5-325 MG PO TABS: 1.0000 | ORAL_TABLET | ORAL | Status: DC | PRN

## 2015-11-16 MED ORDER — FENTANYL 2.5 MCG/ML BUPIVACAINE 1/10 % EPIDURAL INFUSION (WH - ANES)
INTRAMUSCULAR | Status: AC
  Filled 2015-11-16: qty 125

## 2015-11-16 MED ORDER — ONDANSETRON HCL 4 MG PO TABS: 4.0000 mg | ORAL_TABLET | ORAL | Status: DC | PRN

## 2015-11-16 MED ORDER — LACTATED RINGERS IV SOLN
INTRAVENOUS | Status: DC
  Administered 2015-11-16: 05:00:00 via INTRAVENOUS

## 2015-11-16 NOTE — Anesthesia Preprocedure Evaluation (Signed)
Anesthesia Evaluation  Patient identified by MRN, date of birth, ID band Patient awake    Reviewed: Allergy & Precautions, H&P , NPO status , Patient's Chart, lab work & pertinent test results  History of Anesthesia Complications Negative for: history of anesthetic complications  Airway Mallampati: II  TM Distance: >3 FB Neck ROM: full    Dental no notable dental hx. (+) Teeth Intact   Pulmonary asthma , former smoker,    Pulmonary exam normal breath sounds clear to auscultation       Cardiovascular negative cardio ROS Normal cardiovascular exam Rhythm:regular Rate:Normal     Neuro/Psych negative neurological ROS  negative psych ROS   GI/Hepatic negative GI ROS, Neg liver ROS,   Endo/Other  Morbid obesity  Renal/GU negative Renal ROS  negative genitourinary   Musculoskeletal   Abdominal   Peds  Hematology negative hematology ROS (+)   Anesthesia Other Findings   Reproductive/Obstetrics (+) Pregnancy                             Anesthesia Physical Anesthesia Plan  ASA: II  Anesthesia Plan: Epidural   Post-op Pain Management:    Induction:   Airway Management Planned:   Additional Equipment:   Intra-op Plan:   Post-operative Plan:   Informed Consent: I have reviewed the patients History and Physical, chart, labs and discussed the procedure including the risks, benefits and alternatives for the proposed anesthesia with the patient or authorized representative who has indicated his/her understanding and acceptance.     Plan Discussed with:   Anesthesia Plan Comments:         Anesthesia Quick Evaluation  

## 2015-11-16 NOTE — Progress Notes (Signed)
Patient ID: Caitlin GrumblingAlecia R Howard, female   DOB: 08/27/1985, 30 y.o.   MRN: [id number] Pt having repetitive variables, increased bloody show and pressure with contractions.  SVE done, 8/90/-1 FSE and IUPC placed,  Amnioinfusion started, pt repositioned side to side,  Contractions every 2 minutes, Terb given Dr Debroah LoopArnold aware Contractions started spacing to 2-4 minutes, FHT 150 without decels Plan; Cont to monitor

## 2015-11-16 NOTE — MAU Note (Signed)
SROM at 0140. Contractions.

## 2015-11-16 NOTE — Progress Notes (Signed)
IFSE removed with tip intact 

## 2015-11-16 NOTE — Progress Notes (Signed)
Patient ID: [name], female   DOB: 07/18/1985, 30 y.o.   MRN: [id number] [name] is a 30 y.o. G3P2002 at 4691w6d.  Subjective: Increased pressure w/ UC's  Objective: BP 131/83   Pulse 89   Temp 97.7 F (36.5 C) (Axillary)   Resp 18   Ht 5' 3.5" (1.613 m)   Wt 247 lb (112 kg)   LMP 02/03/2015   SpO2 100%   BMI 43.07 kg/m    FHT:  FHR: 145 bpm, variability: min-mod,  accelerations:  10x10,  decelerations:  earlies UC:   Q 2-4 minutes, strong Dilation: 9 Effacement (%): 100 Cervical Position: Posterior Station: -1 Presentation: Vertex Exam by:: [name] CNM  Labs: Na  Assessment / Plan: 7591w6d week IUP Labor: Transition, progressing well Fetal Wellbeing:  Category II, overall reassuring Pain Control:  Epidural Anticipated MOD:  SVD. CTO labor progress closely 2/2 sus fetal macrosomia  [name], [place] 11/16/2015 9:03 AM

## 2015-11-16 NOTE — H&P (Signed)
HPI: [name] is a 30 y.o. year old G81P2002 female at [redacted]w[redacted]d weeks gestation who presents to MAU reporting Spontaneous rupture of membranes at 0130, Labor Meconium-stained fluid. Previous C/S for FTP 6-9 baby followed by successful VBAC of 7-10 baby. Desires TOLAC. Consent under Media tab.    Clinic  Low Risk Prenatal Labs  Dating  LMP Blood type: --/--/O POS, O POS (06/09 1829) O pos  Genetic Screen Declined  Antibody:NEG (06/09 1829)neg  Anatomic [place] 19 wks normal but limited, rescan normel; 36 weeks scan shows FOCI in GALLBLADDER, LGA at >90%. Rubella: 2.52 (02/14 1533)Immune  GTT Early:    107           Third trimester: 5/31 - 108 RPR: NON REAC (05/31 0001) NR  Flu vaccine 05/06/15 HBsAg: NEGATIVE (02/14 1533) Neg  TDaP vaccine   5/31                                HIV: NONREACTIVE (05/31 0001) NR  Baby Food   Breast                                          GBS: Negative  Contraception  Mirena or Nex Pap:WNL  Circumcision    Pediatrician  [facility] Hyacinth Meeker)   Support Person  Marcus (FOB)    Patient Active Problem List   Diagnosis Date Noted  . Leakage of amniotic fluid 11/16/2015  . Large for gestational age fetus affecting management of mother, antepartum 10/30/2015  . Nephrolithiasis 10/06/2015  . Asymptomatic bacteriuria, antepartum 05/24/2015  . Previous cesarean section 05/06/2015  . Morbid obesity with BMI of 40.0-44.9, adult (HCC) 05/06/2015  . Supervision of low-risk pregnancy 04/25/2015  . Asthma 08/05/2011  . Dyspnea 07/07/2011  . Edema 07/07/2011     OB History    Gravida Para Term Preterm AB Living   3 2 2  0 0 2   SAB TAB Ectopic Multiple Live Births   0 0 0 0 2     Past Medical History:  Diagnosis Date  . Asthma   . Headache(784.0)   . Pyelonephritis affecting pregnancy in third trimester, antepartum 08/29/2015  . Seasonal allergies    Past Surgical History:  Procedure Laterality Date  . CESAREAN SECTION    . TONSILLECTOMY    . WISDOM TOOTH  EXTRACTION     Family History: She was adopted. Family history is unknown by patient. Social History:  reports that she quit smoking about 7 months ago. Her smoking use included Cigarettes. She has a 1.50 pack-year smoking history. She has never used smokeless tobacco. She reports that she does not drink alcohol or use drugs.     Maternal Diabetes: No Genetic Screening: Declined Maternal Ultrasounds/Referrals: Abnormal:  Findings:   Other: FOCI in fetal GALLBLADDER Fetal Ultrasounds or other Referrals:  None Maternal Substance Abuse:  No Significant Maternal Medications:  None Significant Maternal Lab Results:  Lab values include: Group B Strep negative Other Comments:  None  Review of Systems  Constitutional: Negative for chills and fever.  Eyes: Negative for blurred vision.  Gastrointestinal: Positive for abdominal pain (contractions).       Leaking green fluid since 0130. Neg for VB.  Neurological: Negative for headaches.   Maternal Medical History:  Reason for admission: Rupture of membranes and contractions.  Contractions: Onset was 1-2 hours ago.   Frequency: regular.   Perceived severity is strong.    Fetal activity: Perceived fetal activity is normal.   Last perceived fetal movement was within the past hour.    Prenatal complications: Suspected fetal macrosomia  Prenatal Complications - Diabetes: none.    Dilation: 3 Effacement (%): 100 Station: -2 Exam by:: Avery DennisonBenji Stanley RN Blood pressure 148/83, pulse 84, temperature 97.3 F (36.3 C), resp. rate 22, height 5' 3.5" (1.613 m), weight 247 lb (112 kg), last menstrual period 02/03/2015. Maternal Exam:  Uterine Assessment: Contraction strength is firm.  Contraction frequency is regular.   Abdomen: Patient reports no abdominal tenderness. Estimated fetal weight is 8-1 on 10/27/15-->~9-8 today.   Fetal presentation: vertex     Fetal Exam Fetal Monitor Review: Mode: ultrasound.   Baseline rate: 130.   Variability: moderate (6-25 bpm).   Pattern: accelerations present and no decelerations.    Fetal State Assessment: Category I - tracings are normal.     Physical Exam  Constitutional: She is oriented to person, place, and time. She appears well-developed and well-nourished. She appears distressed.  Eyes: Conjunctivae are normal.  Cardiovascular: Normal rate and regular rhythm.   Respiratory: Effort normal.  GI: Soft. There is no tenderness.  Musculoskeletal: She exhibits edema.  Neurological: She is alert and oriented to person, place, and time. She has normal reflexes.  Skin: Skin is warm and dry.  Psychiatric: She has a normal mood and affect.    Prenatal labs: ABO, Rh: --/--/O POS, O POS (06/09 1829) Antibody: NEG (06/09 1829) Rubella: 2.52 (02/14 1533) RPR: NON REAC (05/31 0001)  HBsAg: NEGATIVE (02/14 1533)  HIV: NONREACTIVE (05/31 0001)  GBS:   Negative  Assessment: 1. Labor: active 2. Fetal Wellbeing: Category I  3. Pain Control: Requesting epidural 4. GBS: Neg 5. 40.6 week IUP 6. Prior C/S, desires TOLAC 7. MSF 8. Suspected fetal macrosomia  Plan:  1. Admit to BS per consult with MD 2. Routine L&D orders 3. Analgesia/anesthesia PRN  4.TOLAC 5. Discussed concerns about suspected fetal macrosomia including AC measuring >97% being associated w/ increased risk of shoulder dystocia. Pt wishes to proceed w/ TOLAC.    Dorathy KinsmanVirginia Hikari Tripp 11/16/2015, 3:28 AM

## 2015-11-16 NOTE — Progress Notes (Signed)
[name] is a 30 y.o. G3P2002 at 438w6d by ultrasound admitted for active labor  Subjective:   Objective: BP 131/83   Pulse 89   Temp 97.7 F (36.5 C) (Axillary)   Resp 18   Ht 5' 3.5" (1.613 m)   Wt 112 kg (247 lb)   LMP 02/03/2015   SpO2 100%   BMI 43.07 kg/m  No intake/output data recorded. No intake/output data recorded.  FHT:  FHR: 130's bpm, variability: moderate,  accelerations:  Present,  decelerations:  Absent UC:   regular, every 3-4 minutes SVE:   Dilation: 10 Effacement (%): 100 Station: +1 Exam by:: D [name] CNM  Labs: Lab Results  Component Value Date   WBC 6.1 11/16/2015   HGB 11.0 (L) 11/16/2015   HCT 32.5 (L) 11/16/2015   MCV 83.3 11/16/2015   PLT 270 11/16/2015    Assessment / Plan: Spontaneous labor, progressing normally  Labor: Progressing normally Preeclampsia:  no signs or symptoms of toxicity, intake and ouput balanced and labs stable Fetal Wellbeing:  Category II Pain Control:  Epidural I/D:  n/a Anticipated MOD:  anticipate SVD, suspect macro.   [name] 11/16/2015, 9:17 AM

## 2015-11-16 NOTE — Lactation Note (Signed)
This note was copied from a baby's chart. Lactation Consultation Note  Patient Name: [name] ZOXWR'UToday's Date: 11/16/2015 Reason for consult: Initial assessment Baby at 8 hr of life. Mom has a dark horizontal compression stripe across the surface of the L nipple. The R nipple appears normal. Demonstrated cross cradle and discussed nipple care. Limited education done because mom had multiple telephone conversations while lactation was present. Left handouts. Mom will call as needed.    Maternal Data    Feeding Feeding Type: Breast Fed Length of feed: 5 min  LATCH Score/Interventions Latch: Grasps breast easily, tongue down, lips flanged, rhythmical sucking.  Audible Swallowing: A few with stimulation Intervention(s): Skin to skin;Hand expression;Alternate breast massage  Type of Nipple: Everted at rest and after stimulation  Comfort (Breast/Nipple): Filling, red/small blisters or bruises, mild/mod discomfort  Problem noted: Mild/Moderate discomfort;Cracked, bleeding, blisters, bruises Interventions  (Cracked/bleeding/bruising/blister): Expressed breast milk to nipple Interventions (Mild/moderate discomfort):  (coconut oil )  Hold (Positioning): Assistance needed to correctly position infant at breast and maintain latch. Intervention(s): Support Pillows;Position options  LATCH Score: 7  Lactation Tools Discussed/Used     Consult Status Consult Status: Follow-up Date: 11/17/15 Follow-up type: In-patient    Rulon Eisenmengerlizabeth E Janika Jedlicka 11/16/2015, 6:26 PM

## 2015-11-16 NOTE — Anesthesia Procedure Notes (Signed)
Epidural Patient location during procedure: OB  Staffing Anesthesiologist: Lukasz Rogus Performed: anesthesiologist   Preanesthetic Checklist Completed: patient identified, site marked, surgical consent, pre-op evaluation, timeout performed, IV checked, risks and benefits discussed and monitors and equipment checked  Epidural Patient position: sitting Prep: DuraPrep Patient monitoring: heart rate, continuous pulse ox and blood pressure Approach: right paramedian Location: L2-L3 Injection technique: LOR saline  Needle:  Needle type: Tuohy  Needle gauge: 17 G Needle length: 9 cm and 9 Needle insertion depth: 6 cm Catheter type: closed end flexible Catheter size: 20 Guage Catheter at skin depth: 10 cm Test dose: negative  Assessment Events: blood not aspirated, injection not painful, no injection resistance, negative IV test and no paresthesia  Additional Notes Patient identified. Risks/Benefits/Options discussed with patient including but not limited to bleeding, infection, nerve damage, paralysis, failed block, incomplete pain control, headache, blood pressure changes, nausea, vomiting, reactions to medication both or allergic, itching and postpartum back pain. Confirmed with bedside nurse the patient's most recent platelet count. Confirmed with patient that they are not currently taking any anticoagulation, have any bleeding history or any family history of bleeding disorders. Patient expressed understanding and wished to proceed. All questions were answered. Sterile technique was used throughout the entire procedure. Please see nursing notes for vital signs. Test dose was given through epidural needle and negative prior to continuing to dose epidural or start infusion. Warning signs of high block given to the patient including shortness of breath, tingling/numbness in hands, complete motor block, or any concerning symptoms with instructions to call for help. Patient was given  instructions on fall risk and not to get out of bed. All questions and concerns addressed with instructions to call with any issues.     

## 2015-11-16 NOTE — Progress Notes (Signed)
IUPC no longer in place,lying in vagina

## 2015-11-17 ENCOUNTER — Inpatient Hospital Stay (HOSPITAL_COMMUNITY): Admission: RE | Admit: 2015-11-17 | Payer: Medicaid Other | Source: Ambulatory Visit

## 2015-11-17 MED ORDER — OXYCODONE-ACETAMINOPHEN 5-325 MG PO TABS
1.0000 | ORAL_TABLET | ORAL | Status: DC | PRN
  Administered 2015-11-17 (×2): 1 via ORAL
  Administered 2015-11-18 (×2): 2 via ORAL
  Filled 2015-11-17 (×2): qty 2
  Filled 2015-11-17 (×2): qty 1

## 2015-11-17 NOTE — Progress Notes (Signed)
POSTPARTUM PROGRESS NOTE  Post Partum Day 1 Subjective:  [name] is a 30 y.o. [id number] 1368w6d s/p VBAC.  No acute events overnight.  Pt denies problems with ambulating, voiding or po intake.  She denies nausea or vomiting.  Pain is moderately controlled.  She has had flatus. She has not had bowel movement.  Lochia Small.   Objective: Blood pressure 117/75, pulse 71, temperature 97.9 F (36.6 C), temperature source Oral, resp. rate 18, height 5' 3.5" (1.613 m), weight 247 lb (112 kg), last menstrual period 02/03/2015, SpO2 99 %, unknown if currently breastfeeding.  Physical Exam:  General: alert, cooperative and no distress Lochia:normal flow Chest: no respiratory distress Heart:regular rate, distal pulses intact Abdomen: soft, nontender,  Uterine Fundus: firm, appropriately tender DVT Evaluation: No calf swelling or tenderness Extremities: trace edema   Recent Labs  11/16/15 0350  HGB 11.0*  HCT 32.5*    Assessment/Plan:  ASSESSMENT: [name] is a 30 y.o. [id number] 4768w6d s/p VBAC. No complications  Plan for discharge tomorrow   LOS: 1 day   Les Pouicholas SchenkMD 11/17/2015, 9:34 AM

## 2015-11-17 NOTE — Anesthesia Postprocedure Evaluation (Signed)
Anesthesia Post Note  Patient: Talbot GrumblingAlecia R Spivey  Procedure(s) Performed: * No procedures listed *  Patient location during evaluation: Mother Baby Anesthesia Type: Epidural Level of consciousness: awake and alert and oriented Pain management: pain level controlled Vital Signs Assessment: post-procedure vital signs reviewed and stable Respiratory status: spontaneous breathing and nonlabored ventilation Cardiovascular status: stable Postop Assessment: no headache, no backache, patient able to bend at knees, no signs of nausea or vomiting and adequate PO intake Anesthetic complications: no Comments: Patient stated epidural didn't work. Multiple interventions by MD and Rns. Patient stated pain level was 8 after placement. Currently no pain.     Last Vitals:  Vitals:   11/16/15 1640 11/17/15 0500  BP: (!) 122/57 117/75  Pulse: 74 71  Resp: 18 18  Temp: 37.3 C 36.6 C    Last Pain:  Vitals:   11/17/15 0601  TempSrc:   PainSc: 7    Pain Goal: Patients Stated Pain Goal: 3 (11/16/15 1824)               [facility] CreekEIGHT,Khamani Fairley

## 2015-11-17 NOTE — Progress Notes (Signed)
Post Partum Day 1 Subjective: Ms. [name] is a 30yo G3P3003 PPD #1 post SVD.  She has experienced some cramping since delivery and denies headache, nausea or vomiting. up ad lib, voiding, tolerating PO and constipation. She did mention some constipation but was able to have a bowel movement yesterday.  She states that the nurse suggested fiber.  Pt reports some bleeding but no clots.    She had a lactation consult yesterday morning and is doing well with breast feeding. Plans on Mirena for birth control.  Objective: Blood pressure 117/75, pulse 71, temperature 97.9 F (36.6 C), temperature source Oral, resp. rate 18, height 5' 3.5" (1.613 m), weight 112 kg (247 lb), last menstrual period 02/03/2015, SpO2 99 %, unknown if currently breastfeeding.  Physical Exam:  General: alert and cooperative Lochia: appropriate Uterine Fundus: firm DVT Evaluation: No evidence of DVT seen on physical exam. No cords or calf tenderness.   Recent Labs  11/16/15 0350  HGB 11.0*  HCT 32.5*    Assessment/Plan: Plan for discharge tomorrow   LOS: 1 day   Caitlin Coltichard Uriah Trueba  PA-S 11/17/2015, 7:39 AM

## 2015-11-17 NOTE — Plan of Care (Signed)
Problem: Life Cycle: Goal: Risk for postpartum hemorrhage will decrease Outcome: Completed/Met Date Met: 11/17/15 Encouraged patient to call if bleeding became heavier or she noticed large gushes or clots.

## 2015-11-17 NOTE — Progress Notes (Signed)
UR chart review completed.  

## 2015-11-17 NOTE — Lactation Note (Signed)
This note was copied from a baby's chart. Lactation Consultation Note: Mother has infant in cross cradle hold sitting on the side of the bed. Mother states that due to pain she is unable to latch on the R breast. Recommend that mother ask her OB for a RX for APNO. Advised mother to continue to breastfeed every 2-3 hours and with all feeding cues.   Patient Name: [name] NWGNF'AToday's Date: 11/17/2015 Reason for consult: Follow-up assessment   Maternal Data    Feeding Feeding Type: Breast Fed Length of feed: 15 min (on and off)  LATCH Score/Interventions                      Lactation Tools Discussed/Used     Consult Status Consult Status: Follow-up Date: 11/18/15 Follow-up type: In-patient    Stevan BornKendrick, [name] 11/17/2015, 2:55 PM

## 2015-11-18 MED ORDER — PNEUMOCOCCAL VAC POLYVALENT 25 MCG/0.5ML IJ INJ
0.5000 mL | INJECTION | Freq: Once | INTRAMUSCULAR | Status: AC
  Administered 2015-11-18: 0.5 mL via INTRAMUSCULAR
  Filled 2015-11-18: qty 0.5

## 2015-11-18 MED ORDER — PNEUMOCOCCAL VAC POLYVALENT 25 MCG/0.5ML IJ INJ: 0.5000 mL | INJECTION | INTRAMUSCULAR | Status: DC

## 2015-11-18 MED ORDER — OXYCODONE-ACETAMINOPHEN 5-325 MG PO TABS: 1.0000 | ORAL_TABLET | Freq: Four times a day (QID) | ORAL | 0 refills | Status: DC | PRN

## 2015-11-18 MED ORDER — IBUPROFEN 600 MG PO TABS: 600.0000 mg | ORAL_TABLET | Freq: Four times a day (QID) | ORAL | 0 refills | Status: DC

## 2015-11-18 NOTE — Lactation Note (Signed)
This note was copied from a baby's chart. Lactation Consultation Note Mom frustrated d/t baby is cluster feeding. Encouraged mm to do STS, breast massaged. Mom feeling like baby isn't getting anough. Has colostrum, demonstrated hand expression. Discussed hearing swallow. Mom tired. Needed repositioned up in bed to BF. Heard ocassional swallows. demontrated ocassionaly swallows.,mom doesn't want to supplement whith teach back done.  Patient Name: [name] ZOXWR'UToday's Date: 11/18/2015 Reason for consult: Initial assessment;Breast/nipple pain;Difficult latch   Maternal Data    Feeding Feeding Type: Breast Fed Length of feed: 15 min  LATCH Score/Interventions Latch: Repeated attempts needed to sustain latch, nipple held in mouth throughout feeding, stimulation needed to elicit sucking reflex. Intervention(s): Adjust position;Assist with latch;Breast massage  Audible Swallowing: A few with stimulation Intervention(s): Skin to skin  Type of Nipple: Everted at rest and after stimulation  Comfort (Breast/Nipple): Soft / non-tender  Interventions (Mild/moderate discomfort): Hand massage;Hand expression  Hold (Positioning): Assistance needed to correctly position infant at breast and maintain latch. Intervention(s): Breastfeeding basics reviewed  LATCH Score: 7  Lactation Tools Discussed/Used Tools: Pump;Nipple Shields Breast pump type: Manual   Consult Status Consult Status: Follow-up Date: 11/18/15 Follow-up type: In-patient    Charyl DancerCARVER, Khan Chura G 11/18/2015, 3:41 AM

## 2015-11-18 NOTE — Lactation Note (Addendum)
This note was copied from a baby's chart. Lactation Consultation Note: Mother complains that her nipples are very sore. She states that infant cluster fed all night and was still not satisfied. Mother states that she doesn't want to give formula. Infant is in mothers bed sleeping. Mother was given comfort gels. Mother ask OB for RX for APNO.  Advised mother in care of breast when severely engorged. Mother to continue to breastfeed 8-12 times in 24 hours. Mother informed that cluster feeding is normal and that infant will cluster feed again when having growth spurts. Mother receptive to all teaching.   Patient Name: [name]'UToday's Date: 11/18/2015 Reason for consult: Follow-up assessment   Maternal Data    Feeding Feeding Type: Breast Fed Length of feed: 30 min  LATCH Score/Interventions                      Lactation Tools Discussed/Used     Consult Status Consult Status: Complete    Michel BickersKendrick, Jovon Streetman McCoy 11/18/2015, 9:03 AM

## 2015-11-18 NOTE — Discharge Instructions (Signed)

## 2015-11-18 NOTE — Discharge Summary (Signed)
OB Discharge Summary     Patient Name: [name] DOB: 01/30/1986 MRN: [id number]  Date of admission: 11/16/2015 Delivering MD: Wyvonnia DuskyLAWSON, MARIE D   Date of discharge: 11/18/2015  Admitting diagnosis: 40 WEEKS WATE4R BROKE Intrauterine pregnancy: 447w6d     Secondary diagnosis:  Active Problems:   Leakage of amniotic fluid   SVD (spontaneous vaginal delivery)  Additional problems: morbid obesity     Discharge diagnosis: Term Pregnancy Delivered                                                                                                Post partum procedures:none  Augmentation: Pitocin  Complications: None  Hospital course:  Onset of Labor With Vaginal Delivery     30 y.o. yo G3P3003 at 627w6d was admitted in Active Labor on 11/16/2015. Patient had an uncomplicated labor course as follows:  Membrane Rupture Time/Date: 1:40 AM ,11/16/2015   Intrapartum Procedures: Episiotomy: None [1]                                         Lacerations:  None [1]  Patient had a delivery of a Viable infant. 11/16/2015  Information for the patient's newborn:  [name], Boy Hannie [[id number]][[id number]]  Delivery Method: Vaginal, Spontaneous Delivery (Filed from Delivery Summary)    Pateint had an uncomplicated postpartum course.  She is ambulating, tolerating a regular diet, passing flatus, and urinating well. Patient is discharged home in stable condition on 11/18/15.    Physical exam  Vitals:   11/16/15 1640 11/17/15 0500 11/17/15 1756 11/18/15 0528  BP: (!) 122/57 117/75 131/74 (!) 109/51  Pulse: 74 71 92 73  Resp: 18 18 18 16   Temp: 99.2 F (37.3 C) 97.9 F (36.6 C) 97.8 F (36.6 C) 98 F (36.7 C)  TempSrc: Oral Oral Axillary Oral  SpO2:      Weight:      Height:       General: alert, cooperative and no distress Lochia: appropriate Uterine Fundus: firm DVT Evaluation: No evidence of DVT seen on physical exam. Negative Homan's sign. Labs: Lab Results  Component Value Date   WBC  6.1 11/16/2015   HGB 11.0 (L) 11/16/2015   HCT 32.5 (L) 11/16/2015   MCV 83.3 11/16/2015   PLT 270 11/16/2015   CMP Latest Ref Rng & Units 10/06/2015  Glucose 65 - 99 mg/dL 213(Y124(H)  BUN 6 - 20 mg/dL 10  Creatinine 8.650.44 - 7.841.00 mg/dL 6.960.77  Sodium 295135 - 284145 mmol/L 135  Potassium 3.5 - 5.1 mmol/L 3.7  Chloride 101 - 111 mmol/L 105  CO2 22 - 32 mmol/L 22  Calcium 8.9 - 10.3 mg/dL 9.2  Total Protein 6.5 - 8.1 g/dL 6.7  Total Bilirubin 0.3 - 1.2 mg/dL 0.8  Alkaline Phos 38 - 126 U/L 113  AST 15 - 41 U/L 23  ALT 14 - 54 U/L 39    Discharge instruction: per After Visit Summary and "Baby and Me Booklet".  After visit meds:  Medication List    TAKE these medications   albuterol 108 (90 Base) MCG/ACT inhaler Commonly known as:  PROVENTIL HFA;VENTOLIN HFA Inhale 1 puff into the lungs every 6 (six) hours as needed for wheezing or shortness of breath.   cetirizine 10 MG tablet Commonly known as:  ZYRTEC Take 10 mg by mouth daily.   cyclobenzaprine 10 MG tablet Commonly known as:  FLEXERIL Take 1 tablet (10 mg total) by mouth every 8 (eight) hours as needed for muscle spasms.   diphenhydrAMINE 25 MG tablet Commonly known as:  BENADRYL Take 25 mg by mouth at bedtime as needed for itching or allergies.   fluticasone 50 MCG/ACT nasal spray Commonly known as:  FLONASE Place 2 sprays into both nostrils daily.   ibuprofen 600 MG tablet Commonly known as:  ADVIL,MOTRIN Take 1 tablet (600 mg total) by mouth every 6 (six) hours.   oxyCODONE-acetaminophen 5-325 MG tablet Commonly known as:  PERCOCET/ROXICET Take 1-2 tablets by mouth every 6 (six) hours as needed for moderate pain.   Prenatal Vitamin 27-0.8 MG Tabs Take 1 tablet by mouth daily.   ranitidine 75 MG tablet Commonly known as:  ZANTAC Take 75 mg by mouth daily.       Diet: routine diet  Activity: Advance as tolerated. Pelvic rest for 6 weeks.   Outpatient follow up:6 weeks Follow up Appt:No future  appointments. Follow up Visit: Follow-up Information    Center for [facility]. Schedule an appointment as soon as possible for a visit in 6 week(s).   Specialty:  Obstetrics and Gynecology Why:  postpartum Contact information: [address] [place] [address] [phone]          Postpartum contraception: IUD Mirena  Newborn Data: Live born female  Birth Weight: 9 lb 2 oz (4140 g) APGAR: 8, 9  Baby Feeding: Breast Disposition:home with mother   11/18/2015 Leland Her, DO PGY-1  Midwife attestation Post Partum Day 2 I have seen and examined this patient and agree with above documentation in the resident's note.   [name] is a 30 y.o. G3P3003 s/p NSVD.  Pt denies problems with ambulating, voiding or po intake. Pain is well controlled.  Plan for birth control is IUD.  Method of Feeding: breast  PE:  BP (!) 109/51 (BP Location: Left Arm)   Pulse 73   Temp 98 F (36.7 C) (Oral)   Resp 16   Ht 5' 3.5" (1.613 m)   Wt 247 lb (112 kg)   LMP 02/03/2015   SpO2 99%   Breastfeeding? Unknown   BMI 43.07 kg/m  Gen: well appearing Heart: reg rate Lungs: normal WOB Fundus firm Ext: soft, no pain, no edema  Plan for discharge: Today  [name], CNM 8:58 AM

## 2015-11-20 ENCOUNTER — Inpatient Hospital Stay (HOSPITAL_COMMUNITY)
Admission: AD | Admit: 2015-11-20 | Discharge: 2015-11-20 | Disposition: A | Discharge status: Home / Self Care | Payer: Medicaid Other | Source: Ambulatory Visit | Attending: Obstetrics and Gynecology | Admitting: Obstetrics and Gynecology

## 2015-11-20 ENCOUNTER — Encounter (HOSPITAL_COMMUNITY): Payer: Self-pay | Admitting: *Deleted

## 2015-11-20 ENCOUNTER — Telehealth: Payer: Self-pay | Admitting: *Deleted

## 2015-11-20 DIAGNOSIS — Z87891 Personal history of nicotine dependence: Secondary | ICD-10-CM | POA: Insufficient documentation

## 2015-11-20 DIAGNOSIS — O9089 Other complications of the puerperium, not elsewhere classified: Secondary | ICD-10-CM | POA: Diagnosis not present

## 2015-11-20 DIAGNOSIS — R609 Edema, unspecified: Secondary | ICD-10-CM | POA: Insufficient documentation

## 2015-11-20 DIAGNOSIS — M7989 Other specified soft tissue disorders: Secondary | ICD-10-CM | POA: Diagnosis present

## 2015-11-20 DIAGNOSIS — J45909 Unspecified asthma, uncomplicated: Secondary | ICD-10-CM | POA: Insufficient documentation

## 2015-11-20 DIAGNOSIS — R51 Headache: Secondary | ICD-10-CM | POA: Diagnosis present

## 2015-11-20 DIAGNOSIS — R109 Unspecified abdominal pain: Secondary | ICD-10-CM | POA: Insufficient documentation

## 2015-11-20 DIAGNOSIS — R509 Fever, unspecified: Secondary | ICD-10-CM | POA: Diagnosis not present

## 2015-11-20 DIAGNOSIS — O9952 Diseases of the respiratory system complicating childbirth: Secondary | ICD-10-CM | POA: Diagnosis not present

## 2015-11-20 LAB — PROTEIN / CREATININE RATIO, URINE
Creatinine, Urine: 110 mg/dL
PROTEIN CREATININE RATIO: 0.66 mg/mg{creat} — AB (ref 0.00–0.15)
Total Protein, Urine: 73 mg/dL

## 2015-11-20 LAB — URINALYSIS, ROUTINE W REFLEX MICROSCOPIC
Bilirubin Urine: NEGATIVE
Glucose, UA: NEGATIVE mg/dL
Ketones, ur: NEGATIVE mg/dL
Nitrite: NEGATIVE
Protein, ur: 30 mg/dL — AB
Specific Gravity, Urine: 1.015 (ref 1.005–1.030)
pH: 6.5 (ref 5.0–8.0)

## 2015-11-20 LAB — CBC
HCT: 29.2 % — ABNORMAL LOW (ref 36.0–46.0)
Hemoglobin: 9.7 g/dL — ABNORMAL LOW (ref 12.0–15.0)
MCH: 27.9 pg (ref 26.0–34.0)
MCHC: 33.2 g/dL (ref 30.0–36.0)
MCV: 83.9 fL (ref 78.0–100.0)
Platelets: 271 10*3/uL (ref 150–400)
RBC: 3.48 MIL/uL — ABNORMAL LOW (ref 3.87–5.11)
RDW: 14.6 % (ref 11.5–15.5)
WBC: 9.9 10*3/uL (ref 4.0–10.5)

## 2015-11-20 LAB — COMPREHENSIVE METABOLIC PANEL
ALT: 34 U/L (ref 14–54)
AST: 22 U/L (ref 15–41)
Albumin: 2.6 g/dL — ABNORMAL LOW (ref 3.5–5.0)
Alkaline Phosphatase: 98 U/L (ref 38–126)
Anion gap: 8 (ref 5–15)
BUN: 13 mg/dL (ref 6–20)
CO2: 24 mmol/L (ref 22–32)
Calcium: 9.2 mg/dL (ref 8.9–10.3)
Chloride: 106 mmol/L (ref 101–111)
Creatinine, Ser: 0.97 mg/dL (ref 0.44–1.00)
GFR calc Af Amer: >60 mL/min (ref >60)
GFR calc non Af Amer: >60 mL/min (ref >60)
Glucose, Bld: 87 mg/dL (ref 65–99)
Potassium: 3.8 mmol/L (ref 3.5–5.1)
Sodium: 138 mmol/L (ref 135–145)
Total Bilirubin: 0.9 mg/dL (ref 0.3–1.2)
Total Protein: 6.8 g/dL (ref 6.5–8.1)

## 2015-11-20 LAB — URINE MICROSCOPIC-ADD ON

## 2015-11-20 MED ORDER — IBUPROFEN 600 MG PO TABS
600.0000 mg | ORAL_TABLET | Freq: Four times a day (QID) | ORAL | Status: DC | PRN
  Administered 2015-11-20: 600 mg via ORAL
  Filled 2015-11-20: qty 1

## 2015-11-20 NOTE — MAU Provider Note (Signed)
History     CSN: [id number]  Arrival date and time: 11/20/15 7829   First Provider Initiated Contact with Patient 11/20/15 1019      Chief Complaint  Patient presents with  . Fever  . Leg Swelling  . Headache   G3P3003 postpartum female s/p SVD 4 days ago c/o HA, fevers, and swelling. She reports frontal HA that started yesterday. She took Ibuprofen and had relief. She also c/o fever since last night. Temp max was 101.6 and came down after Ibuprofen. She also reports swelling of LE since delivery, no worsening or improvement. She denies visual disturbances and epigastric pain. She denies dysuria, urgency, and frequency. She reports minimal lochia and no clots or odor. Cramping is minimal. She is breastpumping q3-4 hours and milk came in about 36 hrs ago. She denies redness, warmth, and pain of the breasts although she does endorse nipple pain for which she is using APNO.     OB History    Gravida Para Term Preterm AB Living   3 3 3  0 0 3   SAB TAB Ectopic Multiple Live Births   0 0 0 0 3      Past Medical History:  Diagnosis Date  . Asthma   . Headache(784.0)   . Pyelonephritis affecting pregnancy in third trimester, antepartum 08/29/2015  . Seasonal allergies     Past Surgical History:  Procedure Laterality Date  . CESAREAN SECTION    . TONSILLECTOMY    . WISDOM TOOTH EXTRACTION      Family History  Problem Relation Age of Onset  . Adopted: Yes  . Family history unknown: Yes    Social History  Substance Use Topics  . Smoking status: Former Smoker    Packs/day: 0.30    Years: 5.00    Types: Cigarettes    Quit date: 04/10/2015  . Smokeless tobacco: Never Used  . Alcohol use No    Allergies:  Allergies  Allergen Reactions  . Pepto-Bismol [Bismuth] Swelling and Other (See Comments)    Reaction:  Mouth/tongue swelling   . Sage [Salvia Officinalis] Hives, Swelling and Other (See Comments)    Reaction:  Mouth/tongue swelling    Prescriptions Prior to  Admission  Medication Sig Dispense Refill Last Dose  . diphenhydrAMINE (BENADRYL) 25 MG tablet Take 25 mg by mouth at bedtime as needed for itching or allergies.    Past Week at Unknown time  . fluticasone (FLONASE) 50 MCG/ACT nasal spray Place 2 sprays into both nostrils daily.    11/19/2015 at Unknown time  . ibuprofen (ADVIL,MOTRIN) 600 MG tablet Take 1 tablet (600 mg total) by mouth every 6 (six) hours. 30 tablet 0 11/20/2015 at Unknown time  . oxyCODONE-acetaminophen (PERCOCET/ROXICET) 5-325 MG tablet Take 1-2 tablets by mouth every 6 (six) hours as needed for moderate pain. 10 tablet 0 11/19/2015 at Unknown time  . Prenatal Vit-Fe Fumarate-FA (PRENATAL VITAMIN) 27-0.8 MG TABS Take 1 tablet by mouth daily. 30 tablet 12 11/20/2015 at Unknown time  . albuterol (PROVENTIL HFA;VENTOLIN HFA) 108 (90 Base) MCG/ACT inhaler Inhale 1 puff into the lungs every 6 (six) hours as needed for wheezing or shortness of breath.   prn  . cyclobenzaprine (FLEXERIL) 10 MG tablet Take 1 tablet (10 mg total) by mouth every 8 (eight) hours as needed for muscle spasms. (Patient not taking: Reported on 11/20/2015) 30 tablet 2 Not Taking at Unknown time    Review of Systems  Constitutional: Positive for fever. Negative for chills and weight  loss.  Eyes: Negative.   Cardiovascular: Positive for leg swelling.  Gastrointestinal: Negative.   Genitourinary: Negative.   Neurological: Positive for headaches.   Physical Exam   Blood pressure 118/87, pulse 92, temperature 97.7 F (36.5 C), temperature source Oral, resp. rate 18, unknown if currently breastfeeding.  Physical Exam  Constitutional: She is oriented to person, place, and time. She appears well-developed and well-nourished.  HENT:  Head: Normocephalic and atraumatic.  Neck: Normal range of motion. Neck supple.  Cardiovascular: Normal rate.   Respiratory: Effort normal. Right breast exhibits no inverted nipple, no mass (firm throughout), no nipple discharge, no  skin change (no erythema) and no tenderness. Left breast exhibits no inverted nipple, no mass (firm throughout), no nipple discharge, no skin change (no erythema) and no tenderness.  GI: Soft. She exhibits no distension and no mass. There is tenderness (mild, lower quadrants). There is no rebound and no guarding.  Genitourinary:  Genitourinary Comments: External: no lesions Vagina: rugated, parous, moderate bloody discharge, 3-4 small dark clots coming from os, removed with ring forceps Uterus: enlarged with normal involution, anteverted, moderately tender, +CMT    Musculoskeletal: Normal range of motion. She exhibits edema (3+ pitting).  Neurological: She is alert and oriented to person, place, and time.  Skin: Skin is warm and dry.  Psychiatric: She has a normal mood and affect.   Results for orders placed or performed during the hospital encounter of 11/20/15 (from the past 24 hour(s))  Protein / creatinine ratio, urine     Status: Abnormal   Collection Time: 11/20/15  9:35 AM  Result Value Ref Range   Creatinine, Urine 110.00 mg/dL   Total Protein, Urine 73 mg/dL   Protein Creatinine Ratio 0.66 (H) 0.00 - 0.15 mg/mg[Cre]  Urinalysis, Routine w reflex microscopic (not at [facility])     Status: Abnormal   Collection Time: 11/20/15  9:38 AM  Result Value Ref Range   Color, Urine YELLOW YELLOW   APPearance HAZY (A) CLEAR   Specific Gravity, Urine 1.015 1.005 - 1.030   pH 6.5 5.0 - 8.0   Glucose, UA NEGATIVE NEGATIVE mg/dL   Hgb urine dipstick LARGE (A) NEGATIVE   Bilirubin Urine NEGATIVE NEGATIVE   Ketones, ur NEGATIVE NEGATIVE mg/dL   Protein, ur 30 (A) NEGATIVE mg/dL   Nitrite NEGATIVE NEGATIVE   Leukocytes, UA LARGE (A) NEGATIVE  Urine microscopic-add on     Status: Abnormal   Collection Time: 11/20/15  9:38 AM  Result Value Ref Range   Squamous Epithelial / LPF 0-5 (A) NONE SEEN   WBC, UA TOO NUMEROUS TO COUNT 0 - 5 WBC/hpf   RBC / HPF 0-5 0 - 5 RBC/hpf   Bacteria, UA RARE (A)  NONE SEEN  CBC     Status: Abnormal   Collection Time: 11/20/15 11:25 AM  Result Value Ref Range   WBC 9.9 4.0 - 10.5 K/uL   RBC 3.48 (L) 3.87 - 5.11 MIL/uL   Hemoglobin 9.7 (L) 12.0 - 15.0 g/dL   HCT 02.729.2 (L) 25.336.0 - 66.446.0 %   MCV 83.9 78.0 - 100.0 fL   MCH 27.9 26.0 - 34.0 pg   MCHC 33.2 30.0 - 36.0 g/dL   RDW 40.314.6 47.411.5 - 25.915.5 %   Platelets 271 150 - 400 K/uL  Comprehensive metabolic panel     Status: Abnormal   Collection Time: 11/20/15 11:25 AM  Result Value Ref Range   Sodium 138 135 - 145 mmol/L   Potassium 3.8 3.5 -  5.1 mmol/L   Chloride 106 101 - 111 mmol/L   CO2 24 22 - 32 mmol/L   Glucose, Bld 87 65 - 99 mg/dL   BUN 13 6 - 20 mg/dL   Creatinine, Ser 1.61 0.44 - 1.00 mg/dL   Calcium 9.2 8.9 - 09.6 mg/dL   Total Protein 6.8 6.5 - 8.1 g/dL   Albumin 2.6 (L) 3.5 - 5.0 g/dL   AST 22 15 - 41 U/L   ALT 34 14 - 54 U/L   Alkaline Phosphatase 98 38 - 126 U/L   Total Bilirubin 0.9 0.3 - 1.2 mg/dL   GFR calc non Af Amer >60 >60 mL/min   GFR calc Af Amer >60 >60 mL/min   Anion gap 8 5 - 15    MAU Course  Procedures Ibuprofen 600 mg po x1-reported relief of HA. MDM Labs ordered and reviewed. No evidence of pre-e, normotensive although p/c ratio elevated may be d/t contaminated urine with lochia. No evidence of UTI or Mastitis. Low grade fever may be d/t lactation transition. Consulted with Dr. Alysia Penna. Plan for BP check in [facility] tomorrow. Stable for discharge home.   Assessment and Plan   1. Postpartum headache   2. Fever, unknown origin   3. Dependent edema    Discharge home Increase water intake Continue regular pumping, massage prior to Follow up in [facility] tomorrow for BP check Pre-e precautions  Donette Larry, CNM 11/20/2015, 10:58 AM

## 2015-11-20 NOTE — Telephone Encounter (Signed)
Received call from Darl PikesSusan regarding pt's maternity STD request which has been submitted. She is asking for information to support the claim and states that it is time sensitive.

## 2015-11-20 NOTE — Discharge Instructions (Signed)

## 2015-11-20 NOTE — MAU Provider Note (Signed)
History     CSN: [id number]  Arrival date and time: 11/20/15 1191   First Provider Initiated Contact with Patient 11/20/15 1019     Chief Complaint  Patient presents with  . Fever  . Leg Swelling  . Headache   HPI  [name] is a 30yo G3P3003 4 days s/p VBAC presenting today with fever, bilateral leg swelling and headache.  Pt reports that the fever started on 8/29 when she was discharged from the hospital.  The highest temperature was 101.6 F around midnight last night.  Pt also reports chills that occur with the fevers.  She has taken Motrin which helps bring the fever down.  Pt was not febrile when temperature taken this morning. She is breastfeeding and pumps about every 4 hours.  Admits fullness and warmth in both breasts but denies sharp pain.  Also reports some abdominal cramping and minimal vaginal bleeding.  Denies passage of clots, foul smelling discharge, burning with urination, N/V/D/C.   The leg swelling has been present since the pt was in the hospital. Pt reports receiving IV fluids during hospital course. She reports bilateral swelling up to her knees and tightness of the skin in her feet and legs.  Denies calf pain.   The headache has been present since yesterday and is described as constant.  The pain is located across her forehead and behind her eyes.  Some relief of pain reported with Motrin.  Pt reports a history of migraines but these headaches are different.  She has been drinking fluids and eating well. Denies visual sx.  Pt denies SOB, chest pain and upper abdominal pain.    OB History    Gravida Para Term Preterm AB Living   3 3 3  0 0 3   SAB TAB Ectopic Multiple Live Births   0 0 0 0 3      Past Medical History:  Diagnosis Date  . Asthma   . Headache(784.0)   . Pyelonephritis affecting pregnancy in third trimester, antepartum 08/29/2015  . Seasonal allergies     Past Surgical History:  Procedure Laterality Date  . CESAREAN SECTION    .  TONSILLECTOMY    . WISDOM TOOTH EXTRACTION      Family History  Problem Relation Age of Onset  . Adopted: Yes  . Family history unknown: Yes    Social History  Substance Use Topics  . Smoking status: Former Smoker    Packs/day: 0.30    Years: 5.00    Types: Cigarettes    Quit date: 04/10/2015  . Smokeless tobacco: Never Used  . Alcohol use No    Allergies:  Allergies  Allergen Reactions  . Pepto-Bismol [Bismuth] Swelling and Other (See Comments)    Reaction:  Mouth/tongue swelling   . Sage [Salvia Officinalis] Hives, Swelling and Other (See Comments)    Reaction:  Mouth/tongue swelling    Prescriptions Prior to Admission  Medication Sig Dispense Refill Last Dose  . diphenhydrAMINE (BENADRYL) 25 MG tablet Take 25 mg by mouth at bedtime as needed for itching or allergies.    Past Week at Unknown time  . fluticasone (FLONASE) 50 MCG/ACT nasal spray Place 2 sprays into both nostrils daily.    11/19/2015 at Unknown time  . ibuprofen (ADVIL,MOTRIN) 600 MG tablet Take 1 tablet (600 mg total) by mouth every 6 (six) hours. 30 tablet 0 11/20/2015 at Unknown time  . oxyCODONE-acetaminophen (PERCOCET/ROXICET) 5-325 MG tablet Take 1-2 tablets by mouth every 6 (six) hours  as needed for moderate pain. 10 tablet 0 11/19/2015 at Unknown time  . Prenatal Vit-Fe Fumarate-FA (PRENATAL VITAMIN) 27-0.8 MG TABS Take 1 tablet by mouth daily. 30 tablet 12 11/20/2015 at Unknown time  . albuterol (PROVENTIL HFA;VENTOLIN HFA) 108 (90 Base) MCG/ACT inhaler Inhale 1 puff into the lungs every 6 (six) hours as needed for wheezing or shortness of breath.   prn  . cyclobenzaprine (FLEXERIL) 10 MG tablet Take 1 tablet (10 mg total) by mouth every 8 (eight) hours as needed for muscle spasms. (Patient not taking: Reported on 11/20/2015) 30 tablet 2 Not Taking at Unknown time    Review of Systems  Constitutional: Positive for chills and fever.  Eyes: Negative for blurred vision, double vision and photophobia.   Respiratory: Negative for cough and shortness of breath.   Cardiovascular: Negative for chest pain.  Gastrointestinal: Positive for abdominal pain. Negative for constipation, diarrhea, nausea and vomiting.       Pt reports cramping pain in lower abdomen  Genitourinary: Negative for dysuria.  Neurological: Positive for headaches.   Physical Exam   Blood pressure 118/87, pulse 92, temperature 97.7 F (36.5 C), temperature source Oral, resp. rate 18, unknown if currently breastfeeding.  Physical Exam  Nursing note and vitals reviewed. Constitutional: She is oriented to person, place, and time. She appears well-developed and well-nourished.  Cardiovascular: Normal rate and regular rhythm.   Respiratory: Effort normal and breath sounds normal. No respiratory distress. She has no wheezes.  GI: Soft. Bowel sounds are normal. She exhibits no distension. There is tenderness. There is no guarding.  Genitourinary: No breast swelling or discharge. There is no rash or lesion on the right labia. There is no lesion on the left labia. Uterus is tender. There is tenderness and bleeding in the vagina.  Genitourinary Comments: Breasts warm, temp equal bilaterally.  No unilateral swelling or erythema.  No discharge Significant tenderness with palpation of uterine fundus on bimanual exam. Homero FellersFrank blood present. Several quarter size clots removed on speculum exam  Musculoskeletal: She exhibits edema.  Bilateral 3+ pitting edema to the level of tibia  Neurological: She is alert and oriented to person, place, and time.  Skin: Skin is warm and dry. No rash noted. She is not diaphoretic. No erythema.  Psychiatric: She has a normal mood and affect. Her behavior is normal.   Results for orders placed or performed during the hospital encounter of 11/20/15 (from the past 24 hour(s))  Urinalysis, Routine w reflex microscopic (not at [facility])     Status: Abnormal   Collection Time: 11/20/15  9:38 AM  Result Value Ref  Range   Color, Urine YELLOW YELLOW   APPearance HAZY (A) CLEAR   Specific Gravity, Urine 1.015 1.005 - 1.030   pH 6.5 5.0 - 8.0   Glucose, UA NEGATIVE NEGATIVE mg/dL   Hgb urine dipstick LARGE (A) NEGATIVE   Bilirubin Urine NEGATIVE NEGATIVE   Ketones, ur NEGATIVE NEGATIVE mg/dL   Protein, ur 30 (A) NEGATIVE mg/dL   Nitrite NEGATIVE NEGATIVE   Leukocytes, UA LARGE (A) NEGATIVE  Urine microscopic-add on     Status: Abnormal   Collection Time: 11/20/15  9:38 AM  Result Value Ref Range   Squamous Epithelial / LPF 0-5 (A) NONE SEEN   WBC, UA TOO NUMEROUS TO COUNT 0 - 5 WBC/hpf   RBC / HPF 0-5 0 - 5 RBC/hpf   Bacteria, UA RARE (A) NONE SEEN  CBC     Status: Abnormal   Collection Time:  11/20/15 11:25 AM  Result Value Ref Range   WBC 9.9 4.0 - 10.5 K/uL   RBC 3.48 (L) 3.87 - 5.11 MIL/uL   Hemoglobin 9.7 (L) 12.0 - 15.0 g/dL   HCT 40.9 (L) 81.1 - 91.4 %   MCV 83.9 78.0 - 100.0 fL   MCH 27.9 26.0 - 34.0 pg   MCHC 33.2 30.0 - 36.0 g/dL   RDW 78.2 95.6 - 21.3 %   Platelets 271 150 - 400 K/uL    MAU Course  Procedures Ibuprofen 600 mg po x1-reported relief of Ha.  MDM CBC, Ua, urine culture, CMP, and protein/creatinine ratio were ordered to rule out preeclampsia.  Protein/creatinine ratio elevated at .66.  No hypertensive values yet but pt has had BP readings that are elevated for her.  Doesn't fully meet pre-e criteria.  Pt has had UTIs in the past but does not feel similar sx today.  Assessment and Plan  1. Postpartum Headache  -Discharge home.  Have [facility] to [facility] recheck tomorrow am as preeclampsia precaution  2. Fever, unknown origin  -Continue with motrin for fever control  -pt will adequately hydrate  3. Dependent edema  -ambulation encouraged  -compression stockings should pt desire  Pt will continue regular pumping of breast milk.   Pt informed to return if sx worsen including headache refractory to meds, epigastric pain or visual symptoms  develop.  Erby Sanderson 11/20/2015, 10:57 AM

## 2015-11-20 NOTE — MAU Note (Signed)
Pt delivered on Sunday, has severe swelling in feet, has a HA, has intermittent fever - since she went home, has been 100.2, 101.6 last night, pt took percocet.  Vaginal delivery.  Has light bleeding.

## 2015-11-20 NOTE — MAU Provider Note (Signed)
OB Attending Pt presented to MAU with c/o Ha. 4 days postpartum. See CNM note for additional information. Labs no evidence of HELLP and BP's normal. HA resolved after Motrin. Will d/c home with precautions and return to clinic tomorrow for BP check

## 2015-11-21 ENCOUNTER — Encounter: Payer: Self-pay | Admitting: *Deleted

## 2015-11-21 ENCOUNTER — Ambulatory Visit: Payer: Medicaid Other | Admitting: *Deleted

## 2015-11-21 VITALS — BP 130/90 | HR 95 | Temp 98.9°F | Wt 252.7 lb

## 2015-11-21 DIAGNOSIS — Z0130 Encounter for examination of blood pressure without abnormal findings: Secondary | ICD-10-CM

## 2015-11-21 NOTE — Telephone Encounter (Signed)
Patient was seen in our office today.  

## 2015-11-21 NOTE — Progress Notes (Signed)
Patient presents to clinic for BP check. Was in MAU yesterday d/t complaints of headache and leg swelling. Did not meet criteria for pre-e at that time. Today bp 130/90, swelling to feet and legs 3+, c/o headache 8/10. No visual disturbances or abd pain. Stated ran 102 fever last night and seems to be running fever at night the last 2-3 days. Afebrile at this time. Spoke with Dr Adrian BlackwaterStinson who reviewed her chart. He stated that she could go home, treat fever with ibuprofen and tylenol as needed. If belly pain or bleeding increases she needs to come back to be seen.  Relayed this info to patient who voiced understanding.

## 2015-11-22 LAB — URINE CULTURE

## 2015-11-23 ENCOUNTER — Telehealth: Payer: Self-pay | Admitting: Nurse Practitioner

## 2015-11-23 NOTE — Telephone Encounter (Signed)
Reviewed urine culture.  50,000 Proteus colonies.  Reviewed recent discharge from delivery on 11-18-15 and notes since then.  Aware of hospitalization in June for pyelonephritis.  Consult with Dr. Erin FullingHarraway-Smith re:  plan of care. Called client - no fever yesterday or today.  Having some lower abdominal pain (which could be normal as uterus is returning to normal size - client is breastfeeding).  Denies any dysuria or back pain.  Encouraged continued fluid intake for good health and to call if she has worsening symptoms.   Since she does not have fever, dysuria or back pain, will not treat for UTI at this time.  Caitlin Bernheimerri Jerric Oyen, NP

## 2015-11-26 ENCOUNTER — Telehealth: Payer: Self-pay | Admitting: General Practice

## 2015-11-26 DIAGNOSIS — N390 Urinary tract infection, site not specified: Secondary | ICD-10-CM

## 2015-11-26 MED ORDER — CEPHALEXIN 500 MG PO CAPS: 500.0000 mg | ORAL_CAPSULE | Freq: Three times a day (TID) | ORAL | 0 refills | Status: DC

## 2015-11-26 NOTE — Telephone Encounter (Signed)
Per Dr Alysia PennaErvin, patient has UTI & needs keflex 500mg  TID x 7 days. Called patient & informed her of UTI & antibiotic sent to pharmacy. Patient verbalized understanding & states that's why she hasn't been feeling well lately. Patient had no questions

## 2015-11-29 ENCOUNTER — Inpatient Hospital Stay (HOSPITAL_COMMUNITY)
Admission: AD | Admit: 2015-11-29 | Discharge: 2015-11-29 | Disposition: A | Discharge status: Home / Self Care | Payer: Medicaid Other | Source: Ambulatory Visit | Attending: Obstetrics and Gynecology | Admitting: Obstetrics and Gynecology

## 2015-11-29 ENCOUNTER — Encounter (HOSPITAL_COMMUNITY): Payer: Self-pay | Admitting: *Deleted

## 2015-11-29 ENCOUNTER — Telehealth: Payer: Self-pay | Admitting: Obstetrics and Gynecology

## 2015-11-29 DIAGNOSIS — B964 Proteus (mirabilis) (morganii) as the cause of diseases classified elsewhere: Secondary | ICD-10-CM | POA: Diagnosis not present

## 2015-11-29 DIAGNOSIS — O1205 Gestational edema, complicating the puerperium: Secondary | ICD-10-CM | POA: Diagnosis not present

## 2015-11-29 DIAGNOSIS — R300 Dysuria: Secondary | ICD-10-CM | POA: Diagnosis not present

## 2015-11-29 DIAGNOSIS — Z91018 Allergy to other foods: Secondary | ICD-10-CM | POA: Insufficient documentation

## 2015-11-29 DIAGNOSIS — R51 Headache: Secondary | ICD-10-CM | POA: Diagnosis not present

## 2015-11-29 DIAGNOSIS — Z87891 Personal history of nicotine dependence: Secondary | ICD-10-CM | POA: Insufficient documentation

## 2015-11-29 DIAGNOSIS — R7989 Other specified abnormal findings of blood chemistry: Secondary | ICD-10-CM

## 2015-11-29 DIAGNOSIS — G44209 Tension-type headache, unspecified, not intractable: Secondary | ICD-10-CM

## 2015-11-29 DIAGNOSIS — N6459 Other signs and symptoms in breast: Secondary | ICD-10-CM | POA: Diagnosis not present

## 2015-11-29 DIAGNOSIS — N390 Urinary tract infection, site not specified: Secondary | ICD-10-CM | POA: Diagnosis not present

## 2015-11-29 DIAGNOSIS — O165 Unspecified maternal hypertension, complicating the puerperium: Secondary | ICD-10-CM | POA: Insufficient documentation

## 2015-11-29 DIAGNOSIS — Z888 Allergy status to other drugs, medicaments and biological substances status: Secondary | ICD-10-CM | POA: Diagnosis not present

## 2015-11-29 LAB — URINALYSIS, ROUTINE W REFLEX MICROSCOPIC
BILIRUBIN URINE: NEGATIVE
Glucose, UA: NEGATIVE mg/dL
Ketones, ur: NEGATIVE mg/dL
NITRITE: NEGATIVE
Protein, ur: 100 mg/dL — AB
SPECIFIC GRAVITY, URINE: 1.02 (ref 1.005–1.030)
pH: 6.5 (ref 5.0–8.0)

## 2015-11-29 LAB — CBC WITH DIFFERENTIAL/PLATELET
BASOS PCT: 0 %
Basophils Absolute: 0 10*3/uL (ref 0.0–0.1)
EOS ABS: 0.1 10*3/uL (ref 0.0–0.7)
Eosinophils Relative: 1 %
HCT: 26 % — ABNORMAL LOW (ref 36.0–46.0)
HEMOGLOBIN: 8.6 g/dL — AB (ref 12.0–15.0)
LYMPHS ABS: 1.4 10*3/uL (ref 0.7–4.0)
Lymphocytes Relative: 15 %
MCH: 27 pg (ref 26.0–34.0)
MCHC: 33.1 g/dL (ref 30.0–36.0)
MCV: 81.5 fL (ref 78.0–100.0)
Monocytes Absolute: 0.4 10*3/uL (ref 0.1–1.0)
Monocytes Relative: 4 %
NEUTROS PCT: 80 %
Neutro Abs: 7.4 10*3/uL (ref 1.7–7.7)
Platelets: 387 10*3/uL (ref 150–400)
RBC: 3.19 MIL/uL — AB (ref 3.87–5.11)
RDW: 14 % (ref 11.5–15.5)
WBC: 9.3 10*3/uL (ref 4.0–10.5)

## 2015-11-29 LAB — COMPREHENSIVE METABOLIC PANEL
ALBUMIN: 2.4 g/dL — AB (ref 3.5–5.0)
ALK PHOS: 86 U/L (ref 38–126)
ALT: 19 U/L (ref 14–54)
AST: 16 U/L (ref 15–41)
Anion gap: 8 (ref 5–15)
BUN: 15 mg/dL (ref 6–20)
CALCIUM: 8.8 mg/dL — AB (ref 8.9–10.3)
CO2: 23 mmol/L (ref 22–32)
CREATININE: 1.3 mg/dL — AB (ref 0.44–1.00)
Chloride: 106 mmol/L (ref 101–111)
GFR calc Af Amer: >60 mL/min (ref >60)
GFR calc non Af Amer: 54 mL/min — ABNORMAL LOW (ref >60)
GLUCOSE: 92 mg/dL (ref 65–99)
Potassium: 3.3 mmol/L — ABNORMAL LOW (ref 3.5–5.1)
SODIUM: 137 mmol/L (ref 135–145)
Total Bilirubin: 0.4 mg/dL (ref 0.3–1.2)
Total Protein: 6.5 g/dL (ref 6.5–8.1)

## 2015-11-29 LAB — URINE MICROSCOPIC-ADD ON

## 2015-11-29 LAB — PROTEIN / CREATININE RATIO, URINE
Creatinine, Urine: 57 mg/dL
Protein Creatinine Ratio: 3.11 mg/mg{Cre} — ABNORMAL HIGH (ref 0.00–0.15)
Total Protein, Urine: 177 mg/dL

## 2015-11-29 MED ORDER — HYDROCHLOROTHIAZIDE 25 MG PO TABS
25.0000 mg | ORAL_TABLET | Freq: Once | ORAL | Status: AC
  Administered 2015-11-29: 25 mg via ORAL
  Filled 2015-11-29: qty 1

## 2015-11-29 MED ORDER — BUTALBITAL-APAP-CAFFEINE 50-325-40 MG PO CAPS: 1.0000 | ORAL_CAPSULE | Freq: Four times a day (QID) | ORAL | 0 refills | Status: DC | PRN

## 2015-11-29 MED ORDER — BUTALBITAL-APAP-CAFFEINE 50-325-40 MG PO TABS
2.0000 | ORAL_TABLET | Freq: Once | ORAL | Status: AC
  Administered 2015-11-29: 2 via ORAL
  Filled 2015-11-29: qty 2

## 2015-11-29 MED ORDER — HYDROCHLOROTHIAZIDE 25 MG PO TABS
25.0000 mg | ORAL_TABLET | Freq: Every day | ORAL | 0 refills | Status: DC
Start: 2015-11-29 — End: 2016-02-10

## 2015-11-29 NOTE — Discharge Instructions (Signed)
General Headache Without Cause °A headache is pain or discomfort felt around the head or neck area. The specific cause of a headache may not be found. There are many causes and types of headaches. A few common ones are: °· Tension headaches. °· Migraine headaches. °· Cluster headaches. °· Chronic daily headaches. °HOME CARE INSTRUCTIONS  °Watch your condition for any changes. Take these steps to help with your condition: °Managing Pain °· Take over-the-counter and prescription medicines only as told by your health care provider. °· Lie down in a dark, quiet room when you have a headache. °· If directed, apply ice to the head and neck area: °· Put ice in a plastic bag. °· Place a towel between your skin and the bag. °· Leave the ice on for 20 minutes, 2-3 times per day. °· Use a heating pad or hot shower to apply heat to the head and neck area as told by your health care provider. °· Keep lights dim if bright lights bother you or make your headaches worse. °Eating and Drinking °· Eat meals on a regular schedule. °· Limit alcohol use. °· Decrease the amount of caffeine you drink, or stop drinking caffeine. °General Instructions °· Keep all follow-up visits as told by your health care provider. This is important. °· Keep a headache journal to help find out what may trigger your headaches. For example, write down: °· What you eat and drink. °· How much sleep you get. °· Any change to your diet or medicines. °· Try massage or other relaxation techniques. °· Limit stress. °· Sit up straight, and do not tense your muscles. °· Do not use tobacco products, including cigarettes, chewing tobacco, or e-cigarettes. If you need help quitting, ask your health care provider. °· Exercise regularly as told by your health care provider. °· Sleep on a regular schedule. Get 7-9 hours of sleep, or the amount recommended by your health care provider. °SEEK MEDICAL CARE IF:  °· Your symptoms are not helped by medicine. °· You have a  headache that is different from the usual headache. °· You have nausea or you vomit. °· You have a fever. °SEEK IMMEDIATE MEDICAL CARE IF:  °· Your headache becomes severe. °· You have repeated vomiting. °· You have a stiff neck. °· You have a loss of vision. °· You have problems with speech. °· You have pain in the eye or ear. °· You have muscular weakness or loss of muscle control. °· You lose your balance or have trouble walking. °· You feel faint or pass out. °· You have confusion. °  °This information is not intended to replace advice given to you by your health care provider. Make sure you discuss any questions you have with your health care provider. °  °Document Released: 03/08/2005 Document Revised: 11/27/2014 Document Reviewed: 07/01/2014 °Elsevier Interactive Patient Education ©2016 Elsevier Inc. ° °Edema °Edema is an abnormal buildup of fluids in your body tissues. Edema is somewhat dependent on gravity to pull the fluid to the lowest place in your body. That makes the condition more common in the legs and thighs (lower extremities). Painless swelling of the feet and ankles is common and becomes more likely as you get older. It is also common in looser tissues, like around your eyes.  °When the affected area is squeezed, the fluid may move out of that spot and leave a dent for a few moments. This dent is called pitting.  °CAUSES  °There are many possible causes of edema. Eating   too much salt and being on your feet or sitting for a long time can cause edema in your legs and ankles. Hot weather may make edema worse. Common medical causes of edema include:  Heart failure.  Liver disease.  Kidney disease.  Weak blood vessels in your legs.  Cancer.  An injury.  Pregnancy.  Some medications.  Obesity. SYMPTOMS  Edema is usually painless.Your skin may look swollen or shiny.  DIAGNOSIS  Your health care provider may be able to diagnose edema by asking about your medical history and doing a  physical exam. You may need to have tests such as X-rays, an electrocardiogram, or blood tests to check for medical conditions that may cause edema.  TREATMENT  Edema treatment depends on the cause. If you have heart, liver, or kidney disease, you need the treatment appropriate for these conditions. General treatment may include:  Elevation of the affected body part above the level of your heart.  Compression of the affected body part. Pressure from elastic bandages or support stockings squeezes the tissues and forces fluid back into the blood vessels. This keeps fluid from entering the tissues.  Restriction of fluid and salt intake.  Use of a water pill (diuretic). These medications are appropriate only for some types of edema. They pull fluid out of your body and make you urinate more often. This gets rid of fluid and reduces swelling, but diuretics can have side effects. Only use diuretics as directed by your health care provider. HOME CARE INSTRUCTIONS   Keep the affected body part above the level of your heart when you are lying down.   Do not sit still or stand for prolonged periods.   Do not put anything directly under your knees when lying down.  Do not wear constricting clothing or garters on your upper legs.   Exercise your legs to work the fluid back into your blood vessels. This may help the swelling go down.   Wear elastic bandages or support stockings to reduce ankle swelling as directed by your health care provider.   Eat a low-salt diet to reduce fluid if your health care provider recommends it.   Only take medicines as directed by your health care provider. SEEK MEDICAL CARE IF:   Your edema is not responding to treatment.  You have heart, liver, or kidney disease and notice symptoms of edema.  You have edema in your legs that does not improve after elevating them.   You have sudden and unexplained weight gain. SEEK IMMEDIATE MEDICAL CARE IF:   You  develop shortness of breath or chest pain.   You cannot breathe when you lie down.  You develop pain, redness, or warmth in the swollen areas.   You have heart, liver, or kidney disease and suddenly get edema.  You have a fever and your symptoms suddenly get worse. MAKE SURE YOU:   Understand these instructions.  Will watch your condition.  Will get help right away if you are not doing well or get worse.   This information is not intended to replace advice given to you by your health care provider. Make sure you discuss any questions you have with your health care provider.   Document Released: 03/08/2005 Document Revised: 03/29/2014 Document Reviewed: 12/29/2012 Elsevier Interactive Patient Education [employer]2016 Elsevier Inc.

## 2015-11-29 NOTE — Telephone Encounter (Signed)
Pt called , message left asking patient to return to [facility]  In order to recheck [facility] labs and BP for consideration of further tx with Magnesium sulfate.

## 2015-11-29 NOTE — MAU Note (Addendum)
Vag delivery 11/16/15. Went home that TUes and had fever and headaches since then. Had high fevers of 100.6 to 102. Came here and told was due to milk coming in and was ok but not getting better. Headache cont and is frontal and back of head and neck. Throbbing like in neck. Dx with uti and started meds yest. Tonight feels "like I have a catheter in". Not breast feeding now. Was pumping but stopped. Today is first day has not pumped.

## 2015-11-29 NOTE — MAU Provider Note (Signed)
Chief Complaint:  No chief complaint on file.   First Provider Initiated Contact with Patient 11/29/15 0145     HPI: [name] is a 30 y.o. [id number] who presents to maternity admissions reporting fever, headache, back pain, breast engorgement and pedal edema.  Had a vaginal delivery on 11/16/15 (just under 2 weeks).  Recently seen for one high BP reading with + proteinuria but other labs normal.  Was not put on any meds.  Also treated for Proteus UTI.  Just started meds yesterday.  Still has dysuria.   Stopped breasfeeding due to baby intolerance.  Has been engorged since. She reports light vaginal bleeding, no vaginal itching/burning, h/a, dizziness, n/v    Fever   This is a recurrent problem. The current episode started in the past 7 days. The problem occurs intermittently. The problem has been waxing and waning. Associated symptoms include headaches and urinary pain. Pertinent negatives include no abdominal pain, congestion, diarrhea, muscle aches, nausea, rash, sore throat, vomiting or wheezing. She has tried NSAIDs for the symptoms. The treatment provided no relief.   RN Note: Vag delivery 11/16/15. Went home that TUes and had fever and headaches since then. Had high fevers of 100.6 to 102. Came here and told was due to milk coming in and was ok but not getting better. Headache cont and is frontal and back of head and neck. Throbbing like in neck. Dx with uti and started meds yest. Tonight feels "like I have a catheter in". Not breast feeding now. Was pumping but stopped. Today is first day has not pumped  Past Medical History: Past Medical History:  Diagnosis Date  . Asthma   . Headache(784.0)   . Pyelonephritis affecting pregnancy in third trimester, antepartum 08/29/2015  . Seasonal allergies     Past obstetric history: OB History  Gravida Para Term Preterm AB Living  3 3 3  0 0 3  SAB TAB Ectopic Multiple Live Births  0 0 0 0 3    # Outcome Date GA Lbr Len/2nd Weight Sex  Delivery Anes PTL Lv  3 Term 11/16/15 [redacted]w[redacted]d 06:47 / 01:15 9 lb 2 oz (4.14 kg) M Vag-Spont EPI  LIV  2 Term 02/27/06 [redacted]w[redacted]d  7 lb 10 oz (3.459 kg) M VBAC EPI Y LIV     Complications: Preterm labor  1 Term 02/13/05 [redacted]w[redacted]d  6 lb 9 oz (2.977 kg) F CS-Unspec EPI  LIV     Complications: Failure to Progress in First Stage      Past Surgical History: Past Surgical History:  Procedure Laterality Date  . CESAREAN SECTION    . TONSILLECTOMY    . WISDOM TOOTH EXTRACTION      Family History: Family History  Problem Relation Age of Onset  . Adopted: Yes  . Family history unknown: Yes    Social History: Social History  Substance Use Topics  . Smoking status: Former Smoker    Packs/day: 0.30    Years: 5.00    Types: Cigarettes    Quit date: 04/10/2015  . Smokeless tobacco: Never Used  . Alcohol use No    Allergies:  Allergies  Allergen Reactions  . Pepto-Bismol [Bismuth] Swelling and Other (See Comments)    Reaction:  Mouth/tongue swelling   . Sage [Salvia Officinalis] Hives, Swelling and Other (See Comments)    Reaction:  Mouth/tongue swelling    Meds:  Prescriptions Prior to Admission  Medication Sig Dispense Refill Last Dose  . albuterol (PROVENTIL HFA;VENTOLIN HFA) 108 (90  Base) MCG/ACT inhaler Inhale 1 puff into the lungs every 6 (six) hours as needed for wheezing or shortness of breath.   11/28/2015 at Unknown time  . cephALEXin (KEFLEX) 500 MG capsule Take 1 capsule (500 mg total) by mouth 3 (three) times daily. 21 capsule 0 11/28/2015 at Unknown time  . ferrous sulfate 325 (65 FE) MG EC tablet Take 325 mg by mouth daily.   11/28/2015 at Unknown time  . fluticasone (FLONASE) 50 MCG/ACT nasal spray Place 2 sprays into both nostrils daily.    11/28/2015 at Unknown time  . ibuprofen (ADVIL,MOTRIN) 600 MG tablet Take 1 tablet (600 mg total) by mouth every 6 (six) hours. 30 tablet 0 Past Week at Unknown time  . oxyCODONE-acetaminophen (PERCOCET/ROXICET) 5-325 MG tablet Take 1-2 tablets by  mouth every 6 (six) hours as needed for moderate pain. 10 tablet 0 11/28/2015 at 2300  . Prenatal Vit-Fe Fumarate-FA (PRENATAL VITAMIN) 27-0.8 MG TABS Take 1 tablet by mouth daily. 30 tablet 12 11/28/2015 at Unknown time    I have reviewed patient's Past Medical Hx, Surgical Hx, Family Hx, Social Hx, medications and allergies.  ROS:  Review of Systems  Constitutional: Positive for fever.  HENT: Negative for congestion and sore throat.   Respiratory: Negative for wheezing.   Gastrointestinal: Negative for abdominal pain, diarrhea, nausea and vomiting.  Genitourinary: Positive for dysuria.  Skin: Negative for rash.  Neurological: Positive for headaches.   Other systems negative     Physical Exam  Patient Vitals for the past 24 hrs:  BP Temp Pulse Resp SpO2 Height Weight  11/29/15 0139 145/85 - 74 - - - -  11/29/15 0112 - - - - 100 % - -  11/29/15 0106 - 98.2 F (36.8 C) 85 18 - 5' 3.5" (1.613 m) 247 lb (112 kg)   Vitals:   11/29/15 0302 11/29/15 0317 11/29/15 0332 11/29/15 0335  BP: 143/85 153/81 156/86 156/86  Pulse: 75 64 62 62  Resp:    18  Temp:    98.6 F (37 C)  SpO2:      Weight:      Height:      144/78 145/78  Constitutional: Well-developed, well-nourished female in no acute distress.  Cardiovascular: normal rate and rhythm, no ectopy audible, S1 & S2 heard, no murmur Breasts engorged, no signs of mastitis Respiratory: normal effort, no distress. Lungs CTAB with no wheezes or crackles GI: Abd soft, non-tender.  Nondistended.  No rebound, No guarding.  Bowel Sounds audible  MS: Extremities nontender, 3+ pedal edema, normal ROM Neurologic: Alert and oriented x 4.   Grossly nonfocal.   DTRs 2+ with no clonus GU: Neg CVAT. Skin:  Warm and Dry Psych:  Affect appropriate.    Labs: Results for orders placed or performed during the hospital encounter of 11/29/15 (from the past 24 hour(s))  Urinalysis, Routine w reflex microscopic (not at [facility])     Status: Abnormal    Collection Time: 11/29/15  1:15 AM  Result Value Ref Range   Color, Urine YELLOW YELLOW   APPearance CLEAR CLEAR   Specific Gravity, Urine 1.020 1.005 - 1.030   pH 6.5 5.0 - 8.0   Glucose, UA NEGATIVE NEGATIVE mg/dL   Hgb urine dipstick LARGE (A) NEGATIVE   Bilirubin Urine NEGATIVE NEGATIVE   Ketones, ur NEGATIVE NEGATIVE mg/dL   Protein, ur 161 (A) NEGATIVE mg/dL   Nitrite NEGATIVE NEGATIVE   Leukocytes, UA MODERATE (A) NEGATIVE  Protein / creatinine ratio, urine  Status: Abnormal   Collection Time: 11/29/15  1:15 AM  Result Value Ref Range   Creatinine, Urine 57.00 mg/dL   Total Protein, Urine 177 mg/dL   Protein Creatinine Ratio 3.11 (H) 0.00 - 0.15 mg/mg[Cre]  Urine microscopic-add on     Status: Abnormal   Collection Time: 11/29/15  1:15 AM  Result Value Ref Range   Squamous Epithelial / LPF 0-5 (A) NONE SEEN   WBC, UA TOO NUMEROUS TO COUNT 0 - 5 WBC/hpf   RBC / HPF TOO NUMEROUS TO COUNT 0 - 5 RBC/hpf   Bacteria, UA FEW (A) NONE SEEN  CBC with Differential/Platelet     Status: Abnormal   Collection Time: 11/29/15  2:06 AM  Result Value Ref Range   WBC 9.3 4.0 - 10.5 K/uL   RBC 3.19 (L) 3.87 - 5.11 MIL/uL   Hemoglobin 8.6 (L) 12.0 - 15.0 g/dL   HCT 81.1 (L) 91.4 - 78.2 %   MCV 81.5 78.0 - 100.0 fL   MCH 27.0 26.0 - 34.0 pg   MCHC 33.1 30.0 - 36.0 g/dL   RDW 95.6 21.3 - 08.6 %   Platelets 387 150 - 400 K/uL   Neutrophils Relative % 80 %   Neutro Abs 7.4 1.7 - 7.7 K/uL   Lymphocytes Relative 15 %   Lymphs Abs 1.4 0.7 - 4.0 K/uL   Monocytes Relative 4 %   Monocytes Absolute 0.4 0.1 - 1.0 K/uL   Eosinophils Relative 1 %   Eosinophils Absolute 0.1 0.0 - 0.7 K/uL   Basophils Relative 0 %   Basophils Absolute 0.0 0.0 - 0.1 K/uL  Comprehensive metabolic panel     Status: Abnormal   Collection Time: 11/29/15  2:06 AM  Result Value Ref Range   Sodium 137 135 - 145 mmol/L   Potassium 3.3 (L) 3.5 - 5.1 mmol/L   Chloride 106 101 - 111 mmol/L   CO2 23 22 - 32 mmol/L    Glucose, Bld 92 65 - 99 mg/dL   BUN 15 6 - 20 mg/dL   Creatinine, Ser 5.78 (H) 0.44 - 1.00 mg/dL   Calcium 8.8 (L) 8.9 - 10.3 mg/dL   Total Protein 6.5 6.5 - 8.1 g/dL   Albumin 2.4 (L) 3.5 - 5.0 g/dL   AST 16 15 - 41 U/L   ALT 19 14 - 54 U/L   Alkaline Phosphatase 86 38 - 126 U/L   Total Bilirubin 0.4 0.3 - 1.2 mg/dL   GFR calc non Af Amer 54 (L) >60 mL/min   GFR calc Af Amer >60 >60 mL/min   Anion gap 8 5 - 15    Imaging:  No results found.  MAU Course/MDM: I have ordered labs as follows: [facility] labs Imaging ordered: none Results reviewed.   Pr/Cr Ratio increased as is Creatinine  LFTs normal. Platelets normal.  Consult Dr [name] Fear. Updated with lab results.  States does not need admission or Magnesium at this time, diastolics are normal.   He recommends giving her 2 weeks of HCTZ and bringing her into clinc early next week to recheck BPs and Creatinine. .   Treatments in MAU included HCTZ for swelling and Fioricet for headache..   Pt stable at time of discharge.  Assessment: Postpartum hypertension  Gestational edema, postpartum  Tension-type headache, not intractable, unspecified chronicity pattern  Creatinine elevation UTI with Proteus  Plan: Discharge home Recommend Start HCTZ for 2 weeks.  Recheck BP and creatinine early next week. Rx sent for Fioricet  for headache Rx sent for HCTZ for edema Continue antibiotics for UTI Message sent to clinic to move her appointment up to early next week to recheck BP and creatinine  Encouraged to return here or to other Urgent Care/ED if she develops worsening of symptoms, increase in pain, fever, or other concerning symptoms.   Wynelle BourgeoisMarie Ondre Salvetti CNM, MSN Certified Nurse-Midwife 11/29/2015 1:46 AM

## 2015-11-29 NOTE — Progress Notes (Signed)
Marie Williams CNM in earlier to discuss test results and d/c plan. Written and verbal d/c instructions given and understanding voiced °

## 2015-11-29 NOTE — Telephone Encounter (Signed)
Phone call to patient after review of Pr/Cr ratio from last evening's visit. Due to elevation of PR/CR ratio to 3.11 from 0

## 2015-12-18 ENCOUNTER — Ambulatory Visit (INDEPENDENT_AMBULATORY_CARE_PROVIDER_SITE_OTHER): Payer: Medicaid Other | Admitting: Advanced Practice Midwife

## 2015-12-18 ENCOUNTER — Encounter: Payer: Self-pay | Admitting: *Deleted

## 2015-12-18 VITALS — BP 112/66 | HR 79 | Ht 63.5 in | Wt 234.5 lb

## 2015-12-18 DIAGNOSIS — Z30430 Encounter for insertion of intrauterine contraceptive device: Secondary | ICD-10-CM | POA: Diagnosis not present

## 2015-12-18 DIAGNOSIS — Z3202 Encounter for pregnancy test, result negative: Secondary | ICD-10-CM | POA: Diagnosis not present

## 2015-12-18 DIAGNOSIS — L259 Unspecified contact dermatitis, unspecified cause: Secondary | ICD-10-CM

## 2015-12-18 LAB — POCT PREGNANCY, URINE: Preg Test, Ur: NEGATIVE

## 2015-12-18 MED ORDER — TRIAMCINOLONE ACETONIDE 0.1 % EX OINT: 1.0000 "application " | TOPICAL_OINTMENT | Freq: Two times a day (BID) | CUTANEOUS | 0 refills | Status: AC

## 2015-12-18 MED ORDER — IBUPROFEN 600 MG PO TABS: 600.0000 mg | ORAL_TABLET | Freq: Four times a day (QID) | ORAL | 1 refills | Status: DC | PRN

## 2015-12-18 MED ORDER — LEVONORGESTREL 18.6 MCG/DAY IU IUD
INTRAUTERINE_SYSTEM | Freq: Once | INTRAUTERINE | Status: AC
  Administered 2015-12-18: 12:00:00 via INTRAUTERINE

## 2015-12-18 NOTE — Progress Notes (Signed)
Subjective:     [name] is a 30 y.o. female who presents for a postpartum visit. She is 4 weeks postpartum following a spontaneous vaginal delivery. I have fully reviewed the prenatal and intrapartum course. The delivery was at 5027w6d gestational weeks. Outcome: spontaneous vaginal delivery. Anesthesia: epidural. Postpartum course has been uncomplicated. Baby's course has been complicated by gall stones. Just got out of hospital for cholecystectomy. Baby is feeding by bottle - Enfamil Pregestimil. Bleeding staining only. Bowel function is normal. Bladder function is normal. Patient is not sexually active. Contraception method is requesting  IUD.  Has had Mirena before and liked it. Postpartum depression screening: negative.  Pt had to stop BF due to baby's health problems. Milk has almost completely stopped, but pt wants to know is she san/should take any medicine to make it stop completely. Denies engorgement or discomfort. Has been using cabbage leaves and ice. Pt developed a discolored area on her right breast. Home health nurse suggested if may be from usignice too much and told her to stop. Area itches. No tenderness, mass or swelling. No fevers.   The following portions of the patient's history were reviewed and updated as appropriate: allergies, current medications, past family history, past medical history, past social history, past surgical history and problem list.  Review of Systems Pertinent items are noted in HPI.   Objective:    BP 112/66   Pulse 79   Ht 5' 3.5" (1.613 m)   Wt 234 lb 8 oz (106.4 kg)   Breastfeeding? No   BMI 40.89 kg/m   General:  alert, cooperative, appears stated age, no distress and moderately obese   Breasts:  Irregular 5x10cm flat, NT, brown area of discoloration w/ slight flaking of skin. No erythema, mass or warmth.   Lungs: clear to auscultation bilaterally  Heart:  regular rate and rhythm, S1, S2 normal, no murmur, click, rub or gallop  Abdomen:  soft, non-tender; bowel sounds normal; no masses,  no organomegaly   Vulva:  normal  Vagina: normal vagina and scant brown blood  Cervix:  multiparous appearance and no cervical motion tenderness  Corpus: normal size, contour, position, consistency, mobility, non-tender  Adnexa:  no mass, fullness, tenderness  Rectal Exam: Not performed.         Patient identified, informed consent performed, signed copy in chart, time out was performed.  Urine pregnancy test negative.  Speculum placed in the vagina.  Cervix visualized.  Cleaned with Betadine x 2.  Grasped anteriourly with a single tooth tenaculum.  Uterus sounded to 7 cm.  Lyletta IUD placed per manufacturer's recommendations.  Strings trimmed to 3 cm.   Patient given post procedure instructions and Mirena care card with expiration date.  Patient is asked to check IUD strings periodically and follow up in 4-6 weeks for IUD check.  Assessment:     Normal postpartum exam. Pap smear not done at today's visit.   Contact dermatitis of breast.   Plan:    1. Contraception: IUD 2. Rx Triamcinolone for skin rash. Rx Ibu.  3. Follow up in: 6 weeks or as needed.

## 2015-12-18 NOTE — Patient Instructions (Signed)

## 2016-01-22 ENCOUNTER — Ambulatory Visit: Payer: Medicaid Other | Admitting: Advanced Practice Midwife

## 2016-01-26 ENCOUNTER — Ambulatory Visit: Payer: Medicaid Other | Admitting: Obstetrics & Gynecology

## 2016-01-27 ENCOUNTER — Ambulatory Visit: Payer: Self-pay | Admitting: Podiatry

## 2016-02-10 ENCOUNTER — Ambulatory Visit (INDEPENDENT_AMBULATORY_CARE_PROVIDER_SITE_OTHER): Payer: Medicaid Other | Admitting: Advanced Practice Midwife

## 2016-02-10 ENCOUNTER — Encounter: Payer: Self-pay | Admitting: Advanced Practice Midwife

## 2016-02-10 VITALS — BP 111/72 | HR 74 | Ht 63.0 in | Wt 242.0 lb

## 2016-02-10 DIAGNOSIS — Z30431 Encounter for routine checking of intrauterine contraceptive device: Secondary | ICD-10-CM

## 2016-02-10 DIAGNOSIS — N921 Excessive and frequent menstruation with irregular cycle: Secondary | ICD-10-CM

## 2016-02-10 DIAGNOSIS — Z975 Presence of (intrauterine) contraceptive device: Secondary | ICD-10-CM

## 2016-02-10 NOTE — Progress Notes (Signed)
Subjective:     Patient ID: [name], female   DOB: 08/08/1985, 30 y.o.   MRN: [id number]  HPI: Here for string check. Had Liletta IUD inserted 12/18/15. Bled for 8 weeks and stopped x 1 week. One episode of low abd cramping x a few minutes. None otherwise.   Review of Systems  Constitutional: Negative for chills and fever.  Gastrointestinal: Positive for abdominal pain (x 1).  Genitourinary: Negative for vaginal bleeding and vaginal discharge.  Neurological: Negative for dizziness.      Objective:   Physical Exam  Constitutional: She appears well-developed and well-nourished. She appears distressed.  Cardiovascular: Normal rate.   Pulmonary/Chest: Effort normal.  Abdominal: Soft. She exhibits no distension. There is no tenderness.  Genitourinary: Vagina normal. Uterus is not enlarged and not tender. Cervix exhibits no motion tenderness, no discharge and no friability. Right adnexum displays no mass and no tenderness. Left adnexum displays no mass and no tenderness. No tenderness or bleeding in the vagina. No vaginal discharge found.  Genitourinary Comments: IUD strings seen. Device NOT coming through external os.    BP 111/72   Pulse 74   Ht 5\' 3"  (1.6 m)   Wt 242 lb (109.8 kg)   Breastfeeding? No   BMI 42.87 kg/m       Assessment:     1. IUD check up 2. Bleeding w/ IUD--Suspect it was normal adjustment to IUD. No evidence of expulsion or malposition.    Plan:    F/u 1 year for annual exam Return to office if bleeding worsens and or is accompanied by significant pain not controlled w/ IBU.  [place] Rani Idler, CNM 02/10/2016 10:43 AM

## 2016-02-10 NOTE — Patient Instructions (Signed)

## 2017-06-21 ENCOUNTER — Encounter: Payer: Self-pay | Admitting: *Deleted

## 2018-04-03 IMAGING — CT CT ANGIO CHEST
2 of 4 series · 18 of 32 positions shown · IV contrast (OMNIPAQUE)
Comparison: 04/04/2006

CLINICAL DATA: Shortness of breath, tachycardia, and tachypneic.

EXAM:
CT ANGIOGRAPHY CHEST WITH CONTRAST
TECHNIQUE: Multidetector CT imaging of the chest was performed using the
standard protocol during bolus administration of intravenous
contrast. Multiplanar CT image reconstructions and MIPs were
obtained to evaluate the vascular anatomy.
CONTRAST:  100 mL Isovue 370

[Series 6: (person_name) thins · axial · 0.62mm/px · z∈[+1069,+1278]mm · 14 of 349 slices shown]
[im 25/349  lung]
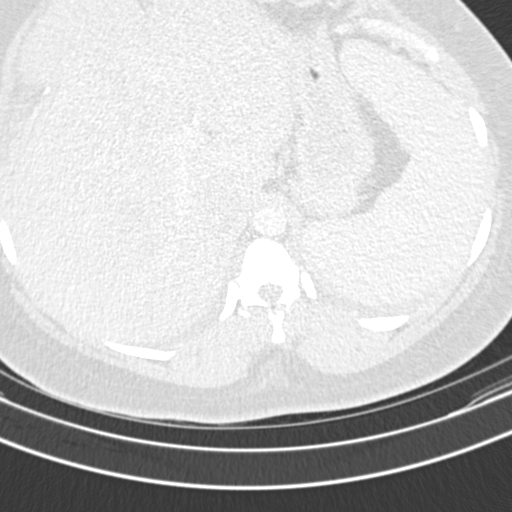
[im 50/349  mediastinal]
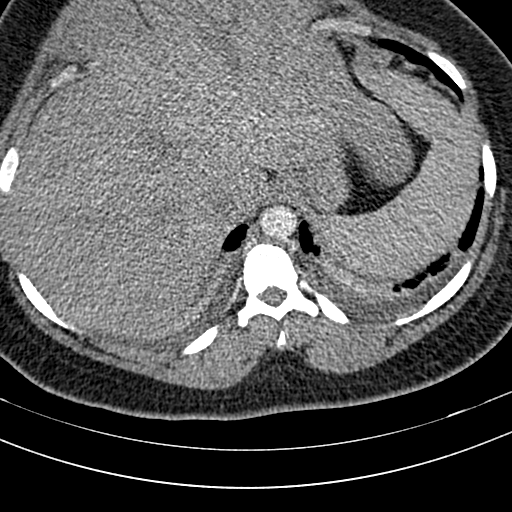
[im 75/349  lung]
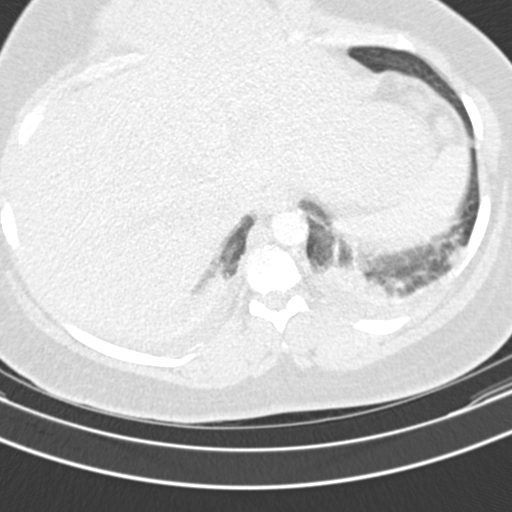
[im 100/349  mediastinal]
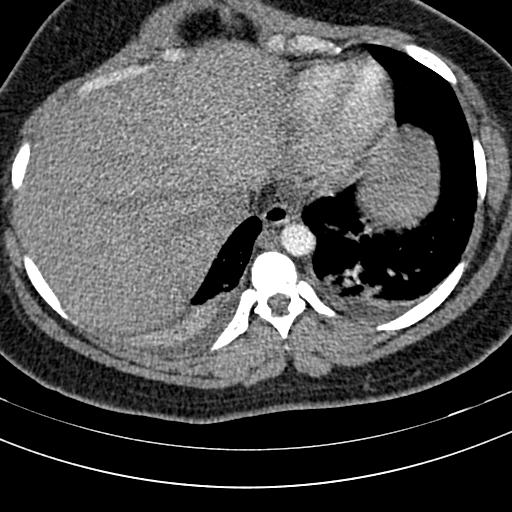
[im 125/349  lung]
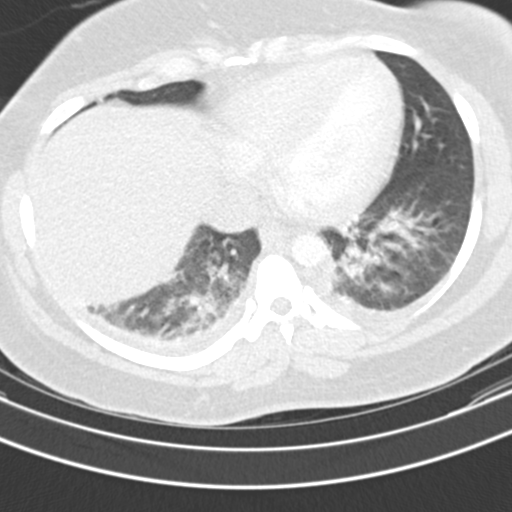
[im 150/349  mediastinal]
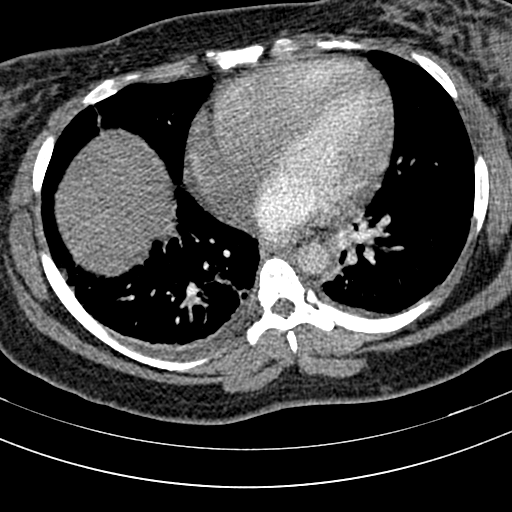
[im 161/349  lung]
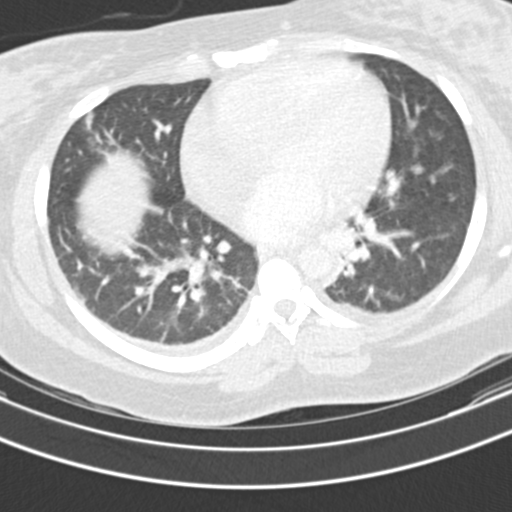
[im 175/349  mediastinal]
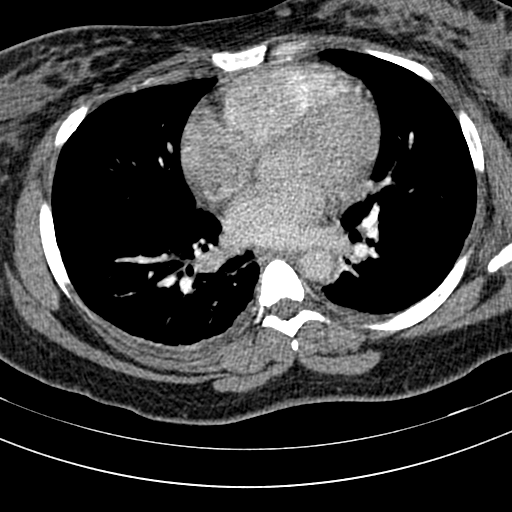
[im 199/349  lung]
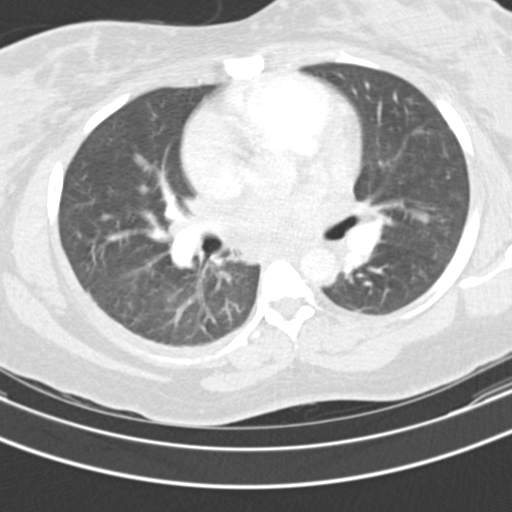
[im 224/349  mediastinal]
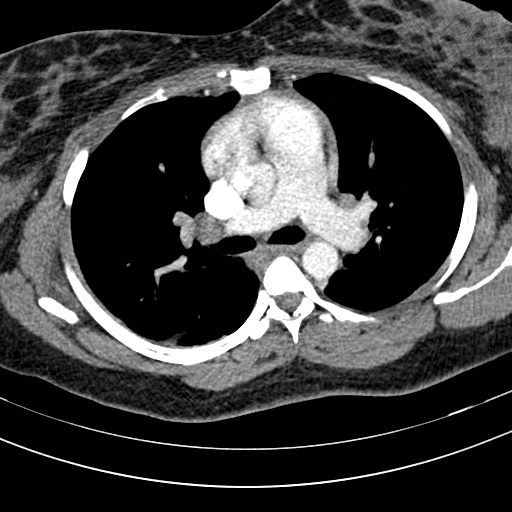
[im 249/349  lung]
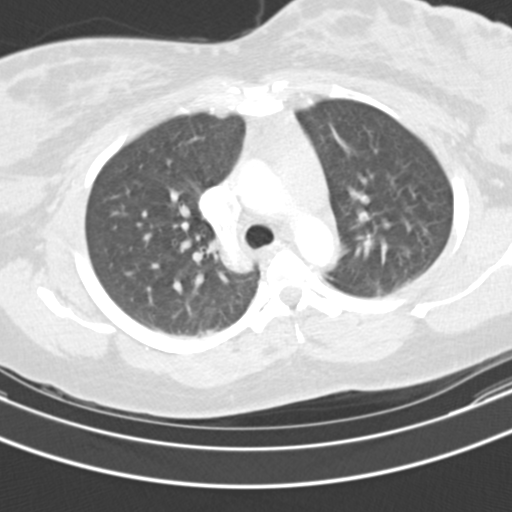
[im 274/349  mediastinal]
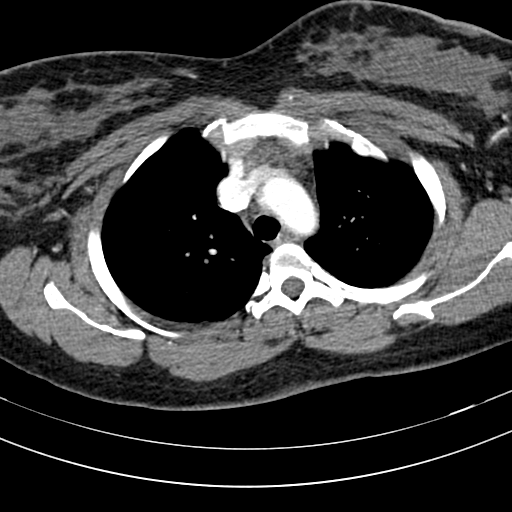
[im 299/349  lung]
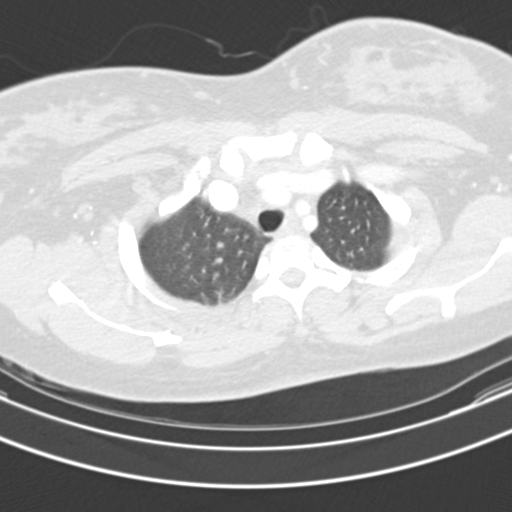
[im 324/349  mediastinal]
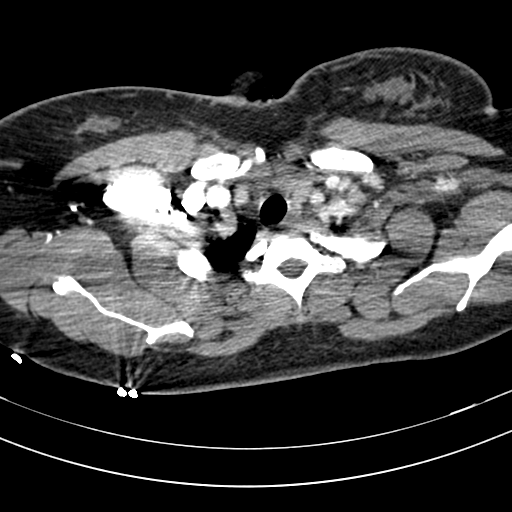

[Series 7: lung · axial · 0.62mm/px · z∈[+1126,+1238]mm · 4 of 114 slices shown]
[im 29/114  mediastinal]
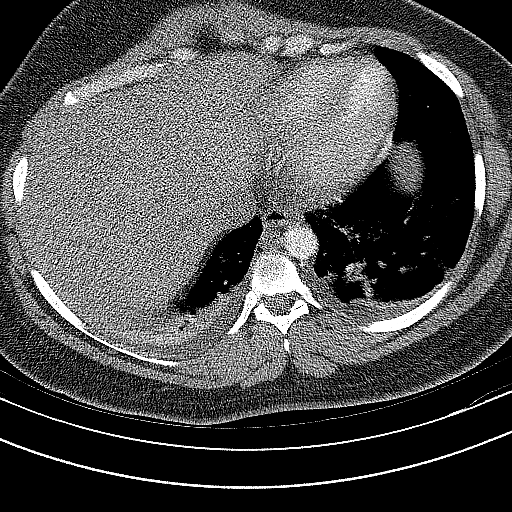
[im 48/114  mediastinal]
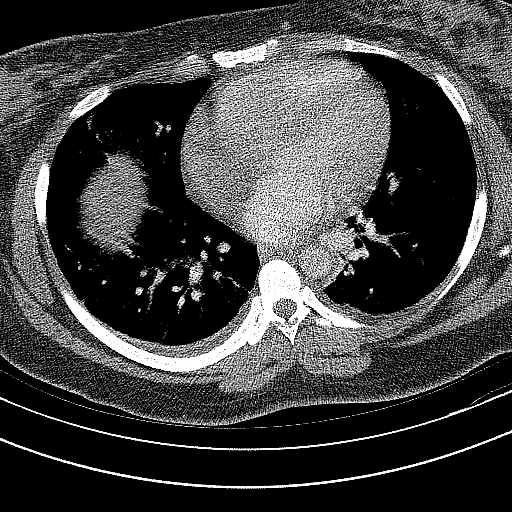
[im 57/114  mediastinal]
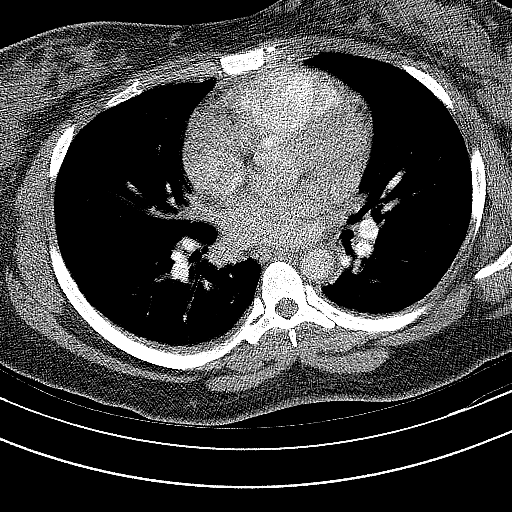
[im 85/114  mediastinal]
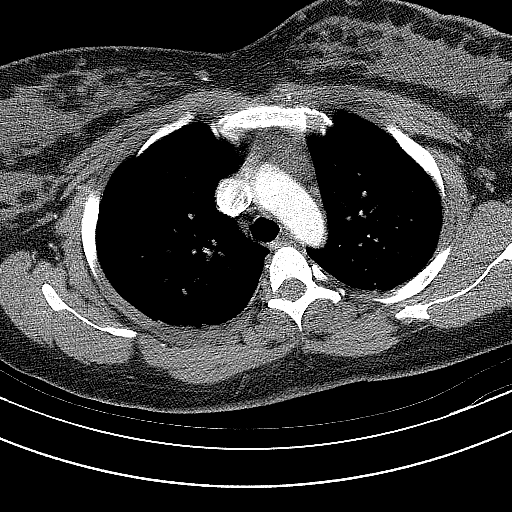

[18 of 32 positions shown; findings below may reference images not displayed]

FINDINGS: Contrast bolus is limited. The main pulmonary arteries and proximal
segmental branches can be moderately well visualized. No large
filling defects demonstrated in the visualize central pulmonary
arteries. Segmental and subsegmental emboli cannot be excluded due
to technique.

Normal heart size. Normal caliber thoracic aorta. Great vessel
origins are patent. Esophagus is decompressed. No significant
lymphadenopathy in the chest.

Motion artifact limits evaluation of the lungs. There are patchy
infiltrative changes suggested in the lung bases. This may indicate
atelectasis or pneumonia. Small bilateral pleural effusions. No
pneumothorax. Airways appear patent.

Included portions of the upper abdominal organs are grossly
unremarkable. No destructive bone lesions.

Review of the MIP images confirms the above findings.
IMPRESSION: Technically limited study. No large central pulmonary emboli are
identified but distal segmental and subsegmental branches are not
well visualized. Small bilateral pleural effusions with patchy
infiltration or atelectasis in the lung bases. This could indicate
pneumonia in the appropriate clinical setting.

## 2019-06-26 ENCOUNTER — Encounter: Payer: Self-pay | Admitting: Obstetrics & Gynecology

## 2019-06-26 ENCOUNTER — Other Ambulatory Visit (HOSPITAL_COMMUNITY)
Admission: RE | Admit: 2019-06-26 | Discharge: 2019-06-26 | Disposition: A | Discharge status: Home / Self Care | Payer: Medicaid Other | Source: Ambulatory Visit | Attending: Obstetrics & Gynecology | Admitting: Obstetrics & Gynecology

## 2019-06-26 ENCOUNTER — Ambulatory Visit (INDEPENDENT_AMBULATORY_CARE_PROVIDER_SITE_OTHER): Payer: Medicaid Other | Admitting: Obstetrics & Gynecology

## 2019-06-26 ENCOUNTER — Other Ambulatory Visit: Payer: Self-pay

## 2019-06-26 VITALS — BP 107/76 | HR 83 | Ht 63.0 in | Wt 244.0 lb

## 2019-06-26 DIAGNOSIS — Z131 Encounter for screening for diabetes mellitus: Secondary | ICD-10-CM

## 2019-06-26 DIAGNOSIS — J410 Simple chronic bronchitis: Secondary | ICD-10-CM | POA: Insufficient documentation

## 2019-06-26 DIAGNOSIS — Z124 Encounter for screening for malignant neoplasm of cervix: Secondary | ICD-10-CM | POA: Diagnosis not present

## 2019-06-26 DIAGNOSIS — Z6841 Body Mass Index (BMI) 40.0 and over, adult: Secondary | ICD-10-CM | POA: Diagnosis not present

## 2019-06-26 DIAGNOSIS — Z01419 Encounter for gynecological examination (general) (routine) without abnormal findings: Secondary | ICD-10-CM

## 2019-06-26 MED ORDER — CLINDAMYCIN HCL 300 MG PO CAPS: 300.0000 mg | ORAL_CAPSULE | Freq: Three times a day (TID) | ORAL | 0 refills | Status: AC

## 2019-06-26 MED ORDER — MINOXIDIL 10 MG PO TABS: 10.0000 mg | ORAL_TABLET | Freq: Every day | ORAL | 3 refills | Status: DC

## 2019-06-26 NOTE — Progress Notes (Signed)
  Subjective:     [name] is a 34 y.o. [phone] and is here for a comprehensive physical exam. The patient reports problems - hair thinning, hair loss, and noticing large patches coming from her scalp worsening over past 5 months.  Pt has lesion under her right axillae for past 6 months. Has had similar lesioins in thigh area bilaterally.  .  Social History   Socioeconomic History  . Marital status: Single    Spouse name: Not on file  . Number of children: 2  . Years of education: Not on file  . Highest education level: Not on file  Occupational History  . Occupation: Dentist: STUDENT  Tobacco Use  . Smoking status: Former Smoker    Packs/day: 0.30    Years: 5.00    Pack years: 1.50    Types: Cigarettes    Quit date: 04/10/2015    Years since quitting: 4.2  . Smokeless tobacco: Never Used  Substance and Sexual Activity  . Alcohol use: No  . Drug use: No  . Sexual activity: Not Currently  Other Topics Concern  . Not on file  Social History Narrative  . Not on file   Social Determinants of Health   Financial Resource Strain:   . Difficulty of Paying Living Expenses:   Food Insecurity:   . Worried About Programme researcher, broadcasting/film/video in the Last Year:   . Barista in the Last Year:   Transportation Needs:   . Freight forwarder (Medical):   Marland Kitchen Lack of Transportation (Non-Medical):   Physical Activity:   . Days of Exercise per Week:   . Minutes of Exercise per Session:   Stress:   . Feeling of Stress :   Social Connections:   . Frequency of Communication with Friends and Family:   . Frequency of Social Gatherings with Friends and Family:   . Attends Religious Services:   . Active Member of Clubs or Organizations:   . Attends Banker Meetings:   Marland Kitchen Marital Status:   Intimate Partner Violence:   . Fear of Current or Ex-Partner:   . Emotionally Abused:   Marland Kitchen Physically Abused:   . Sexually Abused:    Health Maintenance  Topic Date Due   . PAP SMEAR-Modifier  05/05/2018  . INFLUENZA VACCINE  10/21/2019  . TETANUS/TDAP  08/19/2025  . HIV Screening  Completed   jkk  Review of Systems A comprehensive review of systems was negative.   Objective:    BP 107/76   Pulse 83   Ht 5\' 3"  (1.6 m)   Wt 110.7 kg   BMI 43.22 kg/m  General appearance: alert, cooperative, flushed and no distress Head: Normocephalic, without obvious abnormality, atraumatic Throat: lips, mucosa, and tongue normal; teeth and gums normal   abd soft, nontender non distended.  Pelvic: IUD strings noted at external cervical os.     Assessment:    Healthy female;  HS noted on exam, here for annual exam.    Plan:     HS noted on exam, rx clindamycin, may need derm referral.  Hair loss likely overproducion of androgens.  Pt will order PCOS workup and try minoxidil See After Visit Summary for Counseling Recommendations

## 2019-06-26 NOTE — Patient Instructions (Signed)
 Preventive Care 21-35 Years Old, Female Preventive care refers to visits with your health care provider and lifestyle choices that can promote health and wellness. This includes:  A yearly physical exam. This may also be called an annual well check.  Regular dental visits and eye exams.  Immunizations.  Screening for certain conditions.  Healthy lifestyle choices, such as eating a healthy diet, getting regular exercise, not using drugs or products that contain nicotine and tobacco, and limiting alcohol use. What can I expect for my preventive care visit? Physical exam Your health care provider will check your:  Height and weight. This may be used to calculate body mass index (BMI), which tells if you are at a healthy weight.  Heart rate and blood pressure.  Skin for abnormal spots. Counseling Your health care provider may ask you questions about your:  Alcohol, tobacco, and drug use.  Emotional well-being.  Home and relationship well-being.  Sexual activity.  Eating habits.  Work and work environment.  Method of birth control.  Menstrual cycle.  Pregnancy history. What immunizations do I need?  Influenza (flu) vaccine  This is recommended every year. Tetanus, diphtheria, and pertussis (Tdap) vaccine  You may need a Td booster every 10 years. Varicella (chickenpox) vaccine  You may need this if you have not been vaccinated. Human papillomavirus (HPV) vaccine  If recommended by your health care provider, you may need three doses over 6 months. Measles, mumps, and rubella (MMR) vaccine  You may need at least one dose of MMR. You may also need a second dose. Meningococcal conjugate (MenACWY) vaccine  One dose is recommended if you are age 19-21 years and a first-year college student living in a residence hall, or if you have one of several medical conditions. You may also need additional booster doses. Pneumococcal conjugate (PCV13) vaccine  You may need  this if you have certain conditions and were not previously vaccinated. Pneumococcal polysaccharide (PPSV23) vaccine  You may need one or two doses if you smoke cigarettes or if you have certain conditions. Hepatitis A vaccine  You may need this if you have certain conditions or if you travel or work in places where you may be exposed to hepatitis A. Hepatitis B vaccine  You may need this if you have certain conditions or if you travel or work in places where you may be exposed to hepatitis B. Haemophilus influenzae type b (Hib) vaccine  You may need this if you have certain conditions. You may receive vaccines as individual doses or as more than one vaccine together in one shot (combination vaccines). Talk with your health care provider about the risks and benefits of combination vaccines. What tests do I need?  Blood tests  Lipid and cholesterol levels. These may be checked every 5 years starting at age 20.  Hepatitis C test.  Hepatitis B test. Screening  Diabetes screening. This is done by checking your blood sugar (glucose) after you have not eaten for a while (fasting).  Sexually transmitted disease (STD) testing.  BRCA-related cancer screening. This may be done if you have a family history of breast, ovarian, tubal, or peritoneal cancers.  Pelvic exam and Pap test. This may be done every 3 years starting at age 21. Starting at age 30, this may be done every 5 years if you have a Pap test in combination with an HPV test. Talk with your health care provider about your test results, treatment options, and if necessary, the need for more   tests. Follow these instructions at home: Eating and drinking   Eat a diet that includes fresh fruits and vegetables, whole grains, lean protein, and low-fat dairy.  Take vitamin and mineral supplements as recommended by your health care provider.  Do not drink alcohol if: ? Your health care provider tells you not to drink. ? You are  pregnant, may be pregnant, or are planning to become pregnant.  If you drink alcohol: ? Limit how much you have to 0-1 drink a day. ? Be aware of how much alcohol is in your drink. In the [place] one drink equals one 12 oz bottle of beer (355 mL), one 5 oz glass of wine (148 mL), or one 1 oz glass of hard liquor (44 mL). Lifestyle  Take daily care of your teeth and gums.  Stay active. Exercise for at least 30 minutes on 5 or more days each week.  Do not use any products that contain nicotine or tobacco, such as cigarettes, e-cigarettes, and chewing tobacco. If you need help quitting, ask your health care provider.  If you are sexually active, practice safe sex. Use a condom or other form of birth control (contraception) in order to prevent pregnancy and STIs (sexually transmitted infections). If you plan to become pregnant, see your health care provider for a preconception visit. What's next?  Visit your health care provider once a year for a well check visit.  Ask your health care provider how often you should have your eyes and teeth checked.  Stay up to date on all vaccines. This information is not intended to replace advice given to you by your health care provider. Make sure you discuss any questions you have with your health care provider. Document Revised: 11/17/2017 Document Reviewed: 11/17/2017 Elsevier Patient Education  2020 Elsevier Inc.  

## 2019-06-27 LAB — CBC
Hematocrit: 33.4 % — ABNORMAL LOW (ref 34.0–46.6)
Hemoglobin: 10.6 g/dL — ABNORMAL LOW (ref 11.1–15.9)
MCH: 25.6 pg — ABNORMAL LOW (ref 26.6–33.0)
MCHC: 31.7 g/dL (ref 31.5–35.7)
MCV: 81 fL (ref 79–97)
Platelets: 369 10*3/uL (ref 150–450)
RBC: 4.14 x10E6/uL (ref 3.77–5.28)
RDW: 14.3 % (ref 11.7–15.4)
WBC: 5.8 10*3/uL (ref 3.4–10.8)

## 2019-06-27 LAB — TESTOSTERONE: Testosterone: 26 ng/dL (ref 8–48)

## 2019-06-27 LAB — TSH: TSH: 1.44 u[IU]/mL (ref 0.450–4.500)

## 2019-06-27 LAB — DHEA-SULFATE: DHEA-SO4: 294 ug/dL (ref 84.8–378.0)

## 2019-06-28 LAB — CYTOLOGY - PAP
Chlamydia: NEGATIVE
Comment: NEGATIVE
Comment: NEGATIVE
Comment: NORMAL
Diagnosis: NEGATIVE
High risk HPV: NEGATIVE
Neisseria Gonorrhea: NEGATIVE

## 2019-08-09 ENCOUNTER — Other Ambulatory Visit: Payer: Self-pay

## 2019-08-09 ENCOUNTER — Emergency Department (HOSPITAL_COMMUNITY)
Admission: EM | Admit: 2019-08-09 | Discharge: 2019-08-10 | Disposition: A | Discharge status: Home / Self Care | Payer: Medicaid Other | Attending: Emergency Medicine | Admitting: Emergency Medicine

## 2019-08-09 ENCOUNTER — Encounter (HOSPITAL_COMMUNITY): Payer: Self-pay | Admitting: *Deleted

## 2019-08-09 DIAGNOSIS — Z5321 Procedure and treatment not carried out due to patient leaving prior to being seen by health care provider: Secondary | ICD-10-CM | POA: Diagnosis not present

## 2019-08-09 DIAGNOSIS — R519 Headache, unspecified: Secondary | ICD-10-CM | POA: Diagnosis not present

## 2019-08-09 DIAGNOSIS — R1110 Vomiting, unspecified: Secondary | ICD-10-CM | POA: Insufficient documentation

## 2019-08-09 LAB — COMPREHENSIVE METABOLIC PANEL
ALT: 13 U/L (ref 0–44)
AST: 14 U/L — ABNORMAL LOW (ref 15–41)
Albumin: 4.1 g/dL (ref 3.5–5.0)
Alkaline Phosphatase: 54 U/L (ref 38–126)
Anion gap: 12 (ref 5–15)
BUN: 12 mg/dL (ref 6–20)
CO2: 23 mmol/L (ref 22–32)
Calcium: 9.7 mg/dL (ref 8.9–10.3)
Chloride: 106 mmol/L (ref 98–111)
Creatinine, Ser: 1.19 mg/dL — ABNORMAL HIGH (ref 0.44–1.00)
GFR calc Af Amer: >60 mL/min (ref >60)
GFR calc non Af Amer: 59 mL/min — ABNORMAL LOW (ref >60)
Glucose, Bld: 97 mg/dL (ref 70–99)
Potassium: 3.9 mmol/L (ref 3.5–5.1)
Sodium: 141 mmol/L (ref 135–145)
Total Bilirubin: 1 mg/dL (ref 0.3–1.2)
Total Protein: 8.4 g/dL — ABNORMAL HIGH (ref 6.5–8.1)

## 2019-08-09 LAB — URINALYSIS, ROUTINE W REFLEX MICROSCOPIC
Bilirubin Urine: NEGATIVE
Glucose, UA: NEGATIVE mg/dL
Ketones, ur: NEGATIVE mg/dL
Nitrite: NEGATIVE
Protein, ur: 30 mg/dL — AB
Specific Gravity, Urine: 1.019 (ref 1.005–1.030)
WBC, UA: >50 WBC/hpf — ABNORMAL HIGH (ref 0–5)
pH: 6 (ref 5.0–8.0)

## 2019-08-09 LAB — CBC
HCT: 35.9 % — ABNORMAL LOW (ref 36.0–46.0)
Hemoglobin: 11 g/dL — ABNORMAL LOW (ref 12.0–15.0)
MCH: 26.1 pg (ref 26.0–34.0)
MCHC: 30.6 g/dL (ref 30.0–36.0)
MCV: 85.1 fL (ref 80.0–100.0)
Platelets: 342 10*3/uL (ref 150–400)
RBC: 4.22 MIL/uL (ref 3.87–5.11)
RDW: 15.1 % (ref 11.5–15.5)
WBC: 7.9 10*3/uL (ref 4.0–10.5)
nRBC: 0.3 % — ABNORMAL HIGH (ref 0.0–0.2)

## 2019-08-09 LAB — I-STAT BETA HCG BLOOD, ED (MC, WL, AP ONLY): I-stat hCG, quantitative: <5 m[IU]/mL (ref <5)

## 2019-08-09 LAB — LIPASE, BLOOD: Lipase: 20 U/L (ref 11–51)

## 2019-08-09 MED ORDER — SODIUM CHLORIDE 0.9% FLUSH: 3.0000 mL | Freq: Once | INTRAVENOUS | Status: DC

## 2019-08-09 NOTE — ED Notes (Signed)
This tech witnessed pt grab belongings and walk outside

## 2019-08-09 NOTE — ED Triage Notes (Signed)
Pt reports she has had headaches since about 1400 today and vomiting all week. Pt says she has been on abx this week.

## 2019-08-10 NOTE — ED Notes (Signed)
Called for pt x3, no response. 

## 2019-09-25 ENCOUNTER — Other Ambulatory Visit: Payer: Self-pay

## 2019-09-25 ENCOUNTER — Emergency Department (HOSPITAL_COMMUNITY): Payer: Medicaid Other | Admitting: Certified Registered"

## 2019-09-25 ENCOUNTER — Emergency Department (HOSPITAL_COMMUNITY): Payer: Medicaid Other

## 2019-09-25 ENCOUNTER — Ambulatory Visit (HOSPITAL_COMMUNITY)
Admission: EM | Admit: 2019-09-25 | Discharge: 2019-09-25 | Disposition: A | Discharge status: Home / Self Care | Payer: Medicaid Other | Attending: Urology | Admitting: Urology

## 2019-09-25 ENCOUNTER — Encounter (HOSPITAL_COMMUNITY): Payer: Self-pay | Admitting: Certified Registered"

## 2019-09-25 ENCOUNTER — Ambulatory Visit (INDEPENDENT_AMBULATORY_CARE_PROVIDER_SITE_OTHER)
Admission: RE | Admit: 2019-09-25 | Discharge: 2019-09-25 | Disposition: A | Discharge status: Home / Self Care | Payer: Medicaid Other | Source: Ambulatory Visit

## 2019-09-25 ENCOUNTER — Encounter (HOSPITAL_COMMUNITY)
Admission: EM | Disposition: A | Discharge status: Home / Self Care | Payer: Self-pay | Source: Home / Self Care | Attending: Emergency Medicine

## 2019-09-25 VITALS — BP 153/96 | HR 70 | Temp 98.1°F | Resp 18

## 2019-09-25 DIAGNOSIS — J45909 Unspecified asthma, uncomplicated: Secondary | ICD-10-CM | POA: Insufficient documentation

## 2019-09-25 DIAGNOSIS — R1011 Right upper quadrant pain: Secondary | ICD-10-CM

## 2019-09-25 DIAGNOSIS — I10 Essential (primary) hypertension: Secondary | ICD-10-CM | POA: Insufficient documentation

## 2019-09-25 DIAGNOSIS — L732 Hidradenitis suppurativa: Secondary | ICD-10-CM | POA: Diagnosis not present

## 2019-09-25 DIAGNOSIS — Z6841 Body Mass Index (BMI) 40.0 and over, adult: Secondary | ICD-10-CM | POA: Insufficient documentation

## 2019-09-25 DIAGNOSIS — R0602 Shortness of breath: Secondary | ICD-10-CM | POA: Insufficient documentation

## 2019-09-25 DIAGNOSIS — R1031 Right lower quadrant pain: Secondary | ICD-10-CM | POA: Insufficient documentation

## 2019-09-25 DIAGNOSIS — Z79899 Other long term (current) drug therapy: Secondary | ICD-10-CM | POA: Insufficient documentation

## 2019-09-25 DIAGNOSIS — G8929 Other chronic pain: Secondary | ICD-10-CM | POA: Insufficient documentation

## 2019-09-25 DIAGNOSIS — Z20822 Contact with and (suspected) exposure to covid-19: Secondary | ICD-10-CM | POA: Diagnosis not present

## 2019-09-25 DIAGNOSIS — Z9109 Other allergy status, other than to drugs and biological substances: Secondary | ICD-10-CM | POA: Insufficient documentation

## 2019-09-25 DIAGNOSIS — Z87891 Personal history of nicotine dependence: Secondary | ICD-10-CM | POA: Insufficient documentation

## 2019-09-25 DIAGNOSIS — N201 Calculus of ureter: Secondary | ICD-10-CM

## 2019-09-25 DIAGNOSIS — Z87440 Personal history of urinary (tract) infections: Secondary | ICD-10-CM | POA: Insufficient documentation

## 2019-09-25 DIAGNOSIS — N136 Pyonephrosis: Secondary | ICD-10-CM | POA: Diagnosis not present

## 2019-09-25 DIAGNOSIS — D649 Anemia, unspecified: Secondary | ICD-10-CM | POA: Diagnosis not present

## 2019-09-25 DIAGNOSIS — Z7951 Long term (current) use of inhaled steroids: Secondary | ICD-10-CM | POA: Diagnosis not present

## 2019-09-25 DIAGNOSIS — R519 Headache, unspecified: Secondary | ICD-10-CM | POA: Insufficient documentation

## 2019-09-25 DIAGNOSIS — E282 Polycystic ovarian syndrome: Secondary | ICD-10-CM | POA: Insufficient documentation

## 2019-09-25 DIAGNOSIS — Z888 Allergy status to other drugs, medicaments and biological substances status: Secondary | ICD-10-CM | POA: Diagnosis not present

## 2019-09-25 DIAGNOSIS — B964 Proteus (mirabilis) (morganii) as the cause of diseases classified elsewhere: Secondary | ICD-10-CM | POA: Diagnosis not present

## 2019-09-25 DIAGNOSIS — R198 Other specified symptoms and signs involving the digestive system and abdomen: Secondary | ICD-10-CM

## 2019-09-25 HISTORY — DX: Polycystic ovarian syndrome: E28.2

## 2019-09-25 HISTORY — DX: Anemia, unspecified: D64.9

## 2019-09-25 HISTORY — PX: CYSTOSCOPY W/ URETERAL STENT PLACEMENT: SHX1429

## 2019-09-25 LAB — COMPREHENSIVE METABOLIC PANEL
ALT: 11 U/L (ref 0–44)
AST: 18 U/L (ref 15–41)
Albumin: 3.7 g/dL (ref 3.5–5.0)
Alkaline Phosphatase: 41 U/L (ref 38–126)
Anion gap: 9 (ref 5–15)
BUN: 11 mg/dL (ref 6–20)
CO2: 26 mmol/L (ref 22–32)
Calcium: 9.2 mg/dL (ref 8.9–10.3)
Chloride: 104 mmol/L (ref 98–111)
Creatinine, Ser: 1.14 mg/dL — ABNORMAL HIGH (ref 0.44–1.00)
GFR calc Af Amer: >60 mL/min (ref >60)
GFR calc non Af Amer: >60 mL/min (ref >60)
Glucose, Bld: 97 mg/dL (ref 70–99)
Potassium: 4.3 mmol/L (ref 3.5–5.1)
Sodium: 139 mmol/L (ref 135–145)
Total Bilirubin: 0.6 mg/dL (ref 0.3–1.2)
Total Protein: 8.1 g/dL (ref 6.5–8.1)

## 2019-09-25 LAB — URINALYSIS, ROUTINE W REFLEX MICROSCOPIC
Bilirubin Urine: NEGATIVE
Glucose, UA: NEGATIVE mg/dL
Ketones, ur: NEGATIVE mg/dL
Nitrite: NEGATIVE
Protein, ur: 100 mg/dL — AB
Specific Gravity, Urine: 1.03 (ref 1.005–1.030)
pH: 6 (ref 5.0–8.0)

## 2019-09-25 LAB — CBC
HCT: 32.4 % — ABNORMAL LOW (ref 36.0–46.0)
Hemoglobin: 10.2 g/dL — ABNORMAL LOW (ref 12.0–15.0)
MCH: 26.6 pg (ref 26.0–34.0)
MCHC: 31.5 g/dL (ref 30.0–36.0)
MCV: 84.6 fL (ref 80.0–100.0)
Platelets: 321 10*3/uL (ref 150–400)
RBC: 3.83 MIL/uL — ABNORMAL LOW (ref 3.87–5.11)
RDW: 14.3 % (ref 11.5–15.5)
WBC: 4.2 10*3/uL (ref 4.0–10.5)
nRBC: 0 % (ref 0.0–0.2)

## 2019-09-25 LAB — SARS CORONAVIRUS 2 BY RT PCR (HOSPITAL ORDER, PERFORMED IN ~~LOC~~ HOSPITAL LAB): SARS Coronavirus 2: NEGATIVE

## 2019-09-25 LAB — I-STAT BETA HCG BLOOD, ED (MC, WL, AP ONLY): I-stat hCG, quantitative: <5 m[IU]/mL (ref <5)

## 2019-09-25 LAB — LIPASE, BLOOD: Lipase: 19 U/L (ref 11–51)

## 2019-09-25 SURGERY — CYSTOSCOPY, WITH RETROGRADE PYELOGRAM AND URETERAL STENT INSERTION
Anesthesia: General | Laterality: Left

## 2019-09-25 MED ORDER — LIDOCAINE 2% (20 MG/ML) 5 ML SYRINGE
INTRAMUSCULAR | Status: DC | PRN
  Administered 2019-09-25: 100 mg via INTRAVENOUS

## 2019-09-25 MED ORDER — PROPOFOL 10 MG/ML IV BOLUS
INTRAVENOUS | Status: DC | PRN
  Administered 2019-09-25: 200 mg via INTRAVENOUS

## 2019-09-25 MED ORDER — FENTANYL CITRATE (PF) 100 MCG/2ML IJ SOLN
INTRAMUSCULAR | Status: DC | PRN
  Administered 2019-09-25 (×4): 50 ug via INTRAVENOUS

## 2019-09-25 MED ORDER — MIDAZOLAM HCL 2 MG/2ML IJ SOLN
INTRAMUSCULAR | Status: DC | PRN
  Administered 2019-09-25: 2 mg via INTRAVENOUS

## 2019-09-25 MED ORDER — SODIUM CHLORIDE 0.9% FLUSH
3.0000 mL | Freq: Once | INTRAVENOUS | Status: AC
  Administered 2019-09-25: 3 mL via INTRAVENOUS

## 2019-09-25 MED ORDER — SULFAMETHOXAZOLE-TRIMETHOPRIM 400-80 MG PO TABS: 1.0000 | ORAL_TABLET | Freq: Two times a day (BID) | ORAL | 0 refills | Status: DC

## 2019-09-25 MED ORDER — MORPHINE SULFATE (PF) 4 MG/ML IV SOLN
4.0000 mg | Freq: Once | INTRAVENOUS | Status: AC
  Administered 2019-09-25: 4 mg via INTRAVENOUS
  Filled 2019-09-25: qty 1

## 2019-09-25 MED ORDER — CEFAZOLIN SODIUM-DEXTROSE 2-3 GM-%(50ML) IV SOLR
INTRAVENOUS | Status: DC | PRN
  Administered 2019-09-25: 2 g via INTRAVENOUS

## 2019-09-25 MED ORDER — CHLORHEXIDINE GLUCONATE 0.12 % MT SOLN
15.0000 mL | Freq: Once | OROMUCOSAL | Status: AC
  Administered 2019-09-25: 15 mL via OROMUCOSAL

## 2019-09-25 MED ORDER — TRAMADOL HCL 50 MG PO TABS: 50.0000 mg | ORAL_TABLET | Freq: Four times a day (QID) | ORAL | 0 refills | Status: DC | PRN

## 2019-09-25 MED ORDER — ACETAMINOPHEN 325 MG PO TABS
325.0000 mg | ORAL_TABLET | ORAL | Status: DC | PRN
Start: 2019-09-25 — End: 2019-09-26

## 2019-09-25 MED ORDER — OXYCODONE HCL 5 MG/5ML PO SOLN: 5.0000 mg | Freq: Once | ORAL | Status: DC | PRN

## 2019-09-25 MED ORDER — FENTANYL CITRATE (PF) 100 MCG/2ML IJ SOLN: 25.0000 ug | INTRAMUSCULAR | Status: DC | PRN

## 2019-09-25 MED ORDER — OXYCODONE HCL 5 MG PO TABS: 5.0000 mg | ORAL_TABLET | Freq: Once | ORAL | Status: DC | PRN

## 2019-09-25 MED ORDER — CEFAZOLIN SODIUM-DEXTROSE 2-4 GM/100ML-% IV SOLN: 2.0000 g | Freq: Once | INTRAVENOUS | Status: DC

## 2019-09-25 MED ORDER — SODIUM CHLORIDE 0.9 % IR SOLN
Status: DC | PRN
  Administered 2019-09-25: 3000 mL via INTRAVESICAL

## 2019-09-25 MED ORDER — IOHEXOL 300 MG/ML  SOLN
INTRAMUSCULAR | Status: DC | PRN
  Administered 2019-09-25: 15 mL

## 2019-09-25 MED ORDER — SODIUM CHLORIDE (PF) 0.9 % IJ SOLN
INTRAMUSCULAR | Status: AC
  Filled 2019-09-25: qty 50

## 2019-09-25 MED ORDER — PHENAZOPYRIDINE HCL 200 MG PO TABS
200.0000 mg | ORAL_TABLET | Freq: Three times a day (TID) | ORAL | 0 refills | Status: DC | PRN
Start: 2019-09-25 — End: 2019-12-17

## 2019-09-25 MED ORDER — ONDANSETRON HCL 4 MG/2ML IJ SOLN
INTRAMUSCULAR | Status: DC | PRN
  Administered 2019-09-25: 4 mg via INTRAVENOUS

## 2019-09-25 MED ORDER — LACTATED RINGERS IV SOLN: INTRAVENOUS | Status: DC

## 2019-09-25 MED ORDER — DEXAMETHASONE SODIUM PHOSPHATE 10 MG/ML IJ SOLN
INTRAMUSCULAR | Status: DC | PRN
  Administered 2019-09-25: 10 mg via INTRAVENOUS

## 2019-09-25 MED ORDER — ACETAMINOPHEN 160 MG/5ML PO SOLN: 325.0000 mg | ORAL | Status: DC | PRN

## 2019-09-25 MED ORDER — KETOROLAC TROMETHAMINE 30 MG/ML IJ SOLN
INTRAMUSCULAR | Status: DC | PRN
  Administered 2019-09-25: 30 mg via INTRAVENOUS

## 2019-09-25 MED ORDER — MIDAZOLAM HCL 2 MG/2ML IJ SOLN
INTRAMUSCULAR | Status: AC
  Filled 2019-09-25: qty 2

## 2019-09-25 MED ORDER — PROPOFOL 10 MG/ML IV BOLUS
INTRAVENOUS | Status: AC
  Filled 2019-09-25: qty 20

## 2019-09-25 MED ORDER — FENTANYL CITRATE (PF) 100 MCG/2ML IJ SOLN
INTRAMUSCULAR | Status: AC
  Filled 2019-09-25: qty 2

## 2019-09-25 MED ORDER — MEPERIDINE HCL 50 MG/ML IJ SOLN: 6.2500 mg | INTRAMUSCULAR | Status: DC | PRN

## 2019-09-25 MED ORDER — LACTATED RINGERS IV BOLUS
1000.0000 mL | Freq: Once | INTRAVENOUS | Status: AC
  Administered 2019-09-25: 1000 mL via INTRAVENOUS

## 2019-09-25 MED ORDER — IOHEXOL 300 MG/ML  SOLN
100.0000 mL | Freq: Once | INTRAMUSCULAR | Status: AC | PRN
  Administered 2019-09-25: 100 mL via INTRAVENOUS

## 2019-09-25 MED ORDER — CEFAZOLIN SODIUM-DEXTROSE 2-4 GM/100ML-% IV SOLN
INTRAVENOUS | Status: AC
  Filled 2019-09-25: qty 100

## 2019-09-25 MED ORDER — PROMETHAZINE HCL 25 MG/ML IJ SOLN
12.5000 mg | Freq: Once | INTRAMUSCULAR | Status: AC
  Administered 2019-09-25: 12.5 mg via INTRAVENOUS
  Filled 2019-09-25: qty 1

## 2019-09-25 MED ORDER — ONDANSETRON HCL 4 MG/2ML IJ SOLN: 4.0000 mg | Freq: Once | INTRAMUSCULAR | Status: DC | PRN

## 2019-09-25 SURGICAL SUPPLY — 19 items
BAG URO CATCHER STRL LF (MISCELLANEOUS) ×3 IMPLANT
BASKET ZERO TIP NITINOL 2.4FR (BASKET) IMPLANT
BSKT STON RTRVL ZERO TP 2.4FR (BASKET)
CATH URET 5FR 28IN OPEN ENDED (CATHETERS) ×3 IMPLANT
CLOTH BEACON ORANGE TIMEOUT ST (SAFETY) ×3 IMPLANT
EXTRACTOR STONE 1.7FRX115CM (UROLOGICAL SUPPLIES) IMPLANT
GLOVE BIOGEL M STRL SZ7.5 (GLOVE) ×3 IMPLANT
GOWN STRL REUS W/TWL XL LVL3 (GOWN DISPOSABLE) ×3 IMPLANT
GUIDEWIRE ANG ZIPWIRE 038X150 (WIRE) IMPLANT
GUIDEWIRE STR DUAL SENSOR (WIRE) ×3 IMPLANT
KIT TURNOVER KIT A (KITS) IMPLANT
MANIFOLD NEPTUNE II (INSTRUMENTS) ×3 IMPLANT
PACK CYSTO (CUSTOM PROCEDURE TRAY) ×3 IMPLANT
SHEATH URETERAL 12FRX28CM (UROLOGICAL SUPPLIES) IMPLANT
SHEATH URETERAL 12FRX35CM (MISCELLANEOUS) IMPLANT
STENT URET 6FRX24 CONTOUR (STENTS) ×2 IMPLANT
TUBING CONNECTING 10 (TUBING) ×2 IMPLANT
TUBING CONNECTING 10' (TUBING) ×1
TUBING UROLOGY SET (TUBING) ×3 IMPLANT

## 2019-09-25 NOTE — ED Triage Notes (Signed)
Patient from urgent care. Patient reports she was told to come here to get scanned for possible gallstones. Patient reports nausea and emesis that is no longer controlled with zofran

## 2019-09-25 NOTE — Anesthesia Preprocedure Evaluation (Signed)
Anesthesia Evaluation  Patient identified by MRN, date of birth, ID band Patient awake    Reviewed: Allergy & Precautions, H&P , NPO status , Patient's Chart, lab work & pertinent test results, reviewed documented beta blocker date and time   Airway Mallampati: I  TM Distance: >3 FB Neck ROM: full    Dental no notable dental hx. (+) Teeth Intact, Dental Advisory Given   Pulmonary shortness of breath, asthma , former smoker,    Pulmonary exam normal breath sounds clear to auscultation       Cardiovascular Exercise Tolerance: Good hypertension, Pt. on medications  Rhythm:regular Rate:Normal     Neuro/Psych  Headaches, negative psych ROS   GI/Hepatic negative GI ROS, Neg liver ROS,   Endo/Other  Morbid obesity  Renal/GU negative Renal ROS  negative genitourinary   Musculoskeletal   Abdominal (+) + obese,   Peds  Hematology  (+) Blood dyscrasia, anemia ,   Anesthesia Other Findings   Reproductive/Obstetrics negative OB ROS                             Anesthesia Physical Anesthesia Plan  ASA: III and emergent  Anesthesia Plan: General   Post-op Pain Management:    Induction:   PONV Risk Score and Plan: 3 and Ondansetron, Dexamethasone and Treatment may vary due to age or medical condition  Airway Management Planned: LMA  Additional Equipment:   Intra-op Plan:   Post-operative Plan:   Informed Consent: I have reviewed the patients History and Physical, chart, labs and discussed the procedure including the risks, benefits and alternatives for the proposed anesthesia with the patient or authorized representative who has indicated his/her understanding and acceptance.     Dental Advisory Given  Plan Discussed with: CRNA and Anesthesiologist  Anesthesia Plan Comments:         Anesthesia Quick Evaluation

## 2019-09-25 NOTE — Progress Notes (Signed)
Patient stated at D/C that she needs a Doctor's note stating medical necessity to be out of work to submit to her insurance company for disability coverage.  I informed her the surgeon was no longer at the hospital but she could call the office in the morning to have that taken care of for her.  She verbalized agreement with that plan and she would call them in the morning.

## 2019-09-25 NOTE — ED Provider Notes (Signed)
[facility]    CSN: [id number] Arrival date & time: 09/25/19  0940      History   Chief Complaint Chief Complaint  Patient presents with   Emesis    HPI [name] is a 34 y.o. female.   34 year old female with history of PCOS, asthma, HS comes in for acute worsening of chronic abdominal pain.  States symptoms has been going on for over 6 months, but with acute worsening the past few days.  Has right upper quadrant/right lower quadrant pain, worse with fatty foods.  Associated nausea/vomiting that was at first relieved with Zofran.  Now states Zofran no longer controlling vomiting.  She has sharp shooting pains at times to her right upper quadrant regardless of food intake.  Chills without fever.  Of note, patient was recently diagnosed with PCOS, HS. in the past 3 months, has had antibiotics for HS as well as UTI. Cefdinir (06/2019), doxycycline (4-07/2019), keflex (08/2019).  No obvious changes of symptoms while on antibiotics.     Past Medical History:  Diagnosis Date   Anemia    Asthma    Headache(784.0)    PCOS (polycystic ovarian syndrome)    Pyelonephritis affecting pregnancy in third trimester, antepartum 08/29/2015   Seasonal allergies     Patient Active Problem List   Diagnosis Date Noted   Simple chronic bronchitis (HCC) 06/26/2019   Nephrolithiasis 10/06/2015   Previous cesarean section 05/06/2015   Morbid obesity with BMI of 40.0-44.9, adult (HCC) 05/06/2015   Asthma 08/05/2011   Dyspnea 07/07/2011   Edema 07/07/2011    Past Surgical History:  Procedure Laterality Date   CESAREAN SECTION     TONSILLECTOMY     WISDOM TOOTH EXTRACTION      OB History    Gravida  3   Para  3   Term  3   Preterm  0   AB  0   Living  3     SAB  0   TAB  0   Ectopic  0   Multiple  0   Live Births  3            Home Medications    Prior to Admission medications   Medication Sig Start Date End Date Taking?  Authorizing Provider  ergocalciferol (VITAMIN D2) 1.25 MG (50000 UT) capsule Take 50,000 Units by mouth once a week.   Yes [provider]  furosemide (LASIX) 20 MG tablet Take 20 mg by mouth.   Yes [provider]  ondansetron (ZOFRAN) 4 MG tablet Take 4 mg by mouth every 8 (eight) hours as needed for nausea or vomiting.   Yes [provider]  albuterol (PROVENTIL HFA;VENTOLIN HFA) 108 (90 Base) MCG/ACT inhaler Inhale 1 puff into the lungs every 6 (six) hours as needed for wheezing or shortness of breath.    [provider]  minoxidil (LONITEN) 10 MG tablet Take 1 tablet (10 mg total) by mouth daily. 06/26/19   Malachy Chamber, MD  Multiple Vitamin (MULTIVITAMIN) capsule Take 1 capsule by mouth daily.    [provider]    Family History Family History  Adopted: Yes  Family history unknown: Yes    Social History Social History   Tobacco Use   Smoking status: Former Smoker    Packs/day: 0.30    Years: 5.00    Pack years: 1.50    Types: Cigarettes    Quit date: 04/10/2015    Years since quitting: 4.4  Smokeless tobacco: Never Used  Substance Use Topics   Alcohol use: Yes    Comment: social   Drug use: No     Allergies   Pepto-bismol [bismuth] and Earnestine Leys officinalis]   Review of Systems Review of Systems  Reason unable to perform ROS: See HPI as above.     Physical Exam Triage Vital Signs ED Triage Vitals [09/25/19 0956]  Enc Vitals Group     BP (!) 153/96     Pulse Rate 70     Resp 18     Temp 98.1 F (36.7 C)     Temp Source Oral     SpO2 97 %     Weight      Height      Head Circumference      Peak Flow      Pain Score 8     Pain Loc      Pain Edu?      Excl. in GC?    No data found.  Updated Vital Signs BP (!) 153/96 (BP Location: Left Arm)    Pulse 70    Temp 98.1 F (36.7 C) (Oral)    Resp 18    SpO2 97%   Physical Exam Constitutional:      General: She is not in acute distress.     Appearance: She is well-developed. She is not ill-appearing, toxic-appearing or diaphoretic.  HENT:     Head: Normocephalic and atraumatic.  Eyes:     Conjunctiva/sclera: Conjunctivae normal.     Pupils: Pupils are equal, round, and reactive to light.  Cardiovascular:     Rate and Rhythm: Normal rate and regular rhythm.  Pulmonary:     Effort: Pulmonary effort is normal. No respiratory distress.     Comments: LCTAB Abdominal:     General: Bowel sounds are normal.     Palpations: Abdomen is soft.     Comments: RUQ pain with guarding. +Murphy's.  RLQ and LUQ tenderness without guarding or rebound  Musculoskeletal:     Cervical back: Normal range of motion and neck supple.  Skin:    General: Skin is warm and dry.  Neurological:     Mental Status: She is alert and oriented to person, place, and time.  Psychiatric:        Behavior: Behavior normal.        Judgment: Judgment normal.      UC Treatments / Results  Labs (all labs ordered are listed, but only abnormal results are displayed) Labs Reviewed - No data to display  EKG   Radiology No results found.  Procedures Procedures (including critical care time)  Medications Ordered in UC Medications - No data to display  Initial Impression / Assessment and Plan / UC Course  I have reviewed the triage vital signs and the nursing notes.  Pertinent labs & imaging results that were available during my care of the patient were reviewed by me and considered in my medical decision making (see chart for details).    34 year old female with history of PCOS, asthma, HS comes in for acute worsening of chronic abdominal pain.  Has right upper quadrant/right lower quadrant pain, worse with fatty foods.  Associated nausea/vomiting that was at first relieved with Zofran.  Now states Zofran no longer controlling vomiting.  She has sharp shooting pains at times to her right upper quadrant regardless of food intake.  Chills without  fever.  Heart: RRR Lungs: LCTAB ABD: soft, +BS, RUQ pain  and guarding, no rebound. Tenderness to palpation to RLQ, LUQ without guarding or rebound.  Given history and exam, worries for gallbladder stones.  Given worsening symptoms now with chills, worries for cholecystitis.  Will discharge in stable condition to the ED for further evaluation.  Final Clinical Impressions(s) / UC Diagnoses   Final diagnoses:  RUQ pain  RUQ guarding    ED Prescriptions    None     PDMP not reviewed this encounter.   Belinda Fisher, PA-C 09/25/19 1125

## 2019-09-25 NOTE — Discharge Instructions (Addendum)
34 year old female with history of PCOS, asthma, HS comes in for acute worsening of chronic abdominal pain.  Has right upper quadrant/right lower quadrant pain, worse with fatty foods.  Associated nausea/vomiting that was at first relieved with Zofran.  Now states Zofran no longer controlling vomiting.  She has sharp shooting pains at times to her right upper quadrant regardless of food intake.  Chills without fever.  Heart: RRR Lungs: LCTAB ABD: soft, +BS, RUQ pain and guarding, no rebound. Tenderness to palpation to RLQ, LUQ without guarding or rebound.   Given history and exam, worries for gallbladder stones.  Given worsening symptoms now with chills, worries for cholecystitis.  Will discharge in stable condition to the ED for further evaluation.

## 2019-09-25 NOTE — Op Note (Signed)
Preoperative diagnosis:  1. Left infected/obstructing ureteral stone   Postoperative diagnosis:  1. same   Procedure:  1. Cystoscopy 2. left ureteral stent placement 3. left retrograde pyelography with interpretation   Surgeon: Crist Fat, MD  Anesthesia: General  Complications: None  Intraoperative findings:  left retrograde pyelography demonstrated a filling defect within the left ureter consistent with the patients known calculus.  The remaining ureter was hydronephrotic and appeared edematous under fluoroscopy.  EBL: Minimal  Specimens: left renal pelvis culture  Indication: [name] is a 34 y.o. patient with an obstucting left ureteral stone and evidence of UTI. After reviewing the management options for treatment, he elected to proceed with the above surgical procedure(s). We have discussed the potential benefits and risks of the procedure, side effects of the proposed treatment, the likelihood of the patient achieving the goals of the procedure, and any potential problems that might occur during the procedure or recuperation. Informed consent has been obtained.  Description of procedure:  The patient was taken to the operating room and general anesthesia was induced.  The patient was placed in the dorsal lithotomy position, prepped and draped in the usual sterile fashion, and preoperative antibiotics were administered. A preoperative time-out was performed.   Cystourethroscopy was performed.  The patients urethra was examined and was normal. The bladder was then systematically examined in its entirety. There was no evidence for any bladder tumors, stones, or other mucosal pathology.    Attention then turned to the leftureteral orifice and a ureteral catheter was used to intubate the ureteral orifice.  Omnipaque contrast was injected through the ureteral catheter and a retrograde pyelogram was performed with findings as dictated above.  A 0.38 sensor  guidewire was then advanced up the left ureter into the renal pelvis under fluoroscopic guidance.  The wire was then backloaded through the cystoscope and a ureteral stent was advance over the wire using Seldinger technique.  The stent was positioned appropriately under fluoroscopic and cystoscopic guidance.  The wire was then removed with an adequate stent curl noted in the renal pelvis as well as in the bladder.  The bladder was then emptied and the procedure ended.  The patient appeared to tolerate the procedure well and without complications.  The patient was able to be awakened and transferred to the recovery unit in satisfactory condition.    Crist Fat, M.D.

## 2019-09-25 NOTE — ED Triage Notes (Signed)
Pt c/o n/v, lower abdominal pain, RUQ pain, lower back pain, and no appetite for over 81months. States has seen her OBGYN and PCP. States can't get back in to PCP until 7/27, was told to come here for labs.

## 2019-09-25 NOTE — H&P (View-Only) (Signed)
I have been asked to see the patient by Dr. Dan Floyd, for evaluation and management of obstructing left ureteral stone with evidence of infection.  History of present illness: 34-year-old female who presented to the emergency department with worsening left-sided flank pain.  Her pain began about a month ago and she was seen in the urgent care where she was treated for an infection.  Lots of her symptoms improved, but she is continued to have nausea and abdominal pain.  Pain persisted and progressed over the last few days to the point where the patient needed some more relief and so she presented to the emergency department.  In the emergency department the patient was noted to have normal labs and a urine analysis significant for leukocytes and white blood cells.  A CT scan was performed which demonstrated some thickening of the ureter and some stranding around her left kidney as well as may be atrophy of that left kidney.  In addition, the patient had a stone that was causing obstruction in the left distal ureter as well as this larger nonobstructing stone in the left lower pole.  Her pain was severe, but the patient was otherwise hemodynamically stable.   Review of systems: A 12 point comprehensive review of systems was obtained and is negative unless otherwise stated in the history of present illness.  Patient Active Problem List   Diagnosis Date Noted  . Simple chronic bronchitis (HCC) 06/26/2019  . Nephrolithiasis 10/06/2015  . Previous cesarean section 05/06/2015  . Morbid obesity with BMI of 40.0-44.9, adult (HCC) 05/06/2015  . Asthma 08/05/2011  . Dyspnea 07/07/2011  . Edema 07/07/2011    No current facility-administered medications on file prior to encounter.   Current Outpatient Medications on File Prior to Encounter  Medication Sig Dispense Refill  . acetaminophen (TYLENOL) 325 MG tablet Take 650 mg by mouth every 6 (six) hours as needed for mild pain or headache.    . albuterol  (PROVENTIL HFA;VENTOLIN HFA) 108 (90 Base) MCG/ACT inhaler Inhale 1 puff into the lungs every 6 (six) hours as needed for wheezing or shortness of breath.    . cetirizine (ZYRTEC) 10 MG tablet Take 10 mg by mouth daily.    . ergocalciferol (VITAMIN D2) 1.25 MG (50000 UT) capsule Take 50,000 Units by mouth once a week.    . fluticasone (FLONASE) 50 MCG/ACT nasal spray Place 2 sprays into both nostrils daily.    . furosemide (LASIX) 20 MG tablet Take 20 mg by mouth daily as needed for fluid.     . minoxidil (LONITEN) 10 MG tablet Take 1 tablet (10 mg total) by mouth daily. 20 tablet 3  . Multiple Vitamin (MULTIVITAMIN) capsule Take 1 capsule by mouth daily.      Past Medical History:  Diagnosis Date  . Anemia   . Asthma   . Headache(784.0)   . PCOS (polycystic ovarian syndrome)   . Pyelonephritis affecting pregnancy in third trimester, antepartum 08/29/2015  . Seasonal allergies     Past Surgical History:  Procedure Laterality Date  . CESAREAN SECTION    . TONSILLECTOMY    . WISDOM TOOTH EXTRACTION      Social History   Tobacco Use  . Smoking status: Former Smoker    Packs/day: 0.30    Years: 5.00    Pack years: 1.50    Types: Cigarettes    Quit date: 04/10/2015    Years since quitting: 4.4  . Smokeless tobacco: Never Used  Substance Use Topics  .   Alcohol use: Yes    Comment: social  . Drug use: No    Family History  Adopted: Yes  Family history unknown: Yes    PE: Vitals:   09/25/19 1219 09/25/19 1609 09/25/19 1704 09/25/19 1813  BP: (!) 133/98 138/70 (!) 134/103 (!) 150/92  Pulse: (!) 52 (!) 58 69 (!) 57  Resp: 19 16  19   Temp: 98 F (36.7 C)   98 F (36.7 C)  TempSrc: Oral   Oral  SpO2: 99% 100% 99% 100%  Weight:      Height:       Patient appears to be in no acute distress  patient is alert and oriented x3 Atraumatic normocephalic head No cervical or supraclavicular lymphadenopathy appreciated No increased work of breathing, no audible  wheezes/rhonchi Regular sinus rhythm/rate Abdomen is soft, nontender, nondistended, severe left CVA tenderness Lower extremities are symmetric without appreciable edema Grossly neurologically intact No identifiable skin lesions  Recent Labs    09/25/19 1220  WBC 4.2  HGB 10.2*  HCT 32.4*   Recent Labs    09/25/19 1220  NA 139  K 4.3  CL 104  CO2 26  GLUCOSE 97  BUN 11  CREATININE 1.14*  CALCIUM 9.2   No results for input(s): LABPT, INR in the last 72 hours. No results for input(s): LABURIN in the last 72 hours. Results for orders placed or performed during the hospital encounter of 09/25/19  SARS Coronavirus 2 by RT PCR (hospital order, performed in [facility] hospital lab) Nasopharyngeal Nasopharyngeal Swab     Status: None   Collection Time: 09/25/19  5:19 PM   Specimen: Nasopharyngeal Swab  Result Value Ref Range Status   SARS Coronavirus 2 NEGATIVE NEGATIVE Final    Comment: (NOTE) SARS-CoV-2 target nucleic acids are NOT DETECTED.  The SARS-CoV-2 RNA is generally detectable in upper and lower respiratory specimens during the acute phase of infection. The lowest concentration of SARS-CoV-2 viral copies this assay can detect is 250 copies / mL. A negative result does not preclude SARS-CoV-2 infection and should not be used as the sole basis for treatment or other patient management decisions.  A negative result may occur with improper specimen collection / handling, submission of specimen other than nasopharyngeal swab, presence of viral mutation(s) within the areas targeted by this assay, and inadequate number of viral copies (<250 copies / mL). A negative result must be combined with clinical observations, patient history, and epidemiological information.  Fact Sheet for Patients:   11/26/19  Fact Sheet for Healthcare Providers: BoilerBrush.com.cy  This test is not yet approved or  cleared by the  https://pope.com/ FDA and has been authorized for detection and/or diagnosis of SARS-CoV-2 by FDA under an Emergency Use Authorization (EUA).  This EUA will remain in effect (meaning this test can be used) for the duration of the COVID-19 declaration under Section 564(b)(1) of the Act, 21 U.S.C. section 360bbb-3(b)(1), unless the authorization is terminated or revoked sooner.  Performed at [facility], [address] [place], [place]     Imaging: I have independently reviewed the patient's CT scan performed the emergency department which demonstrates a left distal ureteral stone with proximal hydroureteronephrosis, some thickening and enhancement of the urothelium along the ureter as well as atrophic left kidney with some perinephric stranding.  There is also a nonobstructing stone in the lower pole.  Imp: The patient has pain and nausea without hemodynamic instability resulting from a left distal ureteral stone.  On the CT scan she does have some enhancement and some perinephric stranding consistent with a chronic kidney infection.  Recommendations: Rather than take the risk of her developing urosepsis based on the appearance of her kidney today I have opted to proceed to the operating room for stent placement.  I detailed the procedure for the patient and she agreed to proceed.   Crist Fat

## 2019-09-25 NOTE — Transfer of Care (Signed)
Immediate Anesthesia Transfer of Care Note  Patient: [name]  Procedure(s) Performed: CYSTOSCOPY WITH RETROGRADE PYELOGRAM/URETERAL STENT PLACEMENT (Left )  Patient Location: PACU  Anesthesia Type:General  Level of Consciousness: sedated  Airway & Oxygen Therapy: Patient Spontanous Breathing and Patient connected to face mask oxygen  Post-op Assessment: Report given to RN and Post -op Vital signs reviewed and stable  Post vital signs: Reviewed and stable  Last Vitals:  Vitals Value Taken Time  BP    Temp    Pulse 55 09/25/19 2007  Resp 20 09/25/19 2007  SpO2 98 % 09/25/19 2007  Vitals shown include unvalidated device data.  Last Pain:  Vitals:   09/25/19 1856  TempSrc:   PainSc: 3          Complications: No complications documented.

## 2019-09-25 NOTE — ED Provider Notes (Signed)
Care assumed from PA [facility], please see her note for full details, but in brief [name] is a 34 y.o. female is here with persistent nonprogressing left ureteral stone, UA with some signs of infection.  Dr. Marlou Porch with urology consulted, has been down to see the patient, they have elected to admit patient and take her to the OR today for stenting due to concern for developing infection.  Presurgical Covid screening is negative.   Dartha Lodge, PA-C 09/25/19 1910    Tegeler, Canary Brim, MD 09/25/19 2256

## 2019-09-25 NOTE — Anesthesia Procedure Notes (Signed)
Procedure Name: LMA Insertion Date/Time: 09/25/2019 7:31 PM Performed by: Minerva Ends, CRNA Pre-anesthesia Checklist: Patient identified, Emergency Drugs available, Suction available and Patient being monitored Patient Re-evaluated:Patient Re-evaluated prior to induction Oxygen Delivery Method: Circle System Utilized Preoxygenation: Pre-oxygenation with 100% oxygen Induction Type: IV induction Ventilation: Mask ventilation without difficulty LMA: LMA inserted and LMA with gastric port inserted LMA Size: 4.0 Number of attempts: 1 Placement Confirmation: positive ETCO2 Tube secured with: Tape Dental Injury: Teeth and Oropharynx as per pre-operative assessment  Comments: Smooth IV induction Oddono-- LMA insertion AM CRNA -- atraumatic -- teeth and mouth as preop-- bilat BS

## 2019-09-25 NOTE — Consult Note (Signed)
I have been asked to see the patient by Dr. Melene Plan, for evaluation and management of obstructing left ureteral stone with evidence of infection.  History of present illness: 34 year old female who presented to the emergency department with worsening left-sided flank pain.  Her pain began about a month ago and she was seen in the urgent care where she was treated for an infection.  Lots of her symptoms improved, but she is continued to have nausea and abdominal pain.  Pain persisted and progressed over the last few days to the point where the patient needed some more relief and so she presented to the emergency department.  In the emergency department the patient was noted to have normal labs and a urine analysis significant for leukocytes and white blood cells.  A CT scan was performed which demonstrated some thickening of the ureter and some stranding around her left kidney as well as may be atrophy of that left kidney.  In addition, the patient had a stone that was causing obstruction in the left distal ureter as well as this larger nonobstructing stone in the left lower pole.  Her pain was severe, but the patient was otherwise hemodynamically stable.   Review of systems: A 12 point comprehensive review of systems was obtained and is negative unless otherwise stated in the history of present illness.  Patient Active Problem List   Diagnosis Date Noted  . Simple chronic bronchitis (HCC) 06/26/2019  . Nephrolithiasis 10/06/2015  . Previous cesarean section 05/06/2015  . Morbid obesity with BMI of 40.0-44.9, adult (HCC) 05/06/2015  . Asthma 08/05/2011  . Dyspnea 07/07/2011  . Edema 07/07/2011    No current facility-administered medications on file prior to encounter.   Current Outpatient Medications on File Prior to Encounter  Medication Sig Dispense Refill  . acetaminophen (TYLENOL) 325 MG tablet Take 650 mg by mouth every 6 (six) hours as needed for mild pain or headache.    . albuterol  (PROVENTIL HFA;VENTOLIN HFA) 108 (90 Base) MCG/ACT inhaler Inhale 1 puff into the lungs every 6 (six) hours as needed for wheezing or shortness of breath.    . cetirizine (ZYRTEC) 10 MG tablet Take 10 mg by mouth daily.    . ergocalciferol (VITAMIN D2) 1.25 MG (50000 UT) capsule Take 50,000 Units by mouth once a week.    . fluticasone (FLONASE) 50 MCG/ACT nasal spray Place 2 sprays into both nostrils daily.    . furosemide (LASIX) 20 MG tablet Take 20 mg by mouth daily as needed for fluid.     . minoxidil (LONITEN) 10 MG tablet Take 1 tablet (10 mg total) by mouth daily. 20 tablet 3  . Multiple Vitamin (MULTIVITAMIN) capsule Take 1 capsule by mouth daily.      Past Medical History:  Diagnosis Date  . Anemia   . Asthma   . Headache(784.0)   . PCOS (polycystic ovarian syndrome)   . Pyelonephritis affecting pregnancy in third trimester, antepartum 08/29/2015  . Seasonal allergies     Past Surgical History:  Procedure Laterality Date  . CESAREAN SECTION    . TONSILLECTOMY    . WISDOM TOOTH EXTRACTION      Social History   Tobacco Use  . Smoking status: Former Smoker    Packs/day: 0.30    Years: 5.00    Pack years: 1.50    Types: Cigarettes    Quit date: 04/10/2015    Years since quitting: 4.4  . Smokeless tobacco: Never Used  Substance Use Topics  .  Alcohol use: Yes    Comment: social  . Drug use: No    Family History  Adopted: Yes  Family history unknown: Yes    PE: Vitals:   09/25/19 1219 09/25/19 1609 09/25/19 1704 09/25/19 1813  BP: (!) 133/98 138/70 (!) 134/103 (!) 150/92  Pulse: (!) 52 (!) 58 69 (!) 57  Resp: 19 16  19   Temp: 98 F (36.7 C)   98 F (36.7 C)  TempSrc: Oral   Oral  SpO2: 99% 100% 99% 100%  Weight:      Height:       Patient appears to be in no acute distress  patient is alert and oriented x3 Atraumatic normocephalic head No cervical or supraclavicular lymphadenopathy appreciated No increased work of breathing, no audible  wheezes/rhonchi Regular sinus rhythm/rate Abdomen is soft, nontender, nondistended, severe left CVA tenderness Lower extremities are symmetric without appreciable edema Grossly neurologically intact No identifiable skin lesions  Recent Labs    09/25/19 1220  WBC 4.2  HGB 10.2*  HCT 32.4*   Recent Labs    09/25/19 1220  NA 139  K 4.3  CL 104  CO2 26  GLUCOSE 97  BUN 11  CREATININE 1.14*  CALCIUM 9.2   No results for input(s): LABPT, INR in the last 72 hours. No results for input(s): LABURIN in the last 72 hours. Results for orders placed or performed during the hospital encounter of 09/25/19  SARS Coronavirus 2 by RT PCR (hospital order, performed in [facility] hospital lab) Nasopharyngeal Nasopharyngeal Swab     Status: None   Collection Time: 09/25/19  5:19 PM   Specimen: Nasopharyngeal Swab  Result Value Ref Range Status   SARS Coronavirus 2 NEGATIVE NEGATIVE Final    Comment: (NOTE) SARS-CoV-2 target nucleic acids are NOT DETECTED.  The SARS-CoV-2 RNA is generally detectable in upper and lower respiratory specimens during the acute phase of infection. The lowest concentration of SARS-CoV-2 viral copies this assay can detect is 250 copies / mL. A negative result does not preclude SARS-CoV-2 infection and should not be used as the sole basis for treatment or other patient management decisions.  A negative result may occur with improper specimen collection / handling, submission of specimen other than nasopharyngeal swab, presence of viral mutation(s) within the areas targeted by this assay, and inadequate number of viral copies (<250 copies / mL). A negative result must be combined with clinical observations, patient history, and epidemiological information.  Fact Sheet for Patients:   11/26/19  Fact Sheet for Healthcare Providers: BoilerBrush.com.cy  This test is not yet approved or  cleared by the  https://pope.com/ FDA and has been authorized for detection and/or diagnosis of SARS-CoV-2 by FDA under an Emergency Use Authorization (EUA).  This EUA will remain in effect (meaning this test can be used) for the duration of the COVID-19 declaration under Section 564(b)(1) of the Act, 21 U.S.C. section 360bbb-3(b)(1), unless the authorization is terminated or revoked sooner.  Performed at [facility], [address] [place], [place]     Imaging: I have independently reviewed the patient's CT scan performed the emergency department which demonstrates a left distal ureteral stone with proximal hydroureteronephrosis, some thickening and enhancement of the urothelium along the ureter as well as atrophic left kidney with some perinephric stranding.  There is also a nonobstructing stone in the lower pole.  Imp: The patient has pain and nausea without hemodynamic instability resulting from a left distal ureteral stone.  On the CT scan she does have some enhancement and some perinephric stranding consistent with a chronic kidney infection.  Recommendations: Rather than take the risk of her developing urosepsis based on the appearance of her kidney today I have opted to proceed to the operating room for stent placement.  I detailed the procedure for the patient and she agreed to proceed.   Crist Fat

## 2019-09-25 NOTE — Discharge Instructions (Signed)
DISCHARGE INSTRUCTIONS FOR KIDNEY STONE/URETERAL STENT   MEDICATIONS:  1.  Resume all your other meds from home - except do not take any extra narcotic pain meds that you may have at home.  2. Pyridium is to help with the burning/stinging when you urinate. 3. Tramadol is for moderate/severe pain, otherwise taking upto 1000 mg every 6 hours of plainTylenol will help treat your pain.     ACTIVITY:  1. No strenuous activity x 1week  2. No driving while on narcotic pain medications  3. Drink plenty of water  4. Continue to walk at home - you can still get blood clots when you are at home, so keep active, but don't over do it.  5. May return to work/school tomorrow or when you feel ready   BATHING:  1. You can shower and we recommend daily showers  2. You have a string coming from your urethra: The stent string is attached to your ureteral stent. Do not pull on this.   SIGNS/SYMPTOMS TO CALL:  Please call us if you have a fever greater than 101.5, uncontrolled nausea/vomiting, uncontrolled pain, dizziness, unable to urinate, bloody urine, chest pain, shortness of breath, leg swelling, leg pain, redness around wound, drainage from wound, or any other concerns or questions.   You can reach [place] at [phone].   FOLLOW-UP:  1.  We will schedule you for f/u in 2 weeks to have your stones removed.   General Anesthesia, Adult, Care After This sheet gives you information about how to care for yourself after your procedure. Your health care provider may also give you more specific instructions. If you have problems or questions, contact your health care provider. What can I expect after the procedure? After the procedure, the following side effects are common:  Pain or discomfort at the IV site.  Nausea.  Vomiting.  Sore throat.  Trouble concentrating.  Feeling cold or chills.  Weak or tired.  Sleepiness and fatigue.  Soreness and body aches. These side effects can affect parts of  the body that were not involved in surgery. Follow these instructions at home:  For at least 24 hours after the procedure:  Have a responsible adult stay with you. It is important to have someone help care for you until you are awake and alert.  Rest as needed.  Do not: ? Participate in activities in which you could fall or become injured. ? Drive. ? Use heavy machinery. ? Drink alcohol. ? Take sleeping pills or medicines that cause drowsiness. ? Make important decisions or sign legal documents. ? Take care of children on your own. Eating and drinking  Follow any instructions from your health care provider about eating or drinking restrictions.  When you feel hungry, start by eating small amounts of foods that are soft and easy to digest (bland), such as toast. Gradually return to your regular diet.  Drink enough fluid to keep your urine pale yellow.  If you vomit, rehydrate by drinking water, juice, or clear broth. General instructions  If you have sleep apnea, surgery and certain medicines can increase your risk for breathing problems. Follow instructions from your health care provider about wearing your sleep device: ? Anytime you are sleeping, including during daytime naps. ? While taking prescription pain medicines, sleeping medicines, or medicines that make you drowsy.  Return to your normal activities as told by your health care provider. Ask your health care provider what activities are safe for you.  Take over-the-counter and prescription  medicines only as told by your health care provider.  If you smoke, do not smoke without supervision.  Keep all follow-up visits as told by your health care provider. This is important. Contact a health care provider if:  You have nausea or vomiting that does not get better with medicine.  You cannot eat or drink without vomiting.  You have pain that does not get better with medicine.  You are unable to pass urine.  You  develop a skin rash.  You have a fever.  You have redness around your IV site that gets worse. Get help right away if:  You have difficulty breathing.  You have chest pain.  You have blood in your urine or stool, or you vomit blood. Summary  After the procedure, it is common to have a sore throat or nausea. It is also common to feel tired.  Have a responsible adult stay with you for the first 24 hours after general anesthesia. It is important to have someone help care for you until you are awake and alert.  When you feel hungry, start by eating small amounts of foods that are soft and easy to digest (bland), such as toast. Gradually return to your regular diet.  Drink enough fluid to keep your urine pale yellow.  Return to your normal activities as told by your health care provider. Ask your health care provider what activities are safe for you. This information is not intended to replace advice given to you by your health care provider. Make sure you discuss any questions you have with your health care provider. Document Revised: 03/11/2017 Document Reviewed: 10/22/2016 Elsevier Patient Education  2020 [employer].

## 2019-09-25 NOTE — ED Provider Notes (Signed)
[facility]-EMERGENCY DEPT Provider Note   CSN: [id number] Arrival date & time: 09/25/19  1132     History Chief Complaint  Patient presents with  . Abdominal Pain    [name] is a 34 y.o. female with a past medical history of PCOS, hidradenitis, who presents today for evaluation of worsening of her chronic abdominal pain.  She was seen in urgent care earlier today with concern for [facility] cystitis and referred here.  She reports that she had worsening right upper quadrant abdominal pain.  She states that this worsens with any fatty or fried foods.  She states that this is causing her to lose weight.  Initially her symptoms were able to be controlled with Zofran however now they are unable to be controlled with Zofran and she vomits up any Phenergan.  She has chills however denies fevers.  She has recently been on cefdinir, doxycycline, and Keflex for hidradenitis and a UTI however denies any changes in her symptoms while taking antibiotics.  She reports her pain is in the RUQ, worst however has chronic pain through her entire abdomen.   She reports continued urinary frequency.  She states that she is also still having spasming waxing and waning pain only left side of her abdomen.  HPI     Past Medical History:  Diagnosis Date  . Anemia   . Asthma   . Headache(784.0)   . PCOS (polycystic ovarian syndrome)   . Pyelonephritis affecting pregnancy in third trimester, antepartum 08/29/2015  . Seasonal allergies     Patient Active Problem List   Diagnosis Date Noted  . Simple chronic bronchitis (HCC) 06/26/2019  . Nephrolithiasis 10/06/2015  . Previous cesarean section 05/06/2015  . Morbid obesity with BMI of 40.0-44.9, adult (HCC) 05/06/2015  . Asthma 08/05/2011  . Dyspnea 07/07/2011  . Edema 07/07/2011    Past Surgical History:  Procedure Laterality Date  . CESAREAN SECTION    . TONSILLECTOMY    . WISDOM TOOTH EXTRACTION       OB History     Gravida  3   Para  3   Term  3   Preterm  0   AB  0   Living  3     SAB  0   TAB  0   Ectopic  0   Multiple  0   Live Births  3           Family History  Adopted: Yes  Family history unknown: Yes    Social History   Tobacco Use  . Smoking status: Former Smoker    Packs/day: 0.30    Years: 5.00    Pack years: 1.50    Types: Cigarettes    Quit date: 04/10/2015    Years since quitting: 4.4  . Smokeless tobacco: Never Used  Substance Use Topics  . Alcohol use: Yes    Comment: social  . Drug use: No    Home Medications Prior to Admission medications   Medication Sig Start Date End Date Taking? Authorizing Provider  acetaminophen (TYLENOL) 325 MG tablet Take 650 mg by mouth every 6 (six) hours as needed for mild pain or headache.   Yes [provider]  albuterol (PROVENTIL HFA;VENTOLIN HFA) 108 (90 Base) MCG/ACT inhaler Inhale 1 puff into the lungs every 6 (six) hours as needed for wheezing or shortness of breath.   Yes [provider]  cetirizine (ZYRTEC) 10 MG tablet Take 10 mg by mouth daily.  Yes [provider]  ergocalciferol (VITAMIN D2) 1.25 MG (50000 UT) capsule Take 50,000 Units by mouth once a week.   Yes [provider]  fluticasone (FLONASE) 50 MCG/ACT nasal spray Place 2 sprays into both nostrils daily.   Yes [provider]  furosemide (LASIX) 20 MG tablet Take 20 mg by mouth daily as needed for fluid.    Yes [provider]  minoxidil (LONITEN) 10 MG tablet Take 1 tablet (10 mg total) by mouth daily. 06/26/19  Yes Malachy Chamber, MD  Multiple Vitamin (MULTIVITAMIN) capsule Take 1 capsule by mouth daily.   Yes [provider]    Allergies    Pepto-bismol [bismuth] and Earnestine Leys officinalis]  Review of Systems   Review of Systems  Constitutional: Positive for chills. Negative for fever.  Respiratory: Negative for chest tightness and shortness of breath.   Cardiovascular:  Negative for chest pain.  Gastrointestinal: Positive for abdominal pain, nausea and vomiting. Negative for constipation and diarrhea.  Genitourinary: Negative for dysuria and urgency.  Musculoskeletal: Negative for back pain and neck pain.  Skin: Negative for color change, rash and wound.  Neurological: Negative for weakness and headaches.  Psychiatric/Behavioral: Negative for confusion.  All other systems reviewed and are negative.   Physical Exam Updated Vital Signs BP 138/70   Pulse (!) 58   Temp 98 F (36.7 C) (Oral)   Resp 16   Ht 5' 3.5" (1.613 m)   Wt 105.7 kg   SpO2 100%   BMI 40.63 kg/m   Physical Exam Vitals and nursing note reviewed.  Constitutional:      General: She is not in acute distress.    Appearance: She is well-developed. She is obese. She is not diaphoretic.  HENT:     Head: Normocephalic and atraumatic.  Eyes:     General: No scleral icterus.       Right eye: No discharge.        Left eye: No discharge.     Conjunctiva/sclera: Conjunctivae normal.  Cardiovascular:     Rate and Rhythm: Normal rate and regular rhythm.  Pulmonary:     Effort: Pulmonary effort is normal. No respiratory distress.     Breath sounds: No stridor.  Abdominal:     General: There is no distension.     Palpations: Abdomen is soft.     Tenderness: There is generalized abdominal tenderness (Diffusely TTP, worse in RUQ with involuntary guarding. ). There is left CVA tenderness. There is no right CVA tenderness.  Musculoskeletal:        General: No deformity.     Cervical back: Normal range of motion.  Skin:    General: Skin is warm and dry.  Neurological:     General: No focal deficit present.     Mental Status: She is alert.     Motor: No abnormal muscle tone.  Psychiatric:        Mood and Affect: Mood normal.        Behavior: Behavior normal.     ED Results / Procedures / Treatments   Labs (all labs ordered are listed, but only abnormal results are  displayed) Labs Reviewed  COMPREHENSIVE METABOLIC PANEL - Abnormal; Notable for the following components:      Result Value   Creatinine, Ser 1.14 (*)    All other components within normal limits  CBC - Abnormal; Notable for the following components:   RBC 3.83 (*)    Hemoglobin 10.2 (*)  HCT 32.4 (*)    All other components within normal limits  URINALYSIS, ROUTINE W REFLEX MICROSCOPIC - Abnormal; Notable for the following components:   APPearance HAZY (*)    Hgb urine dipstick LARGE (*)    Protein, ur 100 (*)    Leukocytes,Ua TRACE (*)    Bacteria, UA RARE (*)    All other components within normal limits  URINE CULTURE  LIPASE, BLOOD  I-STAT BETA HCG BLOOD, ED ([facility], [facility], [facility] ONLY)    EKG None  Radiology CT Abdomen Pelvis W Contrast  Result Date: 09/25/2019 CLINICAL DATA:  Abdominal pain with nausea and vomiting EXAM: CT ABDOMEN AND PELVIS WITH CONTRAST TECHNIQUE: Multidetector CT imaging of the abdomen and pelvis was performed using the standard protocol following bolus administration of intravenous contrast. CONTRAST:  OMNIPAQUE IOHEXOL 300 MG/ML  SOLN COMPARISON:  Ultrasound right upper quadrant September 25, 2019; June 18, 2008 CT abdomen and pelvis FINDINGS: Lower chest: Lung bases are clear. Hepatobiliary: A 7 mm echogenic focus is seen in the anterior segment of the right lobe of the liver, a potential small hemangioma. No other focal liver lesions are appreciable. Liver measures 23.7 cm in length. The gallbladder wall is not appreciably thickened. There is no biliary duct dilatation. Pancreas: There is no pancreatic mass or inflammatory focus. Spleen: Spleen measures 15.2 x 13.7 x 7.6 cm with a measured splenic volume of 791 cubic cm. No focal splenic lesions are evident. Adrenals/Urinary Tract: Adrenals appear normal. The left kidney is edematous. There is no well-defined renal mass on the left. There is hydronephrosis on the left with enhancement of the collecting system and  ureter. There is an intrarenal calculus in the lower pole of the left kidney measuring 1.1 x 0.8 cm. A nearby 2 mm calculus is seen in the lower pole left kidney. There is no renal calculus evident on the right. There is a calculus in the distal left ureter slightly superior to the left acetabulum measuring 0.9 x 0.6 cm. No other ureteral calculi are evident. Urinary bladder is midline with wall thickness within normal limits. Stomach/Bowel: There is no appreciable bowel wall or mesenteric thickening. There is no evident bowel obstruction. The terminal ileum appears normal. There is no evident free air or portal venous air. Vascular/Lymphatic: No abdominal aortic aneurysm. No arterial vascular lesions are evident. Major venous structures appear patent. There is retroperitoneal adenopathy in the left perinephric region. Largest individual lymph node in this area measures 1.9 x 1.7 cm. No other appreciable adenopathy is evident. Reproductive: Uterus is anteverted. Intrauterine device is positioned inferiorly within the uterus with a portion of the intrauterine device within the cervix. No pelvic mass evident. Other: Appendix appears normal. No evident abscess or ascites in the abdomen or pelvis. There is slight fat in the umbilicus. Musculoskeletal: No blastic or lytic bone lesions. No intramuscular lesions evident. IMPRESSION: 1. There is a 9 x 6 mm distal left ureteral calculus. There is hydronephrosis on the left as well as left renal edema. There is enhancement throughout the course of the left ureter as well as in the left collecting system. 2. Nonobstructing calculi lower pole left kidney, largest measuring 1.1 x 0.8 cm. 3.  Splenomegaly. 4. Retroperitoneal adenopathy in the left perirenal region. Adenopathy coupled with small megaly raises concern for potential lymphoma. Appropriate clinical and laboratory assessment in this regard warranted. 5. Intrauterine device is inferiorly positioned in the lower uterine  segment and cervix. Gynecologic assessment in this regard advised. 6. Prominent  liver. Probable small hemangioma in the right lobe of the liver, better seen ultrasound performed earlier in the day. 7. No bowel obstruction. No abscess in the abdomen or pelvis. Appendix appears normal. Electronically Signed   By: Bretta BangWilliam  Woodruff III M.D.   On: 09/25/2019 14:51   US Abdomen Limited RUQ  Result Date: 09/25/2019 CLINICAL DATA:  Right upper quadrant abdominal pain for several months. EXAM: ULTRASOUND ABDOMEN LIMITED RIGHT UPPER QUADRANT COMPARISON:  June 19, 2008. FINDINGS: Gallbladder: No gallstones or wall thickening visualized. No sonographic Murphy sign noted by sonographer. Common bile duct: Diameter: 6 mm which is within normal limits. Liver: 11 mm echogenic focus is noted in right hepatic lobe. Within normal limits in parenchymal echogenicity. Portal vein is patent on color Doppler imaging with normal direction of blood flow towards the liver. Other: None. IMPRESSION: 11 mm echogenic focus is noted in right hepatic lobe which is most consistent with benign hemangioma in the absence of any history of malignancy. Follow-up ultrasound in 6-12 months is recommended to ensure stability. The patient does have a history of malignancy, then MRI is recommended evaluate for possible metastatic disease. No other abnormality seen in the right upper quadrant of the abdomen. Electronically Signed   By: Lupita RaiderJames  Green Jr M.D.   On: 09/25/2019 13:17    Procedures Procedures (including critical care time)  Medications Ordered in ED Medications  sodium chloride (PF) 0.9 % injection (has no administration in time range)  morphine 4 MG/ML injection 4 mg (has no administration in time range)  sodium chloride flush (NS) 0.9 % injection 3 mL (3 mLs Intravenous Given 09/25/19 1234)  promethazine (PHENERGAN) injection 12.5 mg (12.5 mg Intravenous Given 09/25/19 1234)  lactated ringers bolus 1,000 mL (0 mLs Intravenous Stopped  09/25/19 1607)  promethazine (PHENERGAN) injection 12.5 mg (12.5 mg Intravenous Given 09/25/19 1615)  iohexol (OMNIPAQUE) 300 MG/ML solution 100 mL (100 mLs Intravenous Contrast Given 09/25/19 1409)    ED Course  I have reviewed the triage vital signs and the nursing notes.  Pertinent labs & imaging results that were available during my care of the patient were reviewed by me and considered in my medical decision making (see chart for details).  Clinical Course as of Sep 25 1626  Tue Sep 25, 2019  1623 Spoke with Dr. Marlou PorchHerrick, patient has elected for shockwave lithotripsy on Thursday after options were discussed.     [EH]    Clinical Course User Index [EH] Norman ClayHammond, Kimmberly Wisser W, PA-C   MDM Rules/Calculators/A&P                         Patient is a 34 year old woman who presents today for evaluation of acute on chronic abdominal pain.  Specifically over the past few weeks she has had worsening pain and new worsening of pain in her left abdomen.  She was seen at urgent care and sent here for concern of cholecystitis.  Labs are obtained and reviewed, CMP is unremarkable.  CBC shows mild anemia at 10.2.  UA is concerning with 11-20 red cells, 21-50 white cells and rare bacteria with only 6-10 squamous epithelial cells.  Right upper quadrant ultrasound was obtained without evidence of cholecystitis.  IV fluids, Phenergan, pain medicine are ordered.  CT scan was obtained showing a large obstructing left ureteral stone with lymph node enlargement around the left kidney arising concerning for lymphoma.  I spoke with Dr. Marlou PorchHerrick on-call for urology.  Patient is leaning towards shockwave  lithotripsy for treatment however he will be by in about an hour in order to discuss this with the patient.  At shift change care was transferred to [facility] who will follow pending studies, re-evaulate and determine disposition.     Note: Portions of this report may have been transcribed using voice recognition  software. Every effort was made to ensure accuracy; however, inadvertent computerized transcription errors may be present  Final Clinical Impression(s) / ED Diagnoses Final diagnoses:  Abdominal pain, acute, right upper quadrant  Left ureteral stone    Rx / DC Orders ED Discharge Orders    None       Cristina Gong, PA-C 09/25/19 1631    Melene Plan, DO 09/29/19 [phone]

## 2019-09-25 NOTE — Anesthesia Procedure Notes (Signed)
Date/Time: 09/25/2019 7:59 PM Performed by: Minerva Ends, CRNA Oxygen Delivery Method: Simple face mask Placement Confirmation: positive ETCO2 and breath sounds checked- equal and bilateral Dental Injury: Teeth and Oropharynx as per pre-operative assessment

## 2019-09-26 ENCOUNTER — Encounter (HOSPITAL_COMMUNITY): Payer: Self-pay | Admitting: Urology

## 2019-09-26 NOTE — Anesthesia Postprocedure Evaluation (Signed)
Anesthesia Post Note  Patient: [name]  Procedure(s) Performed: CYSTOSCOPY WITH RETROGRADE PYELOGRAM/URETERAL STENT PLACEMENT (Left )     Patient location during evaluation: PACU Anesthesia Type: General Level of consciousness: awake and alert Pain management: pain level controlled Vital Signs Assessment: post-procedure vital signs reviewed and stable Respiratory status: spontaneous breathing, nonlabored ventilation, respiratory function stable and patient connected to nasal cannula oxygen Cardiovascular status: blood pressure returned to baseline and stable Postop Assessment: no apparent nausea or vomiting Anesthetic complications: no   No complications documented.  Last Vitals:  Vitals:   09/25/19 2125 09/25/19 2130  BP:  135/89  Pulse: 62 (!) 59  Resp:  19  Temp:  36.8 C  SpO2: 98% 98%    Last Pain:  Vitals:   09/25/19 2130  TempSrc:   PainSc: 0-No pain   Pain Goal: Patients Stated Pain Goal: 4 (09/25/19 2100)                 Marsena Taff

## 2019-09-28 ENCOUNTER — Other Ambulatory Visit: Payer: Self-pay | Admitting: Urology

## 2019-09-28 LAB — URINE CULTURE: Culture: >=100000 — AB

## 2019-10-02 ENCOUNTER — Other Ambulatory Visit: Payer: Self-pay

## 2019-10-02 ENCOUNTER — Encounter (HOSPITAL_BASED_OUTPATIENT_CLINIC_OR_DEPARTMENT_OTHER): Payer: Self-pay | Admitting: Urology

## 2019-10-02 NOTE — Progress Notes (Signed)
NEW Covid Policy July 2021  Surgery Day:   10-11-2019  Facility:  [facility]  Type of Surgery: Cysto/ Ureteroscopic Laser Lithtripsy  Have you had Covid vaccine? NO   In the past 14 days:        Have you had any symptoms?  NO       Have you been tested covid positive? NO       Have you been in contact with someone covid positive? NO       Have you traveled internationally? NO        Is pt Immuno-compromised? NO

## 2019-10-02 NOTE — Progress Notes (Signed)
Spoke w/ via phone for pre-op interview--- PT Lab needs dos----  Urine preg             Lab results------ no COVID test ------ 10-08-2019 @ 1300 Not vaccinated Arrive at ------- 0530 NPO after MN  Medications to take morning of surgery ----- Flonase spray,  If needed may take tramadol w/ sips of water Diabetic medication ----- n/a Patient Special Instructions ----- asked to bring rescue inhaler Pre-Op special Istructions ----- n/a Patient verbalized understanding of instructions that were given at this phone interview. Patient denies shortness of breath, chest pain, fever, cough a this phone interview.

## 2019-10-08 ENCOUNTER — Other Ambulatory Visit (HOSPITAL_COMMUNITY)
Admission: RE | Admit: 2019-10-08 | Discharge: 2019-10-08 | Disposition: A | Discharge status: Home / Self Care | Payer: Medicaid Other | Source: Ambulatory Visit | Attending: Urology | Admitting: Urology

## 2019-10-08 DIAGNOSIS — Z01812 Encounter for preprocedural laboratory examination: Secondary | ICD-10-CM | POA: Diagnosis not present

## 2019-10-08 DIAGNOSIS — Z20822 Contact with and (suspected) exposure to covid-19: Secondary | ICD-10-CM | POA: Diagnosis not present

## 2019-10-08 LAB — SARS CORONAVIRUS 2 (TAT 6-24 HRS): SARS Coronavirus 2: NEGATIVE

## 2019-10-10 ENCOUNTER — Other Ambulatory Visit: Payer: Self-pay | Admitting: Urology

## 2019-10-10 NOTE — Anesthesia Preprocedure Evaluation (Addendum)
Anesthesia Evaluation  Patient identified by MRN, date of birth, ID band Patient awake    Reviewed: Allergy & Precautions, H&P , NPO status , Patient's Chart, lab work & pertinent test results, reviewed documented beta blocker date and time   Airway Mallampati: I  TM Distance: >3 FB Neck ROM: Full    Dental no notable dental hx. (+) Teeth Intact, Dental Advisory Given   Pulmonary shortness of breath, asthma , former smoker,    Pulmonary exam normal breath sounds clear to auscultation       Cardiovascular Exercise Tolerance: Good hypertension, Pt. on medications Normal cardiovascular exam Rhythm:Regular Rate:Normal     Neuro/Psych  Headaches,    GI/Hepatic   Endo/Other  Morbid obesity  Renal/GU Renal disease     Musculoskeletal   Abdominal (+) + obese,   Peds  Hematology  (+) Blood dyscrasia, anemia ,   Anesthesia Other Findings   Reproductive/Obstetrics                            Anesthesia Physical  Anesthesia Plan  ASA: III  Anesthesia Plan: General   Post-op Pain Management:    Induction: Intravenous  PONV Risk Score and Plan: 3 and Ondansetron, Dexamethasone and Treatment may vary due to age or medical condition  Airway Management Planned: LMA and Oral ETT  Additional Equipment: None  Intra-op Plan:   Post-operative Plan:   Informed Consent: I have reviewed the patients History and Physical, chart, labs and discussed the procedure including the risks, benefits and alternatives for the proposed anesthesia with the patient or authorized representative who has indicated his/her understanding and acceptance.     Dental Advisory Given  Plan Discussed with: CRNA and Anesthesiologist  Anesthesia Plan Comments: ( )       Anesthesia Quick Evaluation

## 2019-10-11 ENCOUNTER — Ambulatory Visit (HOSPITAL_BASED_OUTPATIENT_CLINIC_OR_DEPARTMENT_OTHER)
Admission: RE | Admit: 2019-10-11 | Discharge: 2019-10-11 | Disposition: A | Discharge status: Home / Self Care | Payer: Medicaid Other | Attending: Urology | Admitting: Urology

## 2019-10-11 ENCOUNTER — Other Ambulatory Visit: Payer: Self-pay

## 2019-10-11 ENCOUNTER — Ambulatory Visit (HOSPITAL_BASED_OUTPATIENT_CLINIC_OR_DEPARTMENT_OTHER): Payer: Medicaid Other | Admitting: Anesthesiology

## 2019-10-11 ENCOUNTER — Encounter (HOSPITAL_BASED_OUTPATIENT_CLINIC_OR_DEPARTMENT_OTHER): Payer: Self-pay | Admitting: Urology

## 2019-10-11 ENCOUNTER — Encounter (HOSPITAL_BASED_OUTPATIENT_CLINIC_OR_DEPARTMENT_OTHER)
Admission: RE | Disposition: A | Discharge status: Home / Self Care | Payer: Self-pay | Source: Home / Self Care | Attending: Urology

## 2019-10-11 DIAGNOSIS — Z87442 Personal history of urinary calculi: Secondary | ICD-10-CM | POA: Insufficient documentation

## 2019-10-11 DIAGNOSIS — Z6841 Body Mass Index (BMI) 40.0 and over, adult: Secondary | ICD-10-CM | POA: Diagnosis not present

## 2019-10-11 DIAGNOSIS — E282 Polycystic ovarian syndrome: Secondary | ICD-10-CM | POA: Insufficient documentation

## 2019-10-11 DIAGNOSIS — I10 Essential (primary) hypertension: Secondary | ICD-10-CM | POA: Insufficient documentation

## 2019-10-11 DIAGNOSIS — J45909 Unspecified asthma, uncomplicated: Secondary | ICD-10-CM | POA: Diagnosis not present

## 2019-10-11 DIAGNOSIS — Z20822 Contact with and (suspected) exposure to covid-19: Secondary | ICD-10-CM | POA: Diagnosis not present

## 2019-10-11 DIAGNOSIS — N201 Calculus of ureter: Secondary | ICD-10-CM | POA: Insufficient documentation

## 2019-10-11 DIAGNOSIS — Z87891 Personal history of nicotine dependence: Secondary | ICD-10-CM | POA: Diagnosis not present

## 2019-10-11 DIAGNOSIS — Z79899 Other long term (current) drug therapy: Secondary | ICD-10-CM | POA: Insufficient documentation

## 2019-10-11 DIAGNOSIS — N200 Calculus of kidney: Secondary | ICD-10-CM

## 2019-10-11 HISTORY — DX: Personal history of urinary calculi: Z87.442

## 2019-10-11 HISTORY — DX: Unspecified asthma, uncomplicated: J45.909

## 2019-10-11 HISTORY — DX: Personal history of other diseases of urinary system: Z87.448

## 2019-10-11 HISTORY — DX: Asymptomatic varicose veins of unspecified lower extremity: I83.90

## 2019-10-11 HISTORY — PX: CYSTOSCOPY/URETEROSCOPY/HOLMIUM LASER/STENT PLACEMENT: SHX6546

## 2019-10-11 HISTORY — DX: Calculus of kidney: N20.0

## 2019-10-11 HISTORY — DX: Migraine, unspecified, not intractable, without status migrainosus: G43.909

## 2019-10-11 HISTORY — DX: Personal history of traumatic brain injury: Z87.820

## 2019-10-11 HISTORY — DX: Calculus of ureter: N20.1

## 2019-10-11 HISTORY — DX: Nonscarring hair loss, unspecified: L65.9

## 2019-10-11 LAB — POCT PREGNANCY, URINE: Preg Test, Ur: NEGATIVE

## 2019-10-11 SURGERY — CYSTOSCOPY/URETEROSCOPY/HOLMIUM LASER/STENT PLACEMENT
Anesthesia: General | Site: Ureter | Laterality: Left

## 2019-10-11 MED ORDER — ACETAMINOPHEN 160 MG/5ML PO SOLN: 325.0000 mg | ORAL | Status: DC | PRN

## 2019-10-11 MED ORDER — FENTANYL CITRATE (PF) 250 MCG/5ML IJ SOLN
INTRAMUSCULAR | Status: AC
  Filled 2019-10-11: qty 5

## 2019-10-11 MED ORDER — DEXAMETHASONE SODIUM PHOSPHATE 10 MG/ML IJ SOLN
INTRAMUSCULAR | Status: DC | PRN
  Administered 2019-10-11: 10 mg via INTRAVENOUS

## 2019-10-11 MED ORDER — ONDANSETRON HCL 4 MG/2ML IJ SOLN: 4.0000 mg | Freq: Once | INTRAMUSCULAR | Status: DC | PRN

## 2019-10-11 MED ORDER — PROPOFOL 10 MG/ML IV BOLUS
INTRAVENOUS | Status: DC | PRN
  Administered 2019-10-11: 160 mg via INTRAVENOUS

## 2019-10-11 MED ORDER — CIPROFLOXACIN IN D5W 400 MG/200ML IV SOLN
400.0000 mg | INTRAVENOUS | Status: AC
  Administered 2019-10-11: 400 mg via INTRAVENOUS

## 2019-10-11 MED ORDER — FENTANYL CITRATE (PF) 100 MCG/2ML IJ SOLN
INTRAMUSCULAR | Status: DC | PRN
  Administered 2019-10-11: 25 ug via INTRAVENOUS
  Administered 2019-10-11 (×3): 50 ug via INTRAVENOUS
  Administered 2019-10-11: 25 ug via INTRAVENOUS
  Administered 2019-10-11: 50 ug via INTRAVENOUS

## 2019-10-11 MED ORDER — PHENYLEPHRINE 40 MCG/ML (10ML) SYRINGE FOR IV PUSH (FOR BLOOD PRESSURE SUPPORT)
PREFILLED_SYRINGE | INTRAVENOUS | Status: DC | PRN
  Administered 2019-10-11: 40 ug via INTRAVENOUS
  Administered 2019-10-11 (×3): 80 ug via INTRAVENOUS
  Administered 2019-10-11: 40 ug via INTRAVENOUS

## 2019-10-11 MED ORDER — ONDANSETRON HCL 4 MG/2ML IJ SOLN
INTRAMUSCULAR | Status: DC | PRN
  Administered 2019-10-11: 4 mg via INTRAVENOUS

## 2019-10-11 MED ORDER — SUCCINYLCHOLINE CHLORIDE 20 MG/ML IJ SOLN
INTRAMUSCULAR | Status: DC | PRN
  Administered 2019-10-11: 160 mg via INTRAVENOUS

## 2019-10-11 MED ORDER — OXYCODONE HCL 5 MG/5ML PO SOLN: 5.0000 mg | Freq: Once | ORAL | Status: AC | PRN

## 2019-10-11 MED ORDER — MIDAZOLAM HCL 5 MG/5ML IJ SOLN
INTRAMUSCULAR | Status: DC | PRN
  Administered 2019-10-11: 2 mg via INTRAVENOUS

## 2019-10-11 MED ORDER — PROPOFOL 10 MG/ML IV BOLUS
INTRAVENOUS | Status: AC
  Filled 2019-10-11: qty 20

## 2019-10-11 MED ORDER — LACTATED RINGERS IV SOLN: INTRAVENOUS | Status: DC

## 2019-10-11 MED ORDER — MIDAZOLAM HCL 2 MG/2ML IJ SOLN
INTRAMUSCULAR | Status: AC
  Filled 2019-10-11: qty 2

## 2019-10-11 MED ORDER — ROCURONIUM BROMIDE 100 MG/10ML IV SOLN
INTRAVENOUS | Status: DC | PRN
Start: 2019-10-11 — End: 2019-10-11
  Administered 2019-10-11: 10 mg via INTRAVENOUS
  Administered 2019-10-11: 30 mg via INTRAVENOUS

## 2019-10-11 MED ORDER — SODIUM CHLORIDE 0.9 % IR SOLN
Status: DC | PRN
  Administered 2019-10-11: 6000 mL via INTRAVESICAL

## 2019-10-11 MED ORDER — OXYCODONE HCL 5 MG PO TABS
ORAL_TABLET | ORAL | Status: AC
  Filled 2019-10-11: qty 1

## 2019-10-11 MED ORDER — SUGAMMADEX SODIUM 200 MG/2ML IV SOLN
INTRAVENOUS | Status: DC | PRN
  Administered 2019-10-11: 200 mg via INTRAVENOUS

## 2019-10-11 MED ORDER — PHENYLEPHRINE 40 MCG/ML (10ML) SYRINGE FOR IV PUSH (FOR BLOOD PRESSURE SUPPORT)
PREFILLED_SYRINGE | INTRAVENOUS | Status: AC
  Filled 2019-10-11: qty 10

## 2019-10-11 MED ORDER — ACETAMINOPHEN 325 MG PO TABS: 325.0000 mg | ORAL_TABLET | ORAL | Status: DC | PRN

## 2019-10-11 MED ORDER — LIDOCAINE 2% (20 MG/ML) 5 ML SYRINGE
INTRAMUSCULAR | Status: AC
  Filled 2019-10-11: qty 5

## 2019-10-11 MED ORDER — FENTANYL CITRATE (PF) 100 MCG/2ML IJ SOLN: 25.0000 ug | INTRAMUSCULAR | Status: DC | PRN

## 2019-10-11 MED ORDER — ROCURONIUM BROMIDE 10 MG/ML (PF) SYRINGE
PREFILLED_SYRINGE | INTRAVENOUS | Status: AC
  Filled 2019-10-11: qty 10

## 2019-10-11 MED ORDER — SULFAMETHOXAZOLE-TRIMETHOPRIM 400-80 MG PO TABS: 1.0000 | ORAL_TABLET | Freq: Two times a day (BID) | ORAL | 0 refills | Status: DC

## 2019-10-11 MED ORDER — IOHEXOL 300 MG/ML  SOLN
INTRAMUSCULAR | Status: DC | PRN
  Administered 2019-10-11: 30 mL via URETHRAL

## 2019-10-11 MED ORDER — OXYCODONE HCL 5 MG PO TABS
5.0000 mg | ORAL_TABLET | Freq: Once | ORAL | Status: AC | PRN
  Administered 2019-10-11: 5 mg via ORAL

## 2019-10-11 MED ORDER — TRAMADOL HCL 50 MG PO TABS: 50.0000 mg | ORAL_TABLET | Freq: Four times a day (QID) | ORAL | 0 refills | Status: DC | PRN

## 2019-10-11 MED ORDER — MEPERIDINE HCL 25 MG/ML IJ SOLN: 6.2500 mg | INTRAMUSCULAR | Status: DC | PRN

## 2019-10-11 MED ORDER — LIDOCAINE 2% (20 MG/ML) 5 ML SYRINGE
INTRAMUSCULAR | Status: DC | PRN
  Administered 2019-10-11: 100 mg via INTRAVENOUS

## 2019-10-11 MED ORDER — CIPROFLOXACIN IN D5W 400 MG/200ML IV SOLN
INTRAVENOUS | Status: AC
  Filled 2019-10-11: qty 200

## 2019-10-11 MED ORDER — GENTAMICIN SULFATE 40 MG/ML IJ SOLN
5.0000 mg/kg | INTRAVENOUS | Status: AC
  Administered 2019-10-11: 370 mg via INTRAVENOUS
  Filled 2019-10-11: qty 9.25

## 2019-10-11 SURGICAL SUPPLY — 23 items
BAG DRAIN URO-CYSTO SKYTR STRL (DRAIN) ×3 IMPLANT
BAG DRN UROCATH (DRAIN) ×1
BASKET LASER NITINOL 1.9FR (BASKET) IMPLANT
BASKET STONE NCOMPASS (UROLOGICAL SUPPLIES) ×2 IMPLANT
BSKT STON RTRVL 120 1.9FR (BASKET)
CATH URET 5FR 28IN OPEN ENDED (CATHETERS) ×3 IMPLANT
CATH URET DUAL LUMEN 6-10FR 50 (CATHETERS) IMPLANT
CLOTH BEACON ORANGE TIMEOUT ST (SAFETY) ×3 IMPLANT
EXTRACTOR STONE 1.7FRX115CM (UROLOGICAL SUPPLIES) ×2 IMPLANT
FIBER LASER TRAC TIP (UROLOGICAL SUPPLIES) ×2 IMPLANT
GLOVE BIO SURGEON STRL SZ7.5 (GLOVE) ×3 IMPLANT
GOWN STRL REUS W/TWL XL LVL3 (GOWN DISPOSABLE) ×3 IMPLANT
GUIDEWIRE STR DUAL SENSOR (WIRE) ×5 IMPLANT
IV NS IRRIG 3000ML ARTHROMATIC (IV SOLUTION) ×6 IMPLANT
KIT TURNOVER CYSTO (KITS) ×3 IMPLANT
MANIFOLD NEPTUNE II (INSTRUMENTS) ×3 IMPLANT
NS IRRIG 500ML POUR BTL (IV SOLUTION) ×3 IMPLANT
PACK CYSTO (CUSTOM PROCEDURE TRAY) ×3 IMPLANT
SHEATH URETERAL 12FRX35CM (MISCELLANEOUS) ×2 IMPLANT
STENT CONTOUR 8FR X 24 (STENTS) ×2 IMPLANT
TUBE CONNECTING 12'X1/4 (SUCTIONS) ×1
TUBE CONNECTING 12X1/4 (SUCTIONS) ×1 IMPLANT
TUBING UROLOGY SET (TUBING) ×3 IMPLANT

## 2019-10-11 NOTE — Transfer of Care (Signed)
Immediate Anesthesia Transfer of Care Note  Patient: [name]  Procedure(s) Performed: LEFT URETEROSCOPY/HOLMIUM LASER/STENT EXCHANGE (Left Ureter)  Patient Location: PACU  Anesthesia Type:General  Level of Consciousness: drowsy  Airway & Oxygen Therapy: Patient Spontanous Breathing and Patient connected to face mask oxygen  Post-op Assessment: Report given to RN and Post -op Vital signs reviewed and stable  Post vital signs: Reviewed and stable  Last Vitals:  Vitals Value Taken Time  BP 146/73 10/11/19 0947  Temp    Pulse 92 10/11/19 0950  Resp 20 10/11/19 0950  SpO2 98 % 10/11/19 0950  Vitals shown include unvalidated device data.  Last Pain:  Vitals:   10/11/19 0605  TempSrc: Oral  PainSc: 7       Patients Stated Pain Goal: 5 (10/11/19 4098)  Complications: No complications documented.

## 2019-10-11 NOTE — Anesthesia Postprocedure Evaluation (Signed)
Anesthesia Post Note  Patient: [name]  Procedure(s) Performed: LEFT URETEROSCOPY/HOLMIUM LASER/STENT EXCHANGE (Left Ureter)     Patient location during evaluation: PACU Anesthesia Type: General Level of consciousness: awake and alert Pain management: pain level controlled Vital Signs Assessment: post-procedure vital signs reviewed and stable Respiratory status: spontaneous breathing, nonlabored ventilation, respiratory function stable and patient connected to nasal cannula oxygen Cardiovascular status: blood pressure returned to baseline and stable Postop Assessment: no apparent nausea or vomiting Anesthetic complications: no   No complications documented.  Last Vitals:  Vitals:   10/11/19 1035 10/11/19 1122  BP:  (!) 146/94  Pulse: 84 70  Resp: 23 22  Temp: (!) 36.4 C 36.7 C  SpO2: 91% 97%    Last Pain:  Vitals:   10/11/19 1122  TempSrc: Oral  PainSc: 0-No pain                 Raygan Skarda

## 2019-10-11 NOTE — Discharge Instructions (Signed)
  Post Anesthesia Home Care Instructions  Activity: Get plenty of rest for the remainder of the day. A responsible adult should stay with you for 24 hours following the procedure.  For the next 24 hours, DO NOT: -Drive a car -Advertising copywriter -Drink alcoholic beverages -Take any medication unless instructed by your physician -Make any legal decisions or sign important papers.  Meals: Start with liquid foods such as gelatin or soup. Progress to regular foods as tolerated. Avoid greasy, spicy, heavy foods. If nausea and/or vomiting occur, drink only clear liquids until the nausea and/or vomiting subsides. Call your physician if vomiting continues.  Special Instructions/Symptoms: Your throat may feel dry or sore from the anesthesia or the breathing tube placed in your throat during surgery. If this causes discomfort, gargle with warm salt water. The discomfort should disappear within 24 hours.  If you had a scopolamine patch placed behind your ear for the management of post- operative nausea and/or vomiting:  1. The medication in the patch is effective for 72 hours, after which it should be removed.  Wrap patch in a tissue and discard in the trash. Wash hands thoroughly with soap and water. 2. You may remove the patch earlier than 72 hours if you experience unpleasant side effects which may include dry mouth, dizziness or visual disturbances. 3. Avoid touching the patch. Wash your hands with soap and water after contact with the patch.   DISCHARGE INSTRUCTIONS FOR KIDNEY STONE/URETERAL STENT   MEDICATIONS:  1. Resume all your other meds from home - except do not take any extra narcotic pain meds that you may have at home.  2. Pyridium is to help with the burning/stinging when you urinate. 3. Tramadol is for moderate/severe pain, otherwise taking upto 1000 mg every 6 hours of plainTylenol will help treat your pain.  4. Take the antibiotic as prescribed.  ACTIVITY:  1. No strenuous  activity x 1week  2. No driving while on narcotic pain medications  3. Drink plenty of water  4. Continue to walk at home - you can still get blood clots when you are at home, so keep active, but don't over do it.  5. May return to work/school tomorrow or when you feel ready   BATHING:  1. You can shower and we recommend daily showers   SIGNS/SYMPTOMS TO CALL:  Please call us if you have a fever greater than 101.5, uncontrolled nausea/vomiting, uncontrolled pain, dizziness, unable to urinate, bloody urine, chest pain, shortness of breath, leg swelling, leg pain, redness around wound, drainage from wound, or any other concerns or questions.   You can reach [place] at [phone].   FOLLOW-UP:  1. You will be scheduled for f/u in 2 weeks with a repeat CT scan for further evaluation.

## 2019-10-11 NOTE — Interval H&P Note (Signed)
History and Physical Interval Note: Stent placed and patient placed on ABX.  Presents now for removal of her stone.  No changes to the above.  10/11/2019 7:18 AM  [name]  has presented today for surgery, with the diagnosis of LEFT INFECTED URETERAL STONE.  The various methods of treatment have been discussed with the patient and family. After consideration of risks, benefits and other options for treatment, the patient has consented to  Procedure(s): LEFT URETEROSCOPY/HOLMIUM LASER/STENT EXCHANGE (Left) as a surgical intervention.  The patient's history has been reviewed, patient examined, no change in status, stable for surgery.  I have reviewed the patient's chart and labs.  Questions were answered to the patient's satisfaction.     Crist Fat

## 2019-10-11 NOTE — Op Note (Signed)
Preoperative diagnosis:  1. Infected left ureteral stone 2. Pyelonephritis  Postoperative diagnosis:  1. Same  Procedure: 1. Cystoscopy, left retrograde pyelogram with interpretation 2. Left ureteroscopy, laser lithotripsy stone extraction 3. Left ureteral stent exchange  Surgeon: Crist Fat, MD  Anesthesia: General  Complications: None  Intraoperative findings:  #1: Initially, the retrograde pyelogram demonstrated the filling defect in the distal ureter, but no contrast was able to get beyond it.  It subsequently demonstrated a cobblestone type appearance with some proximal hydro-.  The renal pelvis was quite narrowed with 2 large filling defects.  Contrast did not fill up the entire kidney, the there were 2 calyces that did fill with contrast and these were cobblestone type appearance to as well. #2: The patient had a embedded/impacted left distal ureteral stone which I lasered and removed.  The ureteral urothelium was very edematous with some large bullae. #3: A large amount of fibrinous soft exudative material was removed from the ureter and renal pelvis.  I was unable to fully explore the kidney because of the degree of edema in the poor visualization.  I did not have access to the mid pole or lower pole calyces. #4: At the end of the case a 8 French x24 cm ureteral stent was placed  EBL: Minimal  Specimens: Left distal ureteral stone  Indication: [name] is a 34 y.o. patient with history of an infected left ureteral stone and a Proteus UTI.  She initially presented to the emergency department and at that time she was taken urgently to the operating room for stent.  She was placed on antibiotics for 2 weeks and presents today for removal of the stone and further exploration.  After reviewing the management options for treatment, he elected to proceed with the above surgical procedure(s). We have discussed the potential benefits and risks of the procedure, side  effects of the proposed treatment, the likelihood of the patient achieving the goals of the procedure, and any potential problems that might occur during the procedure or recuperation. Informed consent has been obtained.  Description of procedure:  The patient was taken to the operating room and general anesthesia was induced.  The patient was placed in the dorsal lithotomy position, prepped and draped in the usual sterile fashion, and preoperative antibiotics were administered. A preoperative time-out was performed.   21 French 30 degree cystoscope was gently passed through the patient's urethra and into the bladder under visual guidance.  The bladder was normal-appearing in the ureter orifice ease were orthotopic.  Using a stent grasper I pulled the stent down to the urethral meatus and advanced the sensor wire through the stent and up into the left renal pelvis removing the stent over the wire.  I subsequently exchanged the wire for 5 [place] open-ended ureteral catheter and performed retrograde pyelogram with the above findings.  I did have to rewire the open-ended catheter to get it beyond this stone so that I could complete the retrograde pyelogram.  I then remove the open-ended catheter leaving the wire behind as a safety wire.  I then performed ureteroscopy with a dual-lumen semirigid ureteroscope and encountered the patient's stone in the distal ureter.  Using a 200 m laser fiber I fragmented the stone into pieces that were small enough to be removed.  I removed the stone fragments and navigated the remainder of the ureter.  There was a significant amount of exudative a Morphis gelatinous material that I removed with the encompass basket.  Once  I had cleared the ureter I advanced a second wire through the scope and remove the scope over the wire.  I then exchanged the second wire for medium size ureteral access sheath which I placed under fluoroscopic guidance.  Upon removing the inner portion of the  access sheath several very large amorphous gelatinous exudative material was expunged from the kidney.  I then explored the kidney noting a large amount of inflammation and bullous edema.  I was able to get into the upper pole and mid pole or lower pole.  There are several calyces that were inaccessible to me and I was unable to get contrast in when repeating the retrograde pyelogram.  Ultimately I did laser some of these large bullous lesions to help improve visualization as well as drainage.  Having unsuccessfully navigated the entire kidney because of poor visualization and severe edema making the calyces and accessible I opted to place an 8 [place] x24 ureteral stent.  I first slowly backed out the ureteral scope and the access sheath under direct visualization noting no significant ureteral trauma.  There was still quite a bit of bullous edema, but no significant abnormalities otherwise.  I then placed the stent under fluoroscopic guidance over the safety wire advancing the stent to the urethral orifice prior to removing the wire.  Once the wire was completely removed the stent popped into the patient's bladder.  No stent tether was left.  The stent was noted to be well positioned under fluoroscopic guidance.  The bladder subsequently emptied and the patient returned to the PACU in stable condition.  Crist Fat, M.D.

## 2019-10-11 NOTE — Anesthesia Procedure Notes (Signed)
Procedure Name: Intubation Date/Time: 10/11/2019 7:46 AM Performed by: Marny Lowenstein, CRNA Pre-anesthesia Checklist: Patient identified, Emergency Drugs available, Suction available and Patient being monitored Patient Re-evaluated:Patient Re-evaluated prior to induction Oxygen Delivery Method: Circle system utilized Preoxygenation: Pre-oxygenation with 100% oxygen Induction Type: IV induction and Cricoid Pressure applied Ventilation: Mask ventilation without difficulty and Oral airway inserted - appropriate to patient size Laryngoscope Size: Hyacinth Meeker and 2 Grade View: Grade I Tube type: Oral Tube size: 7.0 mm Number of attempts: 1 Airway Equipment and Method: Stylet Placement Confirmation: ETT inserted through vocal cords under direct vision,  positive ETCO2 and breath sounds checked- equal and bilateral Secured at: 21 cm Tube secured with: Tape Dental Injury: Teeth and Oropharynx as per pre-operative assessment

## 2019-10-12 ENCOUNTER — Encounter (HOSPITAL_BASED_OUTPATIENT_CLINIC_OR_DEPARTMENT_OTHER): Payer: Self-pay | Admitting: Urology

## 2019-10-12 NOTE — Addendum Note (Signed)
Addendum  created 10/12/19 1029 by Bethena Midget, MD   Clinical Note Signed

## 2019-10-12 NOTE — Hospital Discharge Follow-Up (Signed)
Called and spoke with patient regarding her soar throat and neck and upper chest muscle pain.  [name] had a GAET with succinylcholine yesterday for stent exchange.  She had an uneventful intubation and anesthetic.  I suspect her discomfort may be secondary to endotracheal tube and muscle pain due to relaxant.  I have reassured her, answered all questions and feel that she understands fully.   ejoddono jr md [phone]

## 2019-11-04 ENCOUNTER — Other Ambulatory Visit: Payer: Self-pay | Admitting: Obstetrics & Gynecology

## 2019-12-13 ENCOUNTER — Ambulatory Visit: Payer: Medicaid Other | Admitting: Physician Assistant

## 2019-12-17 ENCOUNTER — Other Ambulatory Visit: Payer: Self-pay

## 2019-12-17 ENCOUNTER — Other Ambulatory Visit (HOSPITAL_COMMUNITY): Payer: Medicaid Other

## 2019-12-17 ENCOUNTER — Encounter (HOSPITAL_BASED_OUTPATIENT_CLINIC_OR_DEPARTMENT_OTHER): Payer: Self-pay | Admitting: General Surgery

## 2019-12-17 ENCOUNTER — Other Ambulatory Visit: Payer: Self-pay | Admitting: General Surgery

## 2019-12-17 DIAGNOSIS — R590 Localized enlarged lymph nodes: Secondary | ICD-10-CM

## 2019-12-17 NOTE — Progress Notes (Signed)

## 2019-12-18 ENCOUNTER — Other Ambulatory Visit (HOSPITAL_COMMUNITY)
Admission: RE | Admit: 2019-12-18 | Discharge: 2019-12-18 | Disposition: A | Discharge status: Home / Self Care | Payer: Medicaid Other | Source: Ambulatory Visit | Attending: General Surgery | Admitting: General Surgery

## 2019-12-18 DIAGNOSIS — Z01812 Encounter for preprocedural laboratory examination: Secondary | ICD-10-CM | POA: Insufficient documentation

## 2019-12-18 DIAGNOSIS — Z20822 Contact with and (suspected) exposure to covid-19: Secondary | ICD-10-CM | POA: Insufficient documentation

## 2019-12-18 LAB — SARS CORONAVIRUS 2 (TAT 6-24 HRS): SARS Coronavirus 2: NEGATIVE

## 2019-12-19 ENCOUNTER — Ambulatory Visit
Admission: RE | Admit: 2019-12-19 | Discharge: 2019-12-19 | Disposition: A | Discharge status: Home / Self Care | Payer: Medicaid Other | Source: Ambulatory Visit | Attending: General Surgery | Admitting: General Surgery

## 2019-12-19 DIAGNOSIS — R590 Localized enlarged lymph nodes: Secondary | ICD-10-CM

## 2019-12-19 MED ORDER — IOPAMIDOL (ISOVUE-300) INJECTION 61%
100.0000 mL | Freq: Once | INTRAVENOUS | Status: AC | PRN
  Administered 2019-12-19: 100 mL via INTRAVENOUS

## 2019-12-20 ENCOUNTER — Encounter (HOSPITAL_BASED_OUTPATIENT_CLINIC_OR_DEPARTMENT_OTHER): Payer: Self-pay | Admitting: General Surgery

## 2019-12-20 ENCOUNTER — Ambulatory Visit (HOSPITAL_BASED_OUTPATIENT_CLINIC_OR_DEPARTMENT_OTHER): Payer: Medicaid Other | Admitting: Anesthesiology

## 2019-12-20 ENCOUNTER — Ambulatory Visit (HOSPITAL_BASED_OUTPATIENT_CLINIC_OR_DEPARTMENT_OTHER)
Admission: RE | Admit: 2019-12-20 | Discharge: 2019-12-20 | Disposition: A | Discharge status: Home / Self Care | Payer: Medicaid Other | Attending: General Surgery | Admitting: General Surgery

## 2019-12-20 ENCOUNTER — Other Ambulatory Visit: Payer: Self-pay

## 2019-12-20 ENCOUNTER — Encounter (HOSPITAL_BASED_OUTPATIENT_CLINIC_OR_DEPARTMENT_OTHER)
Admission: RE | Disposition: A | Discharge status: Home / Self Care | Payer: Self-pay | Source: Home / Self Care | Attending: General Surgery

## 2019-12-20 DIAGNOSIS — J45909 Unspecified asthma, uncomplicated: Secondary | ICD-10-CM | POA: Insufficient documentation

## 2019-12-20 DIAGNOSIS — L729 Follicular cyst of the skin and subcutaneous tissue, unspecified: Secondary | ICD-10-CM | POA: Diagnosis not present

## 2019-12-20 DIAGNOSIS — Z87891 Personal history of nicotine dependence: Secondary | ICD-10-CM | POA: Insufficient documentation

## 2019-12-20 DIAGNOSIS — R222 Localized swelling, mass and lump, trunk: Secondary | ICD-10-CM | POA: Diagnosis present

## 2019-12-20 DIAGNOSIS — I10 Essential (primary) hypertension: Secondary | ICD-10-CM | POA: Diagnosis not present

## 2019-12-20 DIAGNOSIS — Z79899 Other long term (current) drug therapy: Secondary | ICD-10-CM | POA: Diagnosis not present

## 2019-12-20 DIAGNOSIS — Z6841 Body Mass Index (BMI) 40.0 and over, adult: Secondary | ICD-10-CM | POA: Diagnosis not present

## 2019-12-20 HISTORY — PX: MASS EXCISION: SHX2000

## 2019-12-20 LAB — POCT PREGNANCY, URINE: Preg Test, Ur: NEGATIVE

## 2019-12-20 SURGERY — EXCISION MASS
Anesthesia: General | Laterality: Right

## 2019-12-20 MED ORDER — ONDANSETRON HCL 4 MG/2ML IJ SOLN: 4.0000 mg | Freq: Once | INTRAMUSCULAR | Status: DC | PRN

## 2019-12-20 MED ORDER — GABAPENTIN 300 MG PO CAPS
ORAL_CAPSULE | ORAL | Status: AC
  Filled 2019-12-20: qty 1

## 2019-12-20 MED ORDER — FENTANYL CITRATE (PF) 100 MCG/2ML IJ SOLN: 25.0000 ug | INTRAMUSCULAR | Status: DC | PRN

## 2019-12-20 MED ORDER — CHLORHEXIDINE GLUCONATE CLOTH 2 % EX PADS: 6.0000 | MEDICATED_PAD | Freq: Once | CUTANEOUS | Status: DC

## 2019-12-20 MED ORDER — ONDANSETRON HCL 4 MG/2ML IJ SOLN
INTRAMUSCULAR | Status: AC
  Filled 2019-12-20: qty 2

## 2019-12-20 MED ORDER — ACETAMINOPHEN 325 MG PO TABS: 325.0000 mg | ORAL_TABLET | ORAL | Status: DC | PRN

## 2019-12-20 MED ORDER — OXYCODONE HCL 5 MG PO TABS: 5.0000 mg | ORAL_TABLET | Freq: Once | ORAL | Status: DC | PRN

## 2019-12-20 MED ORDER — ACETAMINOPHEN 500 MG PO TABS
ORAL_TABLET | ORAL | Status: AC
  Filled 2019-12-20: qty 2

## 2019-12-20 MED ORDER — PHENYLEPHRINE HCL (PRESSORS) 10 MG/ML IV SOLN
INTRAVENOUS | Status: AC
  Filled 2019-12-20: qty 1

## 2019-12-20 MED ORDER — GABAPENTIN 300 MG PO CAPS
300.0000 mg | ORAL_CAPSULE | ORAL | Status: AC
  Administered 2019-12-20: 300 mg via ORAL

## 2019-12-20 MED ORDER — BUPIVACAINE HCL (PF) 0.25 % IJ SOLN
INTRAMUSCULAR | Status: DC | PRN
  Administered 2019-12-20: 10 mL

## 2019-12-20 MED ORDER — KETOROLAC TROMETHAMINE 15 MG/ML IJ SOLN
INTRAMUSCULAR | Status: AC
  Filled 2019-12-20: qty 1

## 2019-12-20 MED ORDER — LIDOCAINE HCL (CARDIAC) PF 100 MG/5ML IV SOSY
PREFILLED_SYRINGE | INTRAVENOUS | Status: DC | PRN
  Administered 2019-12-20: 100 mg via INTRATRACHEAL

## 2019-12-20 MED ORDER — MIDAZOLAM HCL 2 MG/2ML IJ SOLN
INTRAMUSCULAR | Status: AC
  Filled 2019-12-20: qty 2

## 2019-12-20 MED ORDER — ONDANSETRON HCL 4 MG/2ML IJ SOLN
INTRAMUSCULAR | Status: DC | PRN
  Administered 2019-12-20: 4 mg via INTRAVENOUS

## 2019-12-20 MED ORDER — PROPOFOL 10 MG/ML IV BOLUS
INTRAVENOUS | Status: DC | PRN
  Administered 2019-12-20: 200 mg via INTRAVENOUS

## 2019-12-20 MED ORDER — FENTANYL CITRATE (PF) 100 MCG/2ML IJ SOLN
INTRAMUSCULAR | Status: AC
  Filled 2019-12-20: qty 2

## 2019-12-20 MED ORDER — OXYCODONE HCL 5 MG/5ML PO SOLN: 5.0000 mg | Freq: Once | ORAL | Status: DC | PRN

## 2019-12-20 MED ORDER — MEPERIDINE HCL 25 MG/ML IJ SOLN: 6.2500 mg | INTRAMUSCULAR | Status: DC | PRN

## 2019-12-20 MED ORDER — CEFAZOLIN SODIUM-DEXTROSE 2-4 GM/100ML-% IV SOLN
2.0000 g | INTRAVENOUS | Status: AC
  Administered 2019-12-20: 2 g via INTRAVENOUS

## 2019-12-20 MED ORDER — ACETAMINOPHEN 160 MG/5ML PO SOLN: 325.0000 mg | ORAL | Status: DC | PRN

## 2019-12-20 MED ORDER — LIDOCAINE-EPINEPHRINE (PF) 1 %-1:200000 IJ SOLN
INTRAMUSCULAR | Status: DC | PRN
  Administered 2019-12-20: 10 mL

## 2019-12-20 MED ORDER — LACTATED RINGERS IV SOLN: INTRAVENOUS | Status: DC

## 2019-12-20 MED ORDER — KETOROLAC TROMETHAMINE 15 MG/ML IJ SOLN
15.0000 mg | INTRAMUSCULAR | Status: AC
  Administered 2019-12-20: 15 mg via INTRAVENOUS

## 2019-12-20 MED ORDER — FENTANYL CITRATE (PF) 100 MCG/2ML IJ SOLN
INTRAMUSCULAR | Status: DC | PRN
  Administered 2019-12-20: 50 ug via INTRAVENOUS

## 2019-12-20 MED ORDER — ACETAMINOPHEN 500 MG PO TABS
1000.0000 mg | ORAL_TABLET | ORAL | Status: AC
  Administered 2019-12-20: 1000 mg via ORAL

## 2019-12-20 MED ORDER — PROPOFOL 10 MG/ML IV BOLUS
INTRAVENOUS | Status: AC
  Filled 2019-12-20: qty 20

## 2019-12-20 MED ORDER — DEXAMETHASONE SODIUM PHOSPHATE 10 MG/ML IJ SOLN
INTRAMUSCULAR | Status: DC | PRN
  Administered 2019-12-20: 10 mg via INTRAVENOUS

## 2019-12-20 MED ORDER — LIDOCAINE 2% (20 MG/ML) 5 ML SYRINGE
INTRAMUSCULAR | Status: AC
  Filled 2019-12-20: qty 5

## 2019-12-20 MED ORDER — OXYCODONE HCL 5 MG PO TABS: 5.0000 mg | ORAL_TABLET | Freq: Four times a day (QID) | ORAL | 0 refills | Status: DC | PRN

## 2019-12-20 SURGICAL SUPPLY — 40 items
ADH SKN CLS APL DERMABOND .7 (GAUZE/BANDAGES/DRESSINGS) ×1
APL PRP STRL LF DISP 70% ISPRP (MISCELLANEOUS) ×1
BLADE HEX COATED 2.75 (ELECTRODE) ×3 IMPLANT
BLADE SURG 10 STRL SS (BLADE) ×3 IMPLANT
BLADE SURG 15 STRL LF DISP TIS (BLADE) ×1 IMPLANT
BLADE SURG 15 STRL SS (BLADE) ×3
CANISTER SUCT 1200ML W/VALVE (MISCELLANEOUS) ×2 IMPLANT
CHLORAPREP W/TINT 26 (MISCELLANEOUS) ×3 IMPLANT
CLOSURE WOUND 1/2 X4 (GAUZE/BANDAGES/DRESSINGS) ×1
COVER MAYO STAND STRL (DRAPES) ×2 IMPLANT
DERMABOND ADVANCED (GAUZE/BANDAGES/DRESSINGS) ×2
DERMABOND ADVANCED .7 DNX12 (GAUZE/BANDAGES/DRESSINGS) ×1 IMPLANT
DRAPE UTILITY XL STRL (DRAPES) ×3 IMPLANT
ELECT REM PT RETURN 9FT ADLT (ELECTROSURGICAL) ×3
ELECTRODE REM PT RTRN 9FT ADLT (ELECTROSURGICAL) ×1 IMPLANT
GAUZE SPONGE 4X4 12PLY STRL LF (GAUZE/BANDAGES/DRESSINGS) ×3 IMPLANT
GLOVE BIO SURGEON STRL SZ 6 (GLOVE) ×3 IMPLANT
GLOVE BIOGEL PI IND STRL 6.5 (GLOVE) ×1 IMPLANT
GLOVE BIOGEL PI INDICATOR 6.5 (GLOVE) ×2
GOWN STRL REUS W/ TWL LRG LVL3 (GOWN DISPOSABLE) ×1 IMPLANT
GOWN STRL REUS W/TWL 2XL LVL3 (GOWN DISPOSABLE) ×3 IMPLANT
GOWN STRL REUS W/TWL LRG LVL3 (GOWN DISPOSABLE) ×3
NDL HYPO 25X1 1.5 SAFETY (NEEDLE) ×1 IMPLANT
NEEDLE HYPO 25X1 1.5 SAFETY (NEEDLE) ×3 IMPLANT
NS IRRIG 1000ML POUR BTL (IV SOLUTION) ×2 IMPLANT
PACK BASIN DAY SURGERY FS (CUSTOM PROCEDURE TRAY) ×3 IMPLANT
PACK UNIVERSAL I (CUSTOM PROCEDURE TRAY) ×2 IMPLANT
PENCIL SMOKE EVACUATOR (MISCELLANEOUS) ×3 IMPLANT
SLEEVE SCD COMPRESS KNEE MED (MISCELLANEOUS) ×2 IMPLANT
SPONGE LAP 18X18 RF (DISPOSABLE) ×3 IMPLANT
STRIP CLOSURE SKIN 1/2X4 (GAUZE/BANDAGES/DRESSINGS) ×2 IMPLANT
SUT MNCRL AB 4-0 PS2 18 (SUTURE) ×3 IMPLANT
SUT VIC AB 3-0 SH 27 (SUTURE) ×3
SUT VIC AB 3-0 SH 27X BRD (SUTURE) ×1 IMPLANT
SYR BULB EAR ULCER 3OZ GRN STR (SYRINGE) ×3 IMPLANT
SYR CONTROL 10ML LL (SYRINGE) ×3 IMPLANT
TOWEL GREEN STERILE FF (TOWEL DISPOSABLE) ×3 IMPLANT
TUBE CONNECTING 20'X1/4 (TUBING) ×1
TUBE CONNECTING 20X1/4 (TUBING) ×1 IMPLANT
YANKAUER SUCT BULB TIP NO VENT (SUCTIONS) ×2 IMPLANT

## 2019-12-20 NOTE — Discharge Instructions (Addendum)
[facility] Office Phone Number [phone]   POST OP INSTRUCTIONS  Always review your discharge instruction sheet given to you by the facility where your surgery was performed.  IF YOU HAVE DISABILITY OR FAMILY LEAVE FORMS, YOU MUST BRING THEM TO THE OFFICE FOR PROCESSING.  DO NOT GIVE THEM TO YOUR DOCTOR.  1. A prescription for pain medication may be given to you upon discharge.  Take your pain medication as prescribed, if needed.  If narcotic pain medicine is not needed, then you may take acetaminophen (Tylenol) or ibuprofen (Advil) as needed. 2. Take your usually prescribed medications unless otherwise directed 3. If you need a refill on your pain medication, please contact your pharmacy.  They will contact our office to request authorization.  Prescriptions will not be filled after 5pm or on week-ends. 4. You should eat very light the first 24 hours after surgery, such as soup, crackers, pudding, etc.  Resume your normal diet the day after surgery 5. It is common to experience some constipation if taking pain medication after surgery.  Increasing fluid intake and taking a stool softener will usually help or prevent this problem from occurring.  A mild laxative (Milk of Magnesia or Miralax) should be taken according to package directions if there are no bowel movements after 48 hours. 6. You may shower in 48 hours.  The surgical glue will flake off in 2-3 weeks.   7. ACTIVITIES:  No strenuous activity or heavy lifting for 1 week.   a. You may drive when you no longer are taking prescription pain medication, you can comfortably wear a seatbelt, and you can safely maneuver your car and apply brakes. b. RETURN TO WORK:  __________1 week_______________ [name] should see your doctor in the office for a follow-up appointment approximately three-four weeks after your surgery.    WHEN TO CALL YOUR DOCTOR: 1. Fever over 101.0 2. Nausea and/or vomiting. 3. Extreme swelling or  bruising. 4. Continued bleeding from incision. 5. Increased pain, redness, or drainage from the incision.  The clinic staff is available to answer your questions during regular business hours.  Please don't hesitate to call and ask to speak to one of the nurses for clinical concerns.  If you have a medical emergency, go to the nearest emergency room or call 911.  A surgeon from [facility] is always on call at the hospital.  For further questions, please visit [email or web address]    Post Anesthesia Home Care Instructions  Activity: Get plenty of rest for the remainder of the day. A responsible individual must stay with you for 24 hours following the procedure.  For the next 24 hours, DO NOT: -Drive a car -Advertising copywriter -Drink alcoholic beverages -Take any medication unless instructed by your physician -Make any legal decisions or sign important papers.  Meals: Start with liquid foods such as gelatin or soup. Progress to regular foods as tolerated. Avoid greasy, spicy, heavy foods. If nausea and/or vomiting occur, drink only clear liquids until the nausea and/or vomiting subsides. Call your physician if vomiting continues.  Special Instructions/Symptoms: Your throat may feel dry or sore from the anesthesia or the breathing tube placed in your throat during surgery. If this causes discomfort, gargle with warm salt water. The discomfort should disappear within 24 hour  May take Tylenol and Ibuprofen as needed after 7pm.

## 2019-12-20 NOTE — Anesthesia Preprocedure Evaluation (Addendum)
Anesthesia Evaluation  Patient identified by MRN, date of birth, ID band Patient awake    Reviewed: Allergy & Precautions, H&P , NPO status , Patient's Chart, lab work & pertinent test results, reviewed documented beta blocker date and time   Airway Mallampati: I  TM Distance: >3 FB Neck ROM: Full    Dental no notable dental hx. (+) Teeth Intact, Dental Advisory Given   Pulmonary shortness of breath, asthma , former smoker,    Pulmonary exam normal breath sounds clear to auscultation       Cardiovascular Exercise Tolerance: Good hypertension, Pt. on medications Normal cardiovascular exam Rhythm:Regular Rate:Normal     Neuro/Psych  Headaches,    GI/Hepatic negative GI ROS, Neg liver ROS,   Endo/Other  Morbid obesity  Renal/GU Renal disease     Musculoskeletal   Abdominal (+) + obese,   Peds  Hematology  (+) Blood dyscrasia, anemia ,   Anesthesia Other Findings   Reproductive/Obstetrics                             Anesthesia Physical  Anesthesia Plan  ASA: III  Anesthesia Plan: General   Post-op Pain Management:    Induction: Intravenous  PONV Risk Score and Plan: 3 and Ondansetron, Dexamethasone and Treatment may vary due to age or medical condition  Airway Management Planned: LMA and Oral ETT  Additional Equipment: None  Intra-op Plan:   Post-operative Plan:   Informed Consent: I have reviewed the patients History and Physical, chart, labs and discussed the procedure including the risks, benefits and alternatives for the proposed anesthesia with the patient or authorized representative who has indicated his/her understanding and acceptance.     Dental Advisory Given  Plan Discussed with: CRNA and Anesthesiologist  Anesthesia Plan Comments: ( )        Anesthesia Quick Evaluation

## 2019-12-20 NOTE — Transfer of Care (Signed)
Immediate Anesthesia Transfer of Care Note  Patient: [name]  Procedure(s) Performed: EXCISION AXILLARY MASS RIGHT (Right )  Patient Location: PACU  Anesthesia Type:General  Level of Consciousness: awake, alert  and oriented  Airway & Oxygen Therapy: Patient Spontanous Breathing and Patient connected to face mask oxygen  Post-op Assessment: Report given to RN and Post -op Vital signs reviewed and stable  Post vital signs: Reviewed and stable  Last Vitals:  Vitals Value Taken Time  BP 132/78 12/20/19 1424  Temp    Pulse 55 12/20/19 1426  Resp 18 12/20/19 1426  SpO2 100 % 12/20/19 1426  Vitals shown include unvalidated device data.  Last Pain:  Vitals:   12/20/19 1232  TempSrc: Oral  PainSc: 2       Patients Stated Pain Goal: 4 (12/20/19 1232)  Complications: No complications documented.

## 2019-12-20 NOTE — Interval H&P Note (Signed)
History and Physical Interval Note:  12/20/2019 1:21 PM  [name]  has presented today for surgery, with the diagnosis of RIGHT AXILLARY MASS.  The various methods of treatment have been discussed with the patient and family. After consideration of risks, benefits and other options for treatment, the patient has consented to  Procedure(s): EXCISION AXILLARY MASS RIGHT (Right) as a surgical intervention.  The patient's history has been reviewed, patient examined, no change in status, stable for surgery.  I have reviewed the patient's chart and labs.  Questions were answered to the patient's satisfaction.     [name]

## 2019-12-20 NOTE — H&P (Signed)
[name] Appointment: 12/10/2019 3:00 PM Location: [facility] Patient #: [id number] DOB: Aug 23, 1985 Single / Language: [place] / Race: Black or African American Female   History of Present Illness Almond Lint MD; 12/10/2019 6:06 PM) The patient is a 34 year old female who presents for a follow-up for Mass. Pt is a 34 yo F with multiple complaints. She was initially referred to our office for evaluation of retroperitoneal adenopathy, raising concern for lymphoma, seen on CT scan on 09/25/19 (see below). CT scan and [place] were ordered by her PCP for abdominal pain with nausea and vomiting. CT scan also noted splenomegaly and kidney stones. She has been followed by [facility] and has had subsequent stent placement, along with treatment of UTIs. Repeat CT abd/pelvis on 10/26/19 showed, "Multiple mildly enlarged left para-aortic lymph nodes up to 1.3 cm, unchanged. High left retroperitoneal adenopathy up to 1.4 cm, unchanged. No new pathologically enlarged abdominal lymph nodes. No pathologically enlarged pelvic lymph nodes. >> Management options include continued close CT abd/pelvis with oral and IV contrast follow-up in 3 months vs further evaluation with PET-CT vs consideration of tissue sampling of the retroperitoneal lymph nodes, as clinically warranted." She states she was recently diagnosed with PCOS.   Pt developed new right axillary mass for which she saw [facility] for. She has had waxing and waning of the mass and it has drained. She got mammogram and ultrasound. Mass appeared to be intradermal. Punch biopsy was suggested and she is here to follow up. She continues to be very concerned because of her continued weight loss, nausea, vomiting, and lymphadenopathy. She also complains of muscle spasms, hair loss "in clumps," and bone pain.   CT abd/pelvis 7/6 IMPRESSION: 11 mm echogenic focus is noted in right hepatic lobe which is most consistent with benign hemangioma  in the absence of any history of malignancy. Follow-up ultrasound in 6-12 months is recommended to ensure stability. The patient does have a history of malignancy, then MRI is recommended evaluate for possible metastatic disease.  No other abnormality seen in the right upper quadrant of the abdomen.   RUQ [place] 7/6 IMPRESSION: 11 mm echogenic focus is noted in right hepatic lobe which is most consistent with benign hemangioma in the absence of any history of malignancy. Follow-up ultrasound in 6-12 months is recommended to ensure stability. The patient does have a history of malignancy, then MRI is recommended evaluate for possible metastatic disease.  No other abnormality seen in the right upper quadrant of the abdomen.   Allergies (Chanel Lonni Fix, CMA; 12/10/2019 3:19 PM) No Known Drug Allergies  [11/16/2019]: Allergies Reconciled   Medication History (Chanel Lonni Fix, CMA; 12/10/2019 3:19 PM) Cetirizine HCl (10MG  Tablet, Oral) Active. Minoxidil (10MG  Tablet, Oral) Active. Bactrim DS (Oral) Specific strength unknown - Active. traZODone HCl (100MG  Tablet, Oral) Active. Vitamin D (Ergocalciferol) (1.25 MG(50000 UT) Capsule, Oral) Active. Medications Reconciled    Review of Systems MD; 12/10/2019 6:06 PM) All other systems negative  Vitals (Chanel Nolan CMA; 12/10/2019 3:19 PM) 12/10/2019 3:19 PM Weight: 230.13 lb Height: 63.5in Body Surface Area: 2.06 m Body Mass Index: 40.12 kg/m  Temp.: 97.16F  BP: 132/84(Sitting, Left Arm, Standard)       Physical Exam 12/12/2019 MD; 12/10/2019 6:07 PM) General Mental Status-Alert. General Appearance-Consistent with stated age. Hydration-Well hydrated. Voice-Normal.  Head and Neck Head-normocephalic, atraumatic with no lesions or palpable masses.  Eye Sclera/Conjunctiva - Bilateral-No scleral icterus.  Chest and Lung Exam Chest and lung exam reveals -  quiet, even and easy  respiratory effort with no use of accessory muscles. Inspection Chest Wall - Normal. Back - normal.  Breast Note: right axillary mass looks c/w hidradenitis.   Cardiovascular Cardiovascular examination reveals -normal pedal pulses bilaterally. Note: regular rate and rhythm  Abdomen Inspection-Inspection Normal. Palpation/Percussion Palpation and Percussion of the abdomen reveal - Soft, Non Tender, No Rebound tenderness, No Rigidity (guarding) and No hepatosplenomegaly.  Peripheral Vascular Upper Extremity Inspection - Bilateral - Normal - No Clubbing, No Cyanosis, No Edema, Pulses Intact. Lower Extremity Palpation - Edema - Bilateral - No edema - Bilateral.  Neurologic Neurologic evaluation reveals -alert and oriented x 3 with no impairment of recent or remote memory. Mental Status-Normal.  Musculoskeletal Global Assessment -Note: no gross deformities.  Normal Exam - Left-Upper Extremity Strength Normal and Lower Extremity Strength Normal. Normal Exam - Right-Upper Extremity Strength Normal and Lower Extremity Strength Normal. Note: tender over left greater trochanter   Lymphatic Head & Neck  General Head & Neck Lymphatics: Bilateral - Description - Normal. Axillary  General Axillary Region: Bilateral - Description - Normal. Tenderness - Non Tender.    Assessment & Plan Almond Lint MD; 12/10/2019 6:12 PM) AXILLARY MASS, RIGHT (R22.31) Impression: This clinically is consistent with hidradenitis. However, given weight loss and lymphadenopathy, will excise. I would not punch biopsy it as if hidradenitis, will not heal well. I do not think this is related to her other complaints.  I reviewed risk of infected wound and bleeding. ABDOMINAL PAIN (R10.9) Impression: Unclear etiology. ? hyperparathyroidism. She has likely had a calcium level drawn, but this could explain many of her symptoms. I will get pre op labs that will show if hypercalcemia is  present.  If pet is unrevealing, will refer to GI. RETROPERITONEAL LYMPHADENOPATHY (R59.0) Impression: Will get PET scan. If these light up, will see if amenable to perc core needle biopsy LEFT HIP PAIN (M25.552) Impression: Given names for ortho urgent care. ? trochateric bursitis. HAIR LOSS (L65.9) Impression: likely related to weight loss. I think her PCP has likely checked thyroid function. She will ask at next visit. UNINTENTIONAL WEIGHT LOSS (R63.4) Impression: Not sure if this reflects true change in her habits that continued from when she was tryign to lose weight, however, given adenopathy and generalized malaise, I would not blow this off. PET as first step, then GI referral if negative.

## 2019-12-20 NOTE — Op Note (Signed)
PRE-OPERATIVE DIAGNOSIS: right axillary mass  POST-OPERATIVE DIAGNOSIS:  Same  PROCEDURE:  Procedure(s): Excision of right axillary subcutaneous mass 2 x 1 x 3 cm  SURGEON:  Surgeon(s): Almond Lint, MD  ANESTHESIA:   local and general  DRAINS: none   LOCAL MEDICATIONS USED:  BUPIVICAINE  and LIDOCAINE   SPECIMEN:  Source of Specimen:  right axillary mass  DISPOSITION OF SPECIMEN:  PATHOLOGY  COUNTS:  YES  DICTATION: .Dragon Dictation  PLAN OF CARE: Discharge to home after PACU  PATIENT DISPOSITION:  PACU - hemodynamically stable.  FINDINGS:  Consistent with chronic hidradenitis  EBL: min  PROCEDURE:  Patient was identified in the holding area and taken to the OR where she was placed supine on the OR table.  General anesthesia was induced.  The right axilla was prepped and draped in sterile fashion.  A time out was performed according to the surgical safety checklist.  When all was correct, we continued.    Local was administered around the mass.  An ellipse was made around the mass. The mass was removed with the cautery.  Cautery was used for hemostasis.  The skin was then closed with interrupted 3-0 vicryl deep dermal interrupted sutures and 4-0 monocryl running subcuticular sutures.    The wound was cleaned, dried, and dressed with dermabond.  The patient was allowed to emerge from anesthesia and taken to the OR in stable condition.  Needle, sponge, and instrument counts were correct x 2.

## 2019-12-20 NOTE — Anesthesia Procedure Notes (Signed)
Procedure Name: LMA Insertion Date/Time: 12/20/2019 1:44 PM Performed by: [place], Les Longmore C, CRNA Pre-anesthesia Checklist: Patient identified, Emergency Drugs available, Suction available and Patient being monitored Patient Re-evaluated:Patient Re-evaluated prior to induction Oxygen Delivery Method: Circle system utilized Preoxygenation: Pre-oxygenation with 100% oxygen Induction Type: IV induction Ventilation: Mask ventilation without difficulty LMA: LMA inserted LMA Size: 4.0 Number of attempts: 1 Airway Equipment and Method: Bite block Placement Confirmation: positive ETCO2 Tube secured with: Tape Dental Injury: Teeth and Oropharynx as per pre-operative assessment

## 2019-12-21 NOTE — Anesthesia Postprocedure Evaluation (Signed)
Anesthesia Post Note  Patient: [name]  Procedure(s) Performed: EXCISION AXILLARY MASS RIGHT (Right )     Patient location during evaluation: PACU Anesthesia Type: General Level of consciousness: awake and alert Pain management: pain level controlled Vital Signs Assessment: post-procedure vital signs reviewed and stable Respiratory status: spontaneous breathing, nonlabored ventilation and respiratory function stable Cardiovascular status: blood pressure returned to baseline and stable Postop Assessment: no apparent nausea or vomiting Anesthetic complications: no   No complications documented.  Last Vitals:  Vitals:   12/20/19 1444 12/20/19 1507  BP: 115/81 127/72  Pulse: 60 (!) 58  Resp: 19 16  Temp:  36.6 C  SpO2: 100% 96%    Last Pain:  Vitals:   12/20/19 1507  TempSrc:   PainSc: 0-No pain   Pain Goal: Patients Stated Pain Goal: 4 (12/20/19 1232)                 Lowella Curb

## 2019-12-24 ENCOUNTER — Encounter (HOSPITAL_BASED_OUTPATIENT_CLINIC_OR_DEPARTMENT_OTHER): Payer: Self-pay | Admitting: General Surgery

## 2019-12-25 LAB — SURGICAL PATHOLOGY

## 2020-03-25 ENCOUNTER — Other Ambulatory Visit: Payer: Self-pay

## 2020-03-25 ENCOUNTER — Encounter: Payer: Self-pay | Admitting: Internal Medicine

## 2020-03-25 ENCOUNTER — Ambulatory Visit (INDEPENDENT_AMBULATORY_CARE_PROVIDER_SITE_OTHER): Payer: Medicaid Other | Admitting: Internal Medicine

## 2020-03-25 VITALS — BP 124/72 | HR 76 | Ht 63.0 in | Wt 240.0 lb

## 2020-03-25 DIAGNOSIS — R600 Localized edema: Secondary | ICD-10-CM | POA: Diagnosis not present

## 2020-03-25 DIAGNOSIS — N200 Calculus of kidney: Secondary | ICD-10-CM | POA: Diagnosis not present

## 2020-03-25 DIAGNOSIS — Z6841 Body Mass Index (BMI) 40.0 and over, adult: Secondary | ICD-10-CM

## 2020-03-25 DIAGNOSIS — R0600 Dyspnea, unspecified: Secondary | ICD-10-CM
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
## 2020-03-25 NOTE — Patient Instructions (Signed)
Medication Instructions:  No Changes In Medications at this time.  *If you need a refill on your cardiac medications before your next appointment, please call your pharmacy*  Testing/Procedures: Your physician has requested that you have an echocardiogram. Echocardiography is a painless test that uses sound waves to create images of your heart. It provides your doctor with information about the size and shape of your heart and how well your heart's chambers and valves are working. You may receive an ultrasound enhancing agent through an IV if needed to better visualize your heart during the echo.This procedure takes approximately one hour. There are no restrictions for this procedure. This will take place at the [address].   Follow-Up: At [facility], you and your health needs are our priority.  As part of our continuing mission to provide you with exceptional heart care, we have created designated Provider Care Teams.  These Care Teams include your primary Cardiologist (physician) and Advanced Practice Providers (APPs -  Physician Assistants and Nurse Practitioners) who all work together to provide you with the care you need, when you need it.  Your next appointment:   1 month(s)  The format for your next appointment:   In Person  Provider:   Weston Brass, MD

## 2020-04-15 ENCOUNTER — Ambulatory Visit (HOSPITAL_COMMUNITY): Payer: Medicaid Other | Attending: Internal Medicine

## 2020-04-15 ENCOUNTER — Other Ambulatory Visit: Payer: Self-pay

## 2020-04-15 DIAGNOSIS — R600 Localized edema: Secondary | ICD-10-CM | POA: Diagnosis not present

## 2020-04-15 LAB — ECHOCARDIOGRAM COMPLETE
Area-P 1/2: 3.02 cm2
S' Lateral: 3.7 cm

## 2020-04-22 ENCOUNTER — Other Ambulatory Visit: Payer: Self-pay | Admitting: Internal Medicine

## 2020-04-23 LAB — COMPLETE METABOLIC PANEL WITH GFR
AG Ratio: 1.4 (calc) (ref 1.0–2.5)
ALT: 8 U/L (ref 6–29)
AST: 13 U/L (ref 10–30)
Albumin: 3.9 g/dL (ref 3.6–5.1)
Alkaline phosphatase (APISO): 57 U/L (ref 31–125)
BUN: 11 mg/dL (ref 7–25)
CO2: 27 mmol/L (ref 20–32)
Calcium: 9.3 mg/dL (ref 8.6–10.2)
Chloride: 109 mmol/L (ref 98–110)
Creat: 1.05 mg/dL (ref 0.50–1.10)
GFR, Est African American: 80 mL/min/{1.73_m2} (ref >=60)
GFR, Est Non African American: 69 mL/min/{1.73_m2} (ref >=60)
Globulin: 2.8 g/dL (calc) (ref 1.9–3.7)
Glucose, Bld: 87 mg/dL (ref 65–99)
Potassium: 3.6 mmol/L (ref 3.5–5.3)
Sodium: 143 mmol/L (ref 135–146)
Total Bilirubin: 0.4 mg/dL (ref 0.2–1.2)
Total Protein: 6.7 g/dL (ref 6.1–8.1)

## 2020-04-23 LAB — CBC
HCT: 35.3 % (ref 35.0–45.0)
Hemoglobin: 11.7 g/dL (ref 11.7–15.5)
MCH: 27.9 pg (ref 27.0–33.0)
MCHC: 33.1 g/dL (ref 32.0–36.0)
MCV: 84.2 fL (ref 80.0–100.0)
MPV: 12 fL (ref 7.5–12.5)
Platelets: 265 10*3/uL (ref 140–400)
RBC: 4.19 10*6/uL (ref 3.80–5.10)
RDW: 12.6 % (ref 11.0–15.0)
WBC: 5.4 10*3/uL (ref 3.8–10.8)

## 2020-04-23 LAB — LIPID PANEL
Cholesterol: 162 mg/dL (ref <200)
HDL: 47 mg/dL — ABNORMAL LOW (ref >=50)
LDL Cholesterol (Calc): 92 mg/dL (calc)
Non-HDL Cholesterol (Calc): 115 mg/dL (calc) (ref <130)
Total CHOL/HDL Ratio: 3.4 (calc) (ref <5.0)
Triglycerides: 135 mg/dL (ref <150)

## 2020-04-23 LAB — TSH: TSH: 1.06 mIU/L

## 2020-04-23 LAB — URINE CULTURE
MICRO NUMBER:: 11481481
Result:: NO GROWTH
SPECIMEN QUALITY:: ADEQUATE

## 2020-04-23 LAB — VITAMIN D 25 HYDROXY (VIT D DEFICIENCY, FRACTURES): Vit D, 25-Hydroxy: 31 ng/mL (ref 30–100)

## 2020-04-28 ENCOUNTER — Other Ambulatory Visit: Payer: Self-pay

## 2020-04-28 ENCOUNTER — Ambulatory Visit (INDEPENDENT_AMBULATORY_CARE_PROVIDER_SITE_OTHER): Payer: Medicaid Other | Admitting: Internal Medicine

## 2020-04-28 ENCOUNTER — Encounter: Payer: Self-pay | Admitting: Internal Medicine

## 2020-04-28 VITALS — BP 110/70 | HR 67 | Ht 63.0 in | Wt 251.0 lb

## 2020-04-28 DIAGNOSIS — R600 Localized edema: Secondary | ICD-10-CM | POA: Diagnosis not present

## 2020-04-28 DIAGNOSIS — Z01810 Encounter for preprocedural cardiovascular examination: Secondary | ICD-10-CM | POA: Diagnosis not present

## 2020-04-28 DIAGNOSIS — R0600 Dyspnea, unspecified: Secondary | ICD-10-CM
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
## 2020-04-28 NOTE — Patient Instructions (Signed)
Medication Instructions:  No Changes In Medications at this time.  *If you need a refill on your cardiac medications before your next appointment, please call your pharmacy*  Follow-Up: At [facility], you and your health needs are our priority.  As part of our continuing mission to provide you with exceptional heart care, we have created designated Provider Care Teams.  These Care Teams include your primary Cardiologist (physician) and Advanced Practice Providers (APPs -  Physician Assistants and Nurse Practitioners) who all work together to provide you with the care you need, when you need it.  Your next appointment:   AS NEEDED   The format for your next appointment:   In Person  Provider:   Gayatri Acharya, MD  

## 2020-04-30 ENCOUNTER — Other Ambulatory Visit: Payer: Self-pay | Admitting: Urology

## 2020-05-28 NOTE — Progress Notes (Signed)
DUE TO COVID-19 ONLY ONE VISITOR IS ALLOWED TO COME WITH YOU AND STAY IN THE WAITING ROOM ONLY DURING PRE OP AND PROCEDURE DAY OF SURGERY. THE 1 VISITOR  MAY VISIT WITH YOU AFTER SURGERY IN YOUR PRIVATE ROOM DURING VISITING HOURS ONLY!  YOU NEED TO HAVE A COVID 19 TEST ON_3/22/2022 ____ @_______ , THIS TEST MUST BE DONE BEFORE SURGERY,  COVID TESTING SITE [address] [place] , IT IS ON THE RIGHT GOING OUT [address] APPROXIMATELY  2 MINUTES PAST [employer] ON THE RIGHT. ONCE YOUR COVID TEST IS COMPLETED,  PLEASE BEGIN THE QUARANTINE INSTRUCTIONS AS OUTLINED IN YOUR HANDOUT.                FARIDA MCREYNOLDS  05/28/2020   Your procedure is scheduled on: 06/13/2020    Report to [facility] Main  Entrance   Report to admitting at  0530    AM     Call this number if you have problems the morning of surgery [phone]    Remember: Do not eat food , candy gum or mints :After Midnight. You may have clear liquids from midnight until 0430am     CLEAR LIQUID DIET   Foods Allowed                                                                       Coffee and tea, regular and decaf                              Plain Jell-O any favor except red or purple                                            Fruit ices (not with fruit pulp)                                      Iced Popsicles                                     Carbonated beverages, regular and diet                                    Cranberry, grape and apple juices Sports drinks like Gatorade Lightly seasoned clear broth or consume(fat free) Sugar, honey syrup   _____________________________________________________________________    BRUSH YOUR TEETH MORNING OF SURGERY AND RINSE YOUR MOUTH OUT, NO CHEWING GUM CANDY OR MINTS.     Take these medicines the morning of surgery with A SIP OF WATER: Inhalers as usual and bring, zyrtec if needed, flonase if needed   DO NOT TAKE ANY DIABETIC  MEDICATIONS DAY OF YOUR SURGERY  You may not have any metal on your body including hair pins and              piercings  Do not wear jewelry, make-up, lotions, powders or perfumes, deodorant             Do not wear nail polish on your fingernails.  Do not shave  48 hours prior to surgery.              Men may shave face and neck.   Do not bring valuables to the hospital. [place].  Contacts, dentures or bridgework may not be worn into surgery.  Leave suitcase in the car. After surgery it may be brought to your room.     Patients discharged the day of surgery will not be allowed to drive home. IF YOU ARE HAVING SURGERY AND GOING HOME THE SAME DAY, YOU MUST HAVE AN ADULT TO DRIVE YOU HOME AND BE WITH YOU FOR 24 HOURS. YOU MAY GO HOME BY TAXI OR UBER OR ORTHERWISE, BUT AN ADULT MUST ACCOMPANY YOU HOME AND STAY WITH YOU FOR 24 HOURS.  Name and phone number of your driver:  Special Instructions: N/A              Please read over the following fact sheets you were given: _____________________________________________________________________  [facility] - Preparing for Surgery Before surgery, you can play an important role.  Because skin is not sterile, your skin needs to be as free of germs as possible.  You can reduce the number of germs on your skin by washing with CHG (chlorahexidine gluconate) soap before surgery.  CHG is an antiseptic cleaner which kills germs and bonds with the skin to continue killing germs even after washing. Please DO NOT use if you have an allergy to CHG or antibacterial soaps.  If your skin becomes reddened/irritated stop using the CHG and inform your nurse when you arrive at Short Stay. Do not shave (including legs and underarms) for at least 48 hours prior to the first CHG shower.  You may shave your face/neck. Please follow these instructions carefully:  1.  Shower with CHG Soap the night  before surgery and the  morning of Surgery.  2.  If you choose to wash your hair, wash your hair first as usual with your  normal  shampoo.  3.  After you shampoo, rinse your hair and body thoroughly to remove the  shampoo.                           4.  Use CHG as you would any other liquid soap.  You can apply chg directly  to the skin and wash                       Gently with a scrungie or clean washcloth.  5.  Apply the CHG Soap to your body ONLY FROM THE NECK DOWN.   Do not use on face/ open                           Wound or open sores. Avoid contact with eyes, ears mouth and genitals (private parts).  Wash face,  Genitals (private parts) with your normal soap.             6.  Wash thoroughly, paying special attention to the area where your surgery  will be performed.  7.  Thoroughly rinse your body with warm water from the neck down.  8.  DO NOT shower/wash with your normal soap after using and rinsing off  the CHG Soap.                9.  Pat yourself dry with a clean towel.            10.  Wear clean pajamas.            11.  Place clean sheets on your bed the night of your first shower and do not  sleep with pets. Day of Surgery : Do not apply any lotions/deodorants the morning of surgery.  Please wear clean clothes to the hospital/surgery center.  FAILURE TO FOLLOW THESE INSTRUCTIONS MAY RESULT IN THE CANCELLATION OF YOUR SURGERY PATIENT SIGNATURE_________________________________  NURSE SIGNATURE__________________________________  ________________________________________________________________________

## 2020-06-02 ENCOUNTER — Encounter (HOSPITAL_COMMUNITY)
Admission: RE | Admit: 2020-06-02 | Discharge: 2020-06-02 | Disposition: A | Discharge status: Home / Self Care | Payer: Medicaid Other | Source: Ambulatory Visit | Attending: Urology | Admitting: Urology

## 2020-06-02 ENCOUNTER — Encounter (HOSPITAL_COMMUNITY): Payer: Self-pay

## 2020-06-02 ENCOUNTER — Other Ambulatory Visit: Payer: Self-pay

## 2020-06-02 DIAGNOSIS — Z01812 Encounter for preprocedural laboratory examination: Secondary | ICD-10-CM | POA: Diagnosis not present

## 2020-06-02 HISTORY — DX: Dyspnea, unspecified: R06.00

## 2020-06-02 HISTORY — DX: Family history of other specified conditions: Z84.89

## 2020-06-02 LAB — CBC
HCT: 40.3 % (ref 36.0–46.0)
Hemoglobin: 12.7 g/dL (ref 12.0–15.0)
MCH: 28 pg (ref 26.0–34.0)
MCHC: 31.5 g/dL (ref 30.0–36.0)
MCV: 89 fL (ref 80.0–100.0)
Platelets: 252 10*3/uL (ref 150–400)
RBC: 4.53 MIL/uL (ref 3.87–5.11)
RDW: 12.8 % (ref 11.5–15.5)
WBC: 5 10*3/uL (ref 4.0–10.5)
nRBC: 0 % (ref 0.0–0.2)

## 2020-06-02 LAB — COMPREHENSIVE METABOLIC PANEL
ALT: 14 U/L (ref 0–44)
AST: 18 U/L (ref 15–41)
Albumin: 4.9 g/dL (ref 3.5–5.0)
Alkaline Phosphatase: 62 U/L (ref 38–126)
Anion gap: 11 (ref 5–15)
BUN: 11 mg/dL (ref 6–20)
CO2: 25 mmol/L (ref 22–32)
Calcium: 9.8 mg/dL (ref 8.9–10.3)
Chloride: 104 mmol/L (ref 98–111)
Creatinine, Ser: 1.07 mg/dL — ABNORMAL HIGH (ref 0.44–1.00)
GFR, Estimated: >60 mL/min (ref >60)
Glucose, Bld: 89 mg/dL (ref 70–99)
Potassium: 3.8 mmol/L (ref 3.5–5.1)
Sodium: 140 mmol/L (ref 135–145)
Total Bilirubin: 0.8 mg/dL (ref 0.3–1.2)
Total Protein: 8.7 g/dL — ABNORMAL HIGH (ref 6.5–8.1)

## 2020-06-02 LAB — TYPE AND SCREEN
ABO/RH(D): O POS
Antibody Screen: NEGATIVE

## 2020-06-02 NOTE — Progress Notes (Addendum)
Anesthesia Review:  PCP: Dr Concepcion Elk  Cardiologist : DR Jacques Navy 03/25/20 and LOV 04/28/20 : Chest x-ray : EKG :03/03/20  Echo : 04/15/20  Stress test: Cardiac Cath :  Activity level: can do a flight of stairs without difficulty  Sleep Study/ CPAP : no Fasting Blood Sugar :      / Checks Blood Sugar -- times a day:   Blood Thinner/ Instructions /Last Dose: ASA / Instructions/ Last Dose :  Urine culture done 06/02/20 routed to DR Marlou Porch.

## 2020-06-02 NOTE — Progress Notes (Signed)
Pt came to preop appt . Stated she had received preop instructions from hospital regarding surgery and has misplaced.  Informed pt that we would have sent out no paperwork ahead regarding preop instructions .  Paperwork would have come from Dr Marlou Porch office.  Called and LVMM for Lossie Faes at [facility] and requested preop instructions be faxed to [phone] to give to pt or that if not received before pt left pt would be going by office of [facility] to pick up preop instructions.  Instructions did not come by fax before pt left preop appt.  Informed pt that instructions were not received and that she would need to go by [facility] to pick up.  PT voiced understanding.

## 2020-06-03 LAB — URINE CULTURE: Culture: NO GROWTH

## 2020-06-11 ENCOUNTER — Other Ambulatory Visit (HOSPITAL_COMMUNITY)
Admission: RE | Admit: 2020-06-11 | Discharge: 2020-06-11 | Disposition: A | Discharge status: Home / Self Care | Payer: Medicaid Other | Source: Ambulatory Visit | Attending: Urology | Admitting: Urology

## 2020-06-11 DIAGNOSIS — Z01812 Encounter for preprocedural laboratory examination: Secondary | ICD-10-CM | POA: Insufficient documentation

## 2020-06-11 DIAGNOSIS — Z20822 Contact with and (suspected) exposure to covid-19: Secondary | ICD-10-CM | POA: Diagnosis not present

## 2020-06-11 LAB — SARS CORONAVIRUS 2 (TAT 6-24 HRS): SARS Coronavirus 2: NEGATIVE

## 2020-06-12 MED ORDER — GENTAMICIN SULFATE 40 MG/ML IJ SOLN
5.0000 mg/kg | INTRAVENOUS | Status: AC
  Administered 2020-06-13: 385.2 mg via INTRAVENOUS
  Filled 2020-06-12: qty 9.75

## 2020-06-13 ENCOUNTER — Inpatient Hospital Stay (HOSPITAL_COMMUNITY): Payer: Medicaid Other | Admitting: Physician Assistant

## 2020-06-13 ENCOUNTER — Inpatient Hospital Stay (HOSPITAL_COMMUNITY): Payer: Medicaid Other | Admitting: Certified Registered"

## 2020-06-13 ENCOUNTER — Other Ambulatory Visit: Payer: Self-pay

## 2020-06-13 ENCOUNTER — Encounter (HOSPITAL_COMMUNITY)
Admission: RE | Disposition: A | Discharge status: Home / Self Care | Payer: Self-pay | Source: Home / Self Care | Attending: Urology

## 2020-06-13 ENCOUNTER — Inpatient Hospital Stay (HOSPITAL_COMMUNITY)
Admission: RE | Admit: 2020-06-13 | Discharge: 2020-06-16 | DRG: 660 | Disposition: A | Discharge status: Home / Self Care | Payer: Medicaid Other | Attending: Urology | Admitting: Urology

## 2020-06-13 ENCOUNTER — Other Ambulatory Visit (HOSPITAL_COMMUNITY): Payer: Self-pay | Admitting: Urology

## 2020-06-13 ENCOUNTER — Encounter (HOSPITAL_COMMUNITY): Payer: Self-pay | Admitting: Urology

## 2020-06-13 DIAGNOSIS — N261 Atrophy of kidney (terminal): Secondary | ICD-10-CM | POA: Diagnosis present

## 2020-06-13 DIAGNOSIS — N12 Tubulo-interstitial nephritis, not specified as acute or chronic: Secondary | ICD-10-CM | POA: Diagnosis present

## 2020-06-13 DIAGNOSIS — Z87442 Personal history of urinary calculi: Secondary | ICD-10-CM | POA: Diagnosis not present

## 2020-06-13 DIAGNOSIS — N289 Disorder of kidney and ureter, unspecified: Secondary | ICD-10-CM

## 2020-06-13 DIAGNOSIS — Z6841 Body Mass Index (BMI) 40.0 and over, adult: Secondary | ICD-10-CM

## 2020-06-13 DIAGNOSIS — N118 Other chronic tubulo-interstitial nephritis: Secondary | ICD-10-CM | POA: Diagnosis present

## 2020-06-13 HISTORY — PX: LAPAROSCOPIC NEPHRECTOMY: SHX1930

## 2020-06-13 LAB — HEMOGLOBIN AND HEMATOCRIT, BLOOD
HCT: 35.7 % — ABNORMAL LOW (ref 36.0–46.0)
Hemoglobin: 11.1 g/dL — ABNORMAL LOW (ref 12.0–15.0)

## 2020-06-13 LAB — BASIC METABOLIC PANEL
Anion gap: 9 (ref 5–15)
BUN: 10 mg/dL (ref 6–20)
CO2: 21 mmol/L — ABNORMAL LOW (ref 22–32)
Calcium: 8.4 mg/dL — ABNORMAL LOW (ref 8.9–10.3)
Chloride: 106 mmol/L (ref 98–111)
Creatinine, Ser: 1.12 mg/dL — ABNORMAL HIGH (ref 0.44–1.00)
GFR, Estimated: >60 mL/min (ref >60)
Glucose, Bld: 182 mg/dL — ABNORMAL HIGH (ref 70–99)
Potassium: 4.3 mmol/L (ref 3.5–5.1)
Sodium: 136 mmol/L (ref 135–145)

## 2020-06-13 LAB — HCG, SERUM, QUALITATIVE: Preg, Serum: NEGATIVE

## 2020-06-13 LAB — HIV ANTIBODY (ROUTINE TESTING W REFLEX): HIV Screen 4th Generation wRfx: NONREACTIVE

## 2020-06-13 SURGERY — NEPHRECTOMY, RADICAL, LAPAROSCOPIC, ADULT
Anesthesia: General | Laterality: Left

## 2020-06-13 MED ORDER — FENTANYL CITRATE (PF) 100 MCG/2ML IJ SOLN
INTRAMUSCULAR | Status: AC
  Administered 2020-06-13: 25 ug via INTRAVENOUS
  Filled 2020-06-13: qty 2

## 2020-06-13 MED ORDER — BUPIVACAINE LIPOSOME 1.3 % IJ SUSP
20.0000 mL | Freq: Once | INTRAMUSCULAR | Status: AC
  Administered 2020-06-13: 20 mL
  Filled 2020-06-13: qty 20

## 2020-06-13 MED ORDER — PHENYLEPHRINE HCL (PRESSORS) 10 MG/ML IV SOLN
INTRAVENOUS | Status: AC
  Filled 2020-06-13: qty 1

## 2020-06-13 MED ORDER — FLUTICASONE PROPIONATE 50 MCG/ACT NA SUSP
2.0000 | Freq: Every day | NASAL | Status: DC | PRN
  Filled 2020-06-13: qty 16

## 2020-06-13 MED ORDER — DOCUSATE SODIUM 100 MG PO CAPS
100.0000 mg | ORAL_CAPSULE | Freq: Two times a day (BID) | ORAL | Status: DC
  Administered 2020-06-13 – 2020-06-16 (×7): 100 mg via ORAL
  Filled 2020-06-13 (×8): qty 1

## 2020-06-13 MED ORDER — LIDOCAINE 2% (20 MG/ML) 5 ML SYRINGE
INTRAMUSCULAR | Status: DC | PRN
  Administered 2020-06-13: 100 mg via INTRAVENOUS

## 2020-06-13 MED ORDER — FENTANYL CITRATE (PF) 100 MCG/2ML IJ SOLN
INTRAMUSCULAR | Status: AC
  Filled 2020-06-13: qty 2

## 2020-06-13 MED ORDER — NITROFURANTOIN MACROCRYSTAL 50 MG PO CAPS
50.0000 mg | ORAL_CAPSULE | Freq: Every day | ORAL | Status: DC
  Administered 2020-06-13 – 2020-06-15 (×3): 50 mg via ORAL
  Filled 2020-06-13 (×3): qty 1

## 2020-06-13 MED ORDER — ONDANSETRON HCL 4 MG/2ML IJ SOLN
INTRAMUSCULAR | Status: DC | PRN
  Administered 2020-06-13 (×2): 4 mg via INTRAVENOUS

## 2020-06-13 MED ORDER — HYDROMORPHONE HCL 1 MG/ML IJ SOLN
INTRAMUSCULAR | Status: DC | PRN
  Administered 2020-06-13 (×2): .5 mg via INTRAVENOUS

## 2020-06-13 MED ORDER — PROPOFOL 10 MG/ML IV BOLUS
INTRAVENOUS | Status: DC | PRN
  Administered 2020-06-13: 200 mg via INTRAVENOUS

## 2020-06-13 MED ORDER — LORATADINE 10 MG PO TABS
10.0000 mg | ORAL_TABLET | Freq: Every day | ORAL | Status: DC
  Administered 2020-06-13 – 2020-06-16 (×4): 10 mg via ORAL
  Filled 2020-06-13 (×5): qty 1

## 2020-06-13 MED ORDER — SODIUM CHLORIDE (PF) 0.9 % IJ SOLN
INTRAMUSCULAR | Status: DC | PRN
  Administered 2020-06-13: 50 mL

## 2020-06-13 MED ORDER — ESMOLOL HCL 100 MG/10ML IV SOLN
INTRAVENOUS | Status: DC | PRN
  Administered 2020-06-13 (×4): 10 mg via INTRAVENOUS
  Administered 2020-06-13: 20 mg via INTRAVENOUS
  Administered 2020-06-13 (×2): 10 mg via INTRAVENOUS

## 2020-06-13 MED ORDER — ALBUTEROL SULFATE HFA 108 (90 BASE) MCG/ACT IN AERS: 1.0000 | INHALATION_SPRAY | Freq: Four times a day (QID) | RESPIRATORY_TRACT | Status: DC | PRN

## 2020-06-13 MED ORDER — FUROSEMIDE 20 MG PO TABS
20.0000 mg | ORAL_TABLET | Freq: Every day | ORAL | Status: DC
  Administered 2020-06-13 – 2020-06-16 (×4): 20 mg via ORAL
  Filled 2020-06-13 (×5): qty 1

## 2020-06-13 MED ORDER — OXYCODONE HCL 5 MG PO TABS
ORAL_TABLET | ORAL | Status: AC
  Filled 2020-06-13: qty 1

## 2020-06-13 MED ORDER — BUPIVACAINE-EPINEPHRINE 0.5% -1:200000 IJ SOLN
INTRAMUSCULAR | Status: DC | PRN
  Administered 2020-06-13: 30 mL

## 2020-06-13 MED ORDER — ACETAMINOPHEN 10 MG/ML IV SOLN
INTRAVENOUS | Status: DC | PRN
  Administered 2020-06-13: 1000 mg via INTRAVENOUS

## 2020-06-13 MED ORDER — ROCURONIUM BROMIDE 10 MG/ML (PF) SYRINGE
PREFILLED_SYRINGE | INTRAVENOUS | Status: AC
  Filled 2020-06-13: qty 10

## 2020-06-13 MED ORDER — ROCURONIUM BROMIDE 100 MG/10ML IV SOLN
INTRAVENOUS | Status: DC | PRN
  Administered 2020-06-13: 20 mg via INTRAVENOUS
  Administered 2020-06-13: 70 mg via INTRAVENOUS
  Administered 2020-06-13: 30 mg via INTRAVENOUS
  Administered 2020-06-13: 10 mg via INTRAVENOUS

## 2020-06-13 MED ORDER — DEXMEDETOMIDINE (PRECEDEX) IN NS 20 MCG/5ML (4 MCG/ML) IV SYRINGE
PREFILLED_SYRINGE | INTRAVENOUS | Status: DC | PRN
  Administered 2020-06-13: 4 ug via INTRAVENOUS
  Administered 2020-06-13: 8 ug via INTRAVENOUS
  Administered 2020-06-13 (×2): 4 ug via INTRAVENOUS

## 2020-06-13 MED ORDER — ACETAMINOPHEN 10 MG/ML IV SOLN
INTRAVENOUS | Status: AC
  Filled 2020-06-13: qty 100

## 2020-06-13 MED ORDER — TRAMADOL HCL 50 MG PO TABS: 50.0000 mg | ORAL_TABLET | Freq: Four times a day (QID) | ORAL | Status: DC | PRN

## 2020-06-13 MED ORDER — MIDAZOLAM HCL 2 MG/2ML IJ SOLN
INTRAMUSCULAR | Status: AC
  Filled 2020-06-13: qty 2

## 2020-06-13 MED ORDER — BUPIVACAINE-EPINEPHRINE (PF) 0.5% -1:200000 IJ SOLN
INTRAMUSCULAR | Status: AC
  Filled 2020-06-13: qty 30

## 2020-06-13 MED ORDER — KETAMINE HCL 10 MG/ML IJ SOLN
INTRAMUSCULAR | Status: DC | PRN
  Administered 2020-06-13 (×3): 10 mg via INTRAVENOUS

## 2020-06-13 MED ORDER — VASOPRESSIN 20 UNIT/ML IV SOLN
INTRAVENOUS | Status: AC
  Filled 2020-06-13: qty 1

## 2020-06-13 MED ORDER — CHLORHEXIDINE GLUCONATE 0.12 % MT SOLN
15.0000 mL | Freq: Once | OROMUCOSAL | Status: AC
  Administered 2020-06-13: 15 mL via OROMUCOSAL

## 2020-06-13 MED ORDER — VASOPRESSIN 20 UNIT/ML IV SOLN
INTRAVENOUS | Status: DC | PRN
  Administered 2020-06-13: 1 [IU] via INTRAVENOUS

## 2020-06-13 MED ORDER — SUGAMMADEX SODIUM 200 MG/2ML IV SOLN
INTRAVENOUS | Status: DC | PRN
  Administered 2020-06-13: 250 mg via INTRAVENOUS

## 2020-06-13 MED ORDER — FENTANYL CITRATE (PF) 100 MCG/2ML IJ SOLN: 25.0000 ug | INTRAMUSCULAR | Status: DC | PRN

## 2020-06-13 MED ORDER — ESMOLOL HCL 100 MG/10ML IV SOLN
INTRAVENOUS | Status: AC
  Filled 2020-06-13: qty 10

## 2020-06-13 MED ORDER — TRAMADOL HCL 50 MG PO TABS: 50.0000 mg | ORAL_TABLET | Freq: Four times a day (QID) | ORAL | 0 refills | Status: DC | PRN

## 2020-06-13 MED ORDER — HYDROMORPHONE HCL 2 MG/ML IJ SOLN
INTRAMUSCULAR | Status: AC
  Filled 2020-06-13: qty 1

## 2020-06-13 MED ORDER — DEXMEDETOMIDINE (PRECEDEX) IN NS 20 MCG/5ML (4 MCG/ML) IV SYRINGE
PREFILLED_SYRINGE | INTRAVENOUS | Status: AC
  Filled 2020-06-13: qty 5

## 2020-06-13 MED ORDER — ACETAMINOPHEN 10 MG/ML IV SOLN
1000.0000 mg | Freq: Four times a day (QID) | INTRAVENOUS | Status: DC
  Filled 2020-06-13: qty 100

## 2020-06-13 MED ORDER — PHENYLEPHRINE HCL-NACL 10-0.9 MG/250ML-% IV SOLN
INTRAVENOUS | Status: DC | PRN
  Administered 2020-06-13: 30 ug/min via INTRAVENOUS

## 2020-06-13 MED ORDER — ACETAMINOPHEN 10 MG/ML IV SOLN
1000.0000 mg | Freq: Four times a day (QID) | INTRAVENOUS | Status: AC
  Administered 2020-06-13 – 2020-06-14 (×4): 1000 mg via INTRAVENOUS
  Filled 2020-06-13 (×3): qty 100

## 2020-06-13 MED ORDER — OXYCODONE HCL 5 MG PO TABS: 10.0000 mg | ORAL_TABLET | Freq: Once | ORAL | Status: AC | PRN

## 2020-06-13 MED ORDER — KETAMINE HCL 10 MG/ML IJ SOLN
INTRAMUSCULAR | Status: AC
  Filled 2020-06-13: qty 1

## 2020-06-13 MED ORDER — OXYCODONE HCL 5 MG/5ML PO SOLN: 5.0000 mg | Freq: Once | ORAL | Status: DC | PRN

## 2020-06-13 MED ORDER — DEXAMETHASONE SODIUM PHOSPHATE 10 MG/ML IJ SOLN
INTRAMUSCULAR | Status: DC | PRN
  Administered 2020-06-13: 10 mg via INTRAVENOUS

## 2020-06-13 MED ORDER — FENTANYL CITRATE (PF) 100 MCG/2ML IJ SOLN
INTRAMUSCULAR | Status: DC | PRN
  Administered 2020-06-13 (×7): 50 ug via INTRAVENOUS

## 2020-06-13 MED ORDER — AMISULPRIDE (ANTIEMETIC) 5 MG/2ML IV SOLN: 10.0000 mg | Freq: Once | INTRAVENOUS | Status: DC | PRN

## 2020-06-13 MED ORDER — PHENYLEPHRINE 40 MCG/ML (10ML) SYRINGE FOR IV PUSH (FOR BLOOD PRESSURE SUPPORT)
PREFILLED_SYRINGE | INTRAVENOUS | Status: DC | PRN
  Administered 2020-06-13 (×2): 80 ug via INTRAVENOUS
  Administered 2020-06-13: 120 ug via INTRAVENOUS
  Administered 2020-06-13 (×2): 80 ug via INTRAVENOUS

## 2020-06-13 MED ORDER — SENNA 8.6 MG PO TABS
1.0000 | ORAL_TABLET | Freq: Two times a day (BID) | ORAL | Status: DC
  Administered 2020-06-13 – 2020-06-16 (×7): 8.6 mg via ORAL
  Filled 2020-06-13 (×8): qty 1

## 2020-06-13 MED ORDER — 0.9 % SODIUM CHLORIDE (POUR BTL) OPTIME
TOPICAL | Status: DC | PRN
  Administered 2020-06-13: 1000 mL

## 2020-06-13 MED ORDER — LACTATED RINGERS IR SOLN
Status: DC | PRN
  Administered 2020-06-13: 1000 mL

## 2020-06-13 MED ORDER — MIDAZOLAM HCL 5 MG/5ML IJ SOLN
INTRAMUSCULAR | Status: DC | PRN
  Administered 2020-06-13: 2 mg via INTRAVENOUS

## 2020-06-13 MED ORDER — FENTANYL CITRATE (PF) 250 MCG/5ML IJ SOLN
INTRAMUSCULAR | Status: AC
  Filled 2020-06-13: qty 5

## 2020-06-13 MED ORDER — TRAMADOL HCL 50 MG PO TABS
50.0000 mg | ORAL_TABLET | Freq: Four times a day (QID) | ORAL | Status: DC | PRN
  Administered 2020-06-13 – 2020-06-14 (×2): 100 mg via ORAL
  Filled 2020-06-13 (×2): qty 2

## 2020-06-13 MED ORDER — OXYCODONE HCL 5 MG PO TABS
5.0000 mg | ORAL_TABLET | Freq: Once | ORAL | Status: DC | PRN
Start: 2020-06-13 — End: 2020-06-13

## 2020-06-13 MED ORDER — CHLORHEXIDINE GLUCONATE CLOTH 2 % EX PADS: 6.0000 | MEDICATED_PAD | Freq: Every day | CUTANEOUS | Status: DC

## 2020-06-13 MED ORDER — EPHEDRINE 5 MG/ML INJ
INTRAVENOUS | Status: AC
  Filled 2020-06-13: qty 10

## 2020-06-13 MED ORDER — CEFAZOLIN SODIUM-DEXTROSE 2-4 GM/100ML-% IV SOLN
INTRAVENOUS | Status: AC
  Filled 2020-06-13: qty 100

## 2020-06-13 MED ORDER — ONDANSETRON 4 MG PO TBDP
4.0000 mg | ORAL_TABLET | Freq: Four times a day (QID) | ORAL | Status: DC | PRN
  Administered 2020-06-15 (×2): 4 mg via ORAL
  Filled 2020-06-13 (×2): qty 1

## 2020-06-13 MED ORDER — TRAZODONE HCL 100 MG PO TABS
100.0000 mg | ORAL_TABLET | Freq: Every day | ORAL | Status: DC
  Administered 2020-06-13 – 2020-06-15 (×3): 100 mg via ORAL
  Filled 2020-06-13 (×4): qty 1

## 2020-06-13 MED ORDER — LIDOCAINE 2% (20 MG/ML) 5 ML SYRINGE
INTRAMUSCULAR | Status: AC
  Filled 2020-06-13: qty 5

## 2020-06-13 MED ORDER — ONDANSETRON HCL 4 MG/2ML IJ SOLN
4.0000 mg | Freq: Four times a day (QID) | INTRAMUSCULAR | Status: DC | PRN
  Administered 2020-06-13 – 2020-06-15 (×6): 4 mg via INTRAVENOUS
  Filled 2020-06-13 (×6): qty 2

## 2020-06-13 MED ORDER — PROPOFOL 10 MG/ML IV BOLUS
INTRAVENOUS | Status: AC
  Filled 2020-06-13: qty 40

## 2020-06-13 MED ORDER — CEFAZOLIN SODIUM-DEXTROSE 2-4 GM/100ML-% IV SOLN
2.0000 g | INTRAVENOUS | Status: AC
  Administered 2020-06-13 (×2): 2 g via INTRAVENOUS
  Filled 2020-06-13: qty 100

## 2020-06-13 MED ORDER — HYDROMORPHONE HCL 1 MG/ML IJ SOLN
0.5000 mg | INTRAMUSCULAR | Status: DC | PRN
  Administered 2020-06-13 – 2020-06-14 (×2): 0.5 mg via INTRAVENOUS
  Filled 2020-06-13 (×2): qty 0.5

## 2020-06-13 MED ORDER — EPHEDRINE SULFATE-NACL 50-0.9 MG/10ML-% IV SOSY
PREFILLED_SYRINGE | INTRAVENOUS | Status: DC | PRN
  Administered 2020-06-13: 10 mg via INTRAVENOUS

## 2020-06-13 MED ORDER — SODIUM CHLORIDE (PF) 0.9 % IJ SOLN
INTRAMUSCULAR | Status: AC
  Filled 2020-06-13: qty 20

## 2020-06-13 MED ORDER — ONDANSETRON HCL 4 MG/2ML IJ SOLN: 4.0000 mg | Freq: Once | INTRAMUSCULAR | Status: DC | PRN

## 2020-06-13 MED ORDER — LACTATED RINGERS IV SOLN: INTRAVENOUS | Status: DC

## 2020-06-13 MED ORDER — OXYCODONE HCL 5 MG PO TABS
ORAL_TABLET | ORAL | Status: AC
  Administered 2020-06-13: 10 mg via ORAL
  Filled 2020-06-13: qty 1

## 2020-06-13 SURGICAL SUPPLY — 69 items
ADH SKN CLS APL DERMABOND .7 (GAUZE/BANDAGES/DRESSINGS) ×1
APL ESCP 34 STRL LF DISP (HEMOSTASIS)
APL PRP STRL LF DISP 70% ISPRP (MISCELLANEOUS) ×2
APL SRG 38 LTWT LNG FL B (MISCELLANEOUS)
APPLICATOR ARISTA FLEXITIP XL (MISCELLANEOUS) IMPLANT
APPLICATOR SURGIFLO ENDO (HEMOSTASIS) IMPLANT
APPLIER CLIP ROT 10 11.4 M/L (STAPLE)
APR CLP MED LRG 11.4X10 (STAPLE)
BAG DECANTER FOR FLEXI CONT (MISCELLANEOUS) ×1 IMPLANT
BAG LAPAROSCOPIC 12 15 PORT 16 (BASKET) IMPLANT
BAG RETRIEVAL 12/15 (BASKET) ×2
BAG SPEC THK2 15X12 ZIP CLS (MISCELLANEOUS)
BAG ZIPLOCK 12X15 (MISCELLANEOUS) ×1 IMPLANT
BLADE EXTENDED COATED 6.5IN (ELECTRODE) IMPLANT
BLADE SURG SZ10 CARB STEEL (BLADE) IMPLANT
CHLORAPREP W/TINT 26 (MISCELLANEOUS) ×3 IMPLANT
CLIP APPLIE ROT 10 11.4 M/L (STAPLE) IMPLANT
CLIP VESOLOCK LG 6/CT PURPLE (CLIP) ×2 IMPLANT
CLIP VESOLOCK MED LG 6/CT (CLIP) ×4 IMPLANT
CLIP VESOLOCK XL 6/CT (CLIP) ×2 IMPLANT
COVER WAND RF STERILE (DRAPES) ×1 IMPLANT
CUTTER FLEX LINEAR 45M (STAPLE) ×1 IMPLANT
DECANTER SPIKE VIAL GLASS SM (MISCELLANEOUS) ×2 IMPLANT
DERMABOND ADVANCED (GAUZE/BANDAGES/DRESSINGS) ×1
DERMABOND ADVANCED .7 DNX12 (GAUZE/BANDAGES/DRESSINGS) ×1 IMPLANT
DRAPE INCISE IOBAN 66X45 STRL (DRAPES) ×2 IMPLANT
DRAPE WARM FLUID 44X44 (DRAPES) IMPLANT
ELECT PENCIL ROCKER SW 15FT (MISCELLANEOUS) ×2 IMPLANT
ELECT REM PT RETURN 15FT ADLT (MISCELLANEOUS) ×2 IMPLANT
GLOVE SURG ENC TEXT LTX SZ7.5 (GLOVE) ×2 IMPLANT
GOWN STRL REUS W/TWL LRG LVL3 (GOWN DISPOSABLE) ×5 IMPLANT
HEMOSTAT ARISTA ABSORB 3G PWDR (HEMOSTASIS) IMPLANT
HEMOSTAT SURGICEL 4X8 (HEMOSTASIS) ×1 IMPLANT
IRRIG SUCT STRYKERFLOW 2 WTIP (MISCELLANEOUS) ×2
IRRIGATION SUCT STRKRFLW 2 WTP (MISCELLANEOUS) ×1 IMPLANT
KIT BASIN OR (CUSTOM PROCEDURE TRAY) ×2 IMPLANT
KIT TURNOVER KIT A (KITS) ×2 IMPLANT
MANIFOLD NEPTUNE II (INSTRUMENTS) ×2 IMPLANT
NDL INSUFFLATION 14GA 120MM (NEEDLE) IMPLANT
NDL SPNL 22GX3.5 QUINCKE BK (NEEDLE) IMPLANT
NEEDLE INSUFFLATION 14GA 120MM (NEEDLE) IMPLANT
NEEDLE SPNL 22GX3.5 QUINCKE BK (NEEDLE) ×2 IMPLANT
PAD POSITIONING PINK XL (MISCELLANEOUS) ×2 IMPLANT
PROTECTOR NERVE ULNAR (MISCELLANEOUS) ×4 IMPLANT
RELOAD 45 VASCULAR/THIN (ENDOMECHANICALS) ×4 IMPLANT
RELOAD STAPLE 45 2.5 WHT GRN (ENDOMECHANICALS) IMPLANT
RELOAD STAPLE 45 3.5 BLU ETS (ENDOMECHANICALS) IMPLANT
RELOAD STAPLE TA45 3.5 REG BLU (ENDOMECHANICALS) IMPLANT
SCISSORS ENDO CVD 5DCS (MISCELLANEOUS) ×1 IMPLANT
SCISSORS LAP 5X35 DISP (ENDOMECHANICALS) IMPLANT
SET TUBE SMOKE EVAC HIGH FLOW (TUBING) ×2 IMPLANT
SHEARS HARMONIC ACE PLUS 36CM (ENDOMECHANICALS) ×2 IMPLANT
SLEEVE XCEL OPT CAN 5 100 (ENDOMECHANICALS) ×2 IMPLANT
SPONGE LAP 4X18 RFD (DISPOSABLE) IMPLANT
SUT MNCRL AB 4-0 PS2 18 (SUTURE) ×4 IMPLANT
SUT PDS AB 0 CT1 36 (SUTURE) ×2 IMPLANT
SUT VIC AB 2-0 CT1 27 (SUTURE)
SUT VIC AB 2-0 CT1 27XBRD (SUTURE) IMPLANT
SUT VIC AB 3-0 SH 27 (SUTURE) ×2
SUT VIC AB 3-0 SH 27XBRD (SUTURE) IMPLANT
SUT VICRYL 0 UR6 27IN ABS (SUTURE) ×2 IMPLANT
TAPE CLOTH 4X10 WHT NS (GAUZE/BANDAGES/DRESSINGS) ×1 IMPLANT
TOWEL OR 17X26 10 PK STRL BLUE (TOWEL DISPOSABLE) ×2 IMPLANT
TOWEL OR NON WOVEN STRL DISP B (DISPOSABLE) ×2 IMPLANT
TRAY FOLEY MTR SLVR 16FR STAT (SET/KITS/TRAYS/PACK) ×2 IMPLANT
TRAY LAPAROSCOPIC (CUSTOM PROCEDURE TRAY) ×2 IMPLANT
TROCAR BLADELESS OPT 5 100 (ENDOMECHANICALS) ×2 IMPLANT
TROCAR UNIVERSAL OPT 12M 100M (ENDOMECHANICALS) ×3 IMPLANT
TROCAR XCEL 12X100 BLDLESS (ENDOMECHANICALS) ×2 IMPLANT

## 2020-06-13 NOTE — Transfer of Care (Signed)
Immediate Anesthesia Transfer of Care Note  Patient: [name]  Procedure(s) Performed: LEFT LAPAROSCOPIC NEPHRECTOMY (Left )  Patient Location: PACU  Anesthesia Type:General  Level of Consciousness: drowsy  Airway & Oxygen Therapy: Patient Spontanous Breathing and Patient connected to face mask oxygen  Post-op Assessment: Report given to RN and Post -op Vital signs reviewed and stable  Post vital signs: Reviewed and stable  Last Vitals:  Vitals Value Taken Time  BP 133/71 06/13/20 1220  Temp    Pulse 99 06/13/20 1226  Resp 20 06/13/20 1226  SpO2 97 % 06/13/20 1226  Vitals shown include unvalidated device data.  Last Pain:  Vitals:   06/13/20 0630  TempSrc: Oral  PainSc:          Complications: No complications documented.

## 2020-06-13 NOTE — Anesthesia Postprocedure Evaluation (Signed)
Anesthesia Post Note  Patient: [name]  Procedure(s) Performed: LEFT LAPAROSCOPIC NEPHRECTOMY (Left )     Patient location during evaluation: PACU Anesthesia Type: General Level of consciousness: awake and alert Pain management: pain level controlled Vital Signs Assessment: post-procedure vital signs reviewed and stable Respiratory status: spontaneous breathing, nonlabored ventilation, respiratory function stable and patient connected to nasal cannula oxygen Cardiovascular status: blood pressure returned to baseline and stable Postop Assessment: no apparent nausea or vomiting Anesthetic complications: no   No complications documented.  Last Vitals:  Vitals:   06/13/20 1313 06/13/20 1330  BP:  123/77  Pulse:  84  Resp:  15  Temp:    SpO2: 95% 96%    Last Pain:  Vitals:   06/13/20 1330  TempSrc:   PainSc: Asleep                 Lucretia Kern

## 2020-06-13 NOTE — Progress Notes (Signed)
Dr.Herrick states its okay for patient to leave her "belly beads" in place as long as the supporting staff in the OR can move them out of the surgical field appropriately.

## 2020-06-13 NOTE — H&P (Signed)
The patient presents today for follow-up. She is 2 weeks status post left ureteroscopy. She initially had a stent placed about 1 month ago for a Proteus UTI and an obstructing stone. At that time her retrograde pyelogram was difficult to interpret, but appeared to be somewhat to a cobblestone type appearance and poorly filled the upper tract. She was kept on antibiotics for 2 weeks and then taken to the operating room. A similar appearance was noted on the follow-up retrograde. In addition, the stone that was removed was in amorphous/proteinaceous stone. I was unable to access the lower pole because of the edema of her kidney. The ureter was also very edematous. The retrograde pyelograms incompletely filled the entire kidney.   She follows up today for further evaluation. She had a CT scan prior. She is having ongoing voiding symptoms and right-sided flank pain. She is having lower extremity edema and dry mouth. She is complaining of a metallic taste in her mouth as well. She also has a boil in her right armpit that seems to be recurring over the past few months. She has been on Bactrim now for several weeks in a row.   12/21: The patient follows up today with an ultrasound prior. She has the area underneath her axilla biopsied and this was a ruptured follicular cyst. She states that she has started to feel significantly better. She is no longer having any pain. Her systemic symptoms have improved quite significantly. She did have a CT scan about a week ago, which noted significant atrophy of her left kidney.   Interval: Today the patient follows up for further discussion. She continues to have fatigue and lower extremity swelling. She is not having any significant pain. She denies any fevers or chills. She denies any dysuria or gross hematuria. She was started on antibiotic recently for urine culture that was consistent with multi floor all infection. She did tolerate the antibiotics and we stopped it. Since  stopping it she has not had any symptoms of infection. The patient did have CT scan prior to her appointment which demonstrated worsening atrophy of the left kidney and a matrix stone within the lower pole. Her lymphadenopathy in the retroperitoneum as approved.     ALLERGIES: Pepto-Bismol Sage Oil    MEDICATIONS: Famotidine 10 mg tablet  Flonase Allergy Relief 50 mcg/actuation spray, suspension  Medrol 4 mg tablet, dose pack 24mg  PO on Day 1, then decrease by 4mg /day x 5 days  Minoxidil 10 mg tablet  Pepcid     GU PSH: Cysto Remove Stent FB Sim - 11/12/2019 Cystoscopy Insert Stent, Left - 09/25/2019 Locm 300-399Mg /Ml Iodine,1Ml - 04/01/2020, 10/26/2019 Ureteroscopic laser litho, Left - 10/11/2019     NON-GU PSH: Tonsillectomy     GU PMH: Renal atrophy - 04/01/2020, - 02/25/2020 Acute Cystitis/UTI - 03/18/2020 Pyelonephritis - 02/25/2020, - 10/18/2019 Renal calculus - 10/18/2019 Ureteral calculus - 10/18/2019    NON-GU PMH: Asthma    FAMILY HISTORY: None   SOCIAL HISTORY: Marital Status: Unknown Preferred Language: English; Ethnicity: Not Hispanic Or Latino; Race: Black or African American Current Smoking Status: Patient has never smoked.   Tobacco Use Assessment Completed: Used Tobacco in last 30 days? Drinks 1 drink per week.  Does not drink caffeine.    REVIEW OF SYSTEMS:    GU Review Female:   Patient denies frequent urination, hard to postpone urination, burning /pain with urination, get up at night to urinate, leakage of urine, stream starts and stops, trouble starting your stream,  have to strain to urinate, and being pregnant.  Gastrointestinal (Upper):   Patient denies nausea, vomiting, and indigestion/ heartburn.  Gastrointestinal (Lower):   Patient denies diarrhea and constipation.  Constitutional:   Patient denies fever, night sweats, weight loss, and fatigue.  Skin:   Patient denies skin rash/ lesion and itching.  Eyes:   Patient denies blurred vision and double  vision.  Ears/ Nose/ Throat:   Patient denies sore throat and sinus problems.  Hematologic/Lymphatic:   Patient denies swollen glands and easy bruising.  Cardiovascular:   Patient denies leg swelling and chest pains.  Respiratory:   Patient denies cough and shortness of breath.  Endocrine:   Patient denies excessive thirst.  Musculoskeletal:   Patient reports back pain. Patient denies joint pain.  Neurological:   Patient denies headaches and dizziness.  Psychologic:   Patient denies depression and anxiety.   VITAL SIGNS:      04/29/2020 11:02 AM  Weight 245 lb / 111.13 kg  Height 63 in / 160.02 cm  BP 105/76 mmHg  Pulse 69 /min  Temperature 97.7 F / 36.5 C  BMI 43.4 kg/m   MULTI-SYSTEM PHYSICAL EXAMINATION:    Constitutional: Well-nourished. No physical deformities. Normally developed. Good grooming.  Respiratory: Normal breath sounds. No labored breathing, no use of accessory muscles.   Cardiovascular: Regular rate and rhythm. No murmur, no gallop. Normal temperature, normal extremity pulses, no swelling, no varicosities.      Complexity of Data:  Records Review:   Previous Doctor Records, Previous Patient Records  Urine Test Review:   Urinalysis, Urine Culture  X-Ray Review: C.T. Abdomen/Pelvis: Reviewed Films. Discussed With Patient.     PROCEDURES:          Urinalysis w/Scope - 81001 Dipstick Dipstick Cont'd Micro  Color: Yellow Bilirubin: Neg mg/dL WBC/hpf: 40 - 19/FXT  Appearance: Cloudy Ketones: Neg mg/dL RBC/hpf: 0 - 2/hpf  Specific Gravity: 1.025 Blood: Neg ery/uL Bacteria: Mod (26-50/hpf)  pH: 6.0 Protein: Trace mg/dL Cystals: NS (Not Seen)  Glucose: Neg mg/dL Urobilinogen: 0.2 mg/dL Casts: NS (Not Seen)    Nitrites: Neg Trichomonas: Not Present    Leukocyte Esterase: 3+ leu/uL Mucous: Not Present      Epithelial Cells: 0 - 5/hpf      Yeast: NS (Not Seen)      Sperm: Not Present    ASSESSMENT:      ICD-10 Details  1 GU:   Pyelonephritis - N10   2   Renal  atrophy - N27.0    PLAN:            Medications New Meds: Nitrofurantoin 50 mg capsule 1 capsule PO Q HS   #30  0 Refill(s)    Stop Meds: Hydrocodone-Acetaminophen 5 mg-325 mg tablet 1 tablet PO Q 6 H PRN  Start: 10/18/2019  Discontinue: 04/29/2020  - Reason: The medication cycle was completed.  Tramadol Hcl 50 mg tablet 1 tablet PO Q 6 H PRN For severe pain Start: 11/12/2019  Discontinue: 04/29/2020  - Reason: The medication cycle was completed.            Orders Labs Urine Culture          Document Letter(s):  Created for Patient: Clinical Summary         Notes:   The patient has a nonfunctioning left kidney that is atrophied over the past 7 months. 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 She has systemic symptoms as well including swelling and chronic  fatigue. We discussed treatment options, and she actually is improving some. However, ultimately the patient does needed nephrectomy and she would like to proceed with that.   I went through the surgery with the patient in detail, including the laparoscopic nature. We discussed the risks and the benefits of the operation. After going through all of this the patient is opted to proceed. She has already been seen Cardiology, and this point has been cleared for surgery

## 2020-06-13 NOTE — Interval H&P Note (Signed)
History and Physical Interval Note:  06/13/2020 7:30 AM  [name]  has presented today for surgery, with the diagnosis of XANTHOGRANULOMATOUS PYELONEPHRITIS.  The various methods of treatment have been discussed with the patient and family. After consideration of risks, benefits and other options for treatment, the patient has consented to  Procedure(s) with comments: LEFT LAPAROSCOPIC NEPHRECTOMY (Left) - REQUESTING 4 HRS as a surgical intervention.  The patient's history has been reviewed, patient examined, no change in status, stable for surgery.  I have reviewed the patient's chart and labs.  Questions were answered to the patient's satisfaction.     Crist Fat

## 2020-06-13 NOTE — Progress Notes (Signed)
Received pt from PACU, pt walked from stretcher in hall to bed in room, in pain , pain level 10/10. Pt placed to bed, foley pt with pink tinged urine, surgical sites intact. Pain med administered as ordered. SRP, RN

## 2020-06-13 NOTE — Op Note (Signed)
Preoperative diagnosis:  1. Xanthogranulomatous pyelonephritis  Postoperative diagnosis:  1. same   Procedure: 1. Laparoscopic left radical nephrectomy  Surgeon: Crist Fat, MD Resident Assistant: Dr. Margette Fast, MD   Anesthesia: General  Complications: None  Intraoperative findings: The patient had 3 small arteries and a large lymph node at the hilum that we were able to remove.  EBL: 100 mL  Specimens:  Left kidney and proximal ureter  Indication: [name] is a 35 y.o. patient with chronically infected atrophic left kidney.  After reviewing the management options for treatment, he elected to proceed with the above surgical procedure(s). We have discussed the potential benefits and risks of the procedure, side effects of the proposed treatment, the likelihood of the patient achieving the goals of the procedure, and any potential problems that might occur during the procedure or recuperation. Informed consent has been obtained.  Description of procedure:   A site was selected lateral to the umbilicus for placement of the camera port. This was placed using a standard open Hassan technique which allowed entry into the peritoneal cavity under direct vision and without difficulty. A 12 mm Hassan cannula was placed and a pneumoperitoneum established. The camera was then used to inspect the abdomen and there was no evidence of any intra-abdominal injuries or other abnormalities. The remaining abdominal ports were then placed. One 5 mm trocar was placed subcostal margin in the left upper quadrant, the second 5 mm trocar was placed laterally to the camera port so as to triangulate the kidney. An assistant port was then placed in between the camera and the left lateral port. The assistant port was a 12 mm port.   The white line of Toldt was incised allowing the colon to be mobilized medially and the plane between the mesocolon and the anterior layer of Gerotas fascia to be  developed and the kidney exposed. The ureter and gonadal vein were identified inferiorly and the ureter was lifted anteriorly off the psoas muscle. The gonadal vein was then dissected out inferior to the lower pole and 2 clips were placed both superiorly and inferiorly and then ligated. A second 5 mm port was placed in the left lower quadrant to help facilitate lifting up of the kidney. Dissection proceeded superiorly along the gonadal vein until the renal vein was identified. The renal hilum was then carefully isolated with a combination of blunt and sharp dissectiong allowing the renal arterial and venous structures to be separated and isolated.  The smaller posterior renal artery was clipped with 2 small clips down and one up prior to ligation.  The main renal artery was isolated and ligated with a 45 mm Flex ETS stapler. The renal vein was then isolated and also ligated and divided with a 45 mm Flex ETS stapler.  There was a third renal artery in the inferior posterior aspect that I also used small vessel clips to ligate.  The renal vein was taken with the 45 mm Flex ETS stapler.  Gerotas fascia was intentionally entered superiorly and the space between the adrenal gland and the kidney was developed allowing the adrenal gland to be spared. A third staple load was then used which divided the adrenal gland. once the hilum had been ligated dissection ensued from the inferior pole of the kidney. The ureter was transected placing 2 clips on the stay side and one on the specimen side. The lateral attachments of the kidney were then freed. Our attention was then turned to the upper  pole which was dissected off of the spleen and the splenic attachments. The pancreas and colon were noted to be well away from the structures. The remaining of the posterior aspect of the attachments was then ligated using a Harmonic Scalpel. Once the kidney was freed from its attachments it was shown to medially and the vascular hilum  was inspected and noted to be sufficiently hemostatic. There was slight bit of oozing from the adrenal gland which was cauterized and then Surgicel placed over top. Insufflation was then turned down to 8 mm mercury and the kidney bed was noted to be hemostatic.   80cc of Exparel was then injected into the left anterior axillary line b/w the iliac crest and the twelfth rib under laparoscopic guidance. The layer between the tranversus abdominus and the internal oblique was targeted.   The kidney/ureter specimen was then placed into a 12 mm Endocatch II retrieval bag, this was passed through the port site of the assistant port and left lower quadrant. The trochars were then removed under visual guidance to ensure no ongoing port site bleeding was occurring. The extraction incision was extended from the 12 mm left lower quadrant port. The external oblique and internal oblique muscles were spread as best as possible with as little muscle fibers ligated as possible in order to safely extracted the specimen. The internal oblique flash of was then closed with 2-0 Vicryl in an interrupted figure-of-eight fashion. The external oblique fascia was closed with a 0 looped PDS. The camera port was then closed with 2-0 Vicryl the level of the fascia. All incisions were injected with Exparel and reapproximated at the skin with 4-0 monocryl sutures. Dermabond was applied to the skin. The patient tolerated the procedure well and without complications and was transferred to the recovery unit in satisfactory condition.

## 2020-06-13 NOTE — Anesthesia Preprocedure Evaluation (Signed)
Anesthesia Evaluation  Patient identified by MRN, date of birth, ID band Patient awake    Reviewed: Allergy & Precautions, NPO status , Patient's Chart, lab work & pertinent test results  History of Anesthesia Complications Negative for: history of anesthetic complications  Airway Mallampati: II  TM Distance: >3 FB Neck ROM: Full    Dental  (+) Teeth Intact   Pulmonary asthma , former smoker,    Pulmonary exam normal        Cardiovascular negative cardio ROS Normal cardiovascular exam     Neuro/Psych negative neurological ROS     GI/Hepatic negative GI ROS, Neg liver ROS,   Endo/Other  Morbid obesityPCOS  Renal/GU Renal disease (XANTHOGRANULOMATOUS PYELONEPHRITIS)  negative genitourinary   Musculoskeletal negative musculoskeletal ROS (+)   Abdominal   Peds  Hematology negative hematology ROS (+)   Anesthesia Other Findings   Reproductive/Obstetrics                             Anesthesia Physical Anesthesia Plan  ASA: III  Anesthesia Plan: General   Post-op Pain Management:    Induction: Intravenous  PONV Risk Score and Plan: 4 or greater and Ondansetron, Dexamethasone, Treatment may vary due to age or medical condition and Midazolam  Airway Management Planned: Oral ETT  Additional Equipment: None  Intra-op Plan:   Post-operative Plan: Extubation in OR  Informed Consent: I have reviewed the patients History and Physical, chart, labs and discussed the procedure including the risks, benefits and alternatives for the proposed anesthesia with the patient or authorized representative who has indicated his/her understanding and acceptance.     Dental advisory given  Plan Discussed with:   Anesthesia Plan Comments:         Anesthesia Quick Evaluation

## 2020-06-13 NOTE — Anesthesia Procedure Notes (Signed)
Procedure Name: Intubation Date/Time: 06/13/2020 7:45 AM Performed by: Marny Lowenstein, CRNA Pre-anesthesia Checklist: Patient identified, Emergency Drugs available, Suction available and Patient being monitored Patient Re-evaluated:Patient Re-evaluated prior to induction Oxygen Delivery Method: Circle system utilized Preoxygenation: Pre-oxygenation with 100% oxygen Induction Type: IV induction Ventilation: Mask ventilation without difficulty Laryngoscope Size: Miller and 2 Grade View: Grade II Tube type: Oral Tube size: 7.0 mm Number of attempts: 1 Airway Equipment and Method: Patient positioned with wedge pillow and Stylet Placement Confirmation: ETT inserted through vocal cords under direct vision,  positive ETCO2 and breath sounds checked- equal and bilateral Secured at: 22 cm Tube secured with: Tape Dental Injury: Teeth and Oropharynx as per pre-operative assessment

## 2020-06-13 NOTE — Progress Notes (Signed)
Patient and I discussed belly beads, If necessary we will remove them.  If they cause any sort of skin injury, she is willing to assume the risk.  I think we can push them off the surgical field.  I'm okay proceeding, given that she has a religous reason for wearing them.

## 2020-06-14 ENCOUNTER — Encounter (HOSPITAL_COMMUNITY): Payer: Self-pay | Admitting: Urology

## 2020-06-14 LAB — HEMOGLOBIN AND HEMATOCRIT, BLOOD
HCT: 31.4 % — ABNORMAL LOW (ref 36.0–46.0)
Hemoglobin: 10.1 g/dL — ABNORMAL LOW (ref 12.0–15.0)

## 2020-06-14 LAB — BASIC METABOLIC PANEL
Anion gap: 8 (ref 5–15)
BUN: 8 mg/dL (ref 6–20)
CO2: 24 mmol/L (ref 22–32)
Calcium: 8.9 mg/dL (ref 8.9–10.3)
Chloride: 106 mmol/L (ref 98–111)
Creatinine, Ser: 1.08 mg/dL — ABNORMAL HIGH (ref 0.44–1.00)
GFR, Estimated: >60 mL/min (ref >60)
Glucose, Bld: 99 mg/dL (ref 70–99)
Potassium: 3.7 mmol/L (ref 3.5–5.1)
Sodium: 138 mmol/L (ref 135–145)

## 2020-06-14 MED ORDER — MORPHINE SULFATE (PF) 2 MG/ML IV SOLN
2.0000 mg | Freq: Once | INTRAVENOUS | Status: AC
Start: 2020-06-15 — End: 2020-06-15
  Administered 2020-06-15: 2 mg via INTRAVENOUS
  Filled 2020-06-14: qty 1

## 2020-06-14 MED ORDER — HYDROCODONE-ACETAMINOPHEN 5-325 MG PO TABS
1.0000 | ORAL_TABLET | ORAL | Status: DC | PRN
  Administered 2020-06-14: 2 via ORAL
  Administered 2020-06-14 (×2): 1 via ORAL
  Administered 2020-06-15 (×2): 2 via ORAL
  Administered 2020-06-15: 1 via ORAL
  Administered 2020-06-15: 2 via ORAL
  Filled 2020-06-14: qty 1
  Filled 2020-06-14 (×3): qty 2
  Filled 2020-06-14: qty 1
  Filled 2020-06-14 (×3): qty 2

## 2020-06-14 MED FILL — traMADol HCL 50 MG TABS: 50 | 3 days supply | Qty: 20 | Fill #0

## 2020-06-14 NOTE — Plan of Care (Signed)
Patient reports improved pain control with Norco.  Continues to c/o of mild nausea, improved with Zofran.  No vomiting.  Walked in the room and entire length of nursing unit on 7 a to 7p shift.  Voiding without difficulty.

## 2020-06-14 NOTE — Progress Notes (Signed)
Urology Inpatient Progress Report   Intv/Subj: Patient reports nausea and pain this morning.  She denies flatus.  She has been out of bed to the chair but does not feel that she can walk well due to pain.  Active Problems:   Nonfunctioning kidney  Current Facility-Administered Medications  Medication Dose Route Frequency Provider Last Rate Last Admin  . acetaminophen (OFIRMEV) IV 1,000 mg  1,000 mg Intravenous Q6H Margette Fast, MD 400 mL/hr at 06/14/20 0425 1,000 mg at 06/14/20 0425  . albuterol (VENTOLIN HFA) 108 (90 Base) MCG/ACT inhaler 1 puff  1 puff Inhalation Q6H PRN Margette Fast, MD      . Chlorhexidine Gluconate Cloth 2 % PADS 6 each  6 each Topical Daily Crist Fat, MD      . docusate sodium (COLACE) capsule 100 mg  100 mg Oral BID Margette Fast, MD   100 mg at 06/13/20 2310  . fluticasone (FLONASE) 50 MCG/ACT nasal spray 2 spray  2 spray Each Nare Daily PRN Margette Fast, MD      . furosemide (LASIX) tablet 20 mg  20 mg Oral Daily Margette Fast, MD   20 mg at 06/13/20 1553  . HYDROmorphone (DILAUDID) injection 0.5 mg  0.5 mg Intravenous Q2H PRN Margette Fast, MD   0.5 mg at 06/13/20 1529  . loratadine (CLARITIN) tablet 10 mg  10 mg Oral Daily Margette Fast, MD   10 mg at 06/13/20 1553  . nitrofurantoin (MACRODANTIN) capsule 50 mg  50 mg Oral QHS Margette Fast, MD   50 mg at 06/13/20 2310  . ondansetron (ZOFRAN-ODT) disintegrating tablet 4 mg  4 mg Oral Q6H PRN Margette Fast, MD       Or  . ondansetron [facility]) injection 4 mg  4 mg Intravenous Q6H PRN Margette Fast, MD   4 mg at 06/14/20 0210  . senna (SENOKOT) tablet 8.6 mg  1 tablet Oral BID Margette Fast, MD   8.6 mg at 06/13/20 2310  . traMADol (ULTRAM) tablet 50-100 mg  50-100 mg Oral Q6H PRN Crist Fat, MD   100 mg at 06/14/20 0425  . traZODone (DESYREL) tablet 100 mg  100 mg Oral QHS Margette Fast, MD   100 mg at 06/13/20 2310      Objective: Vital: Vitals:   06/13/20 1509 06/13/20 1853 06/13/20 2031 06/14/20 0336  BP: 135/78 133/88 133/78 120/72  Pulse: 74 80 79 83  Resp: 15 16 16 20   Temp: 98.3 F (36.8 C) 98.3 F (36.8 C) 98.1 F (36.7 C) 98.2 F (36.8 C)  TempSrc:   Oral Oral  SpO2: 99% 100% 100% 95%  Weight:      Height:       I/Os: I/O last 3 completed shifts: In: 2658.3 [I.V.:2150; IV Piggyback:508.3] Out: 2075 [Urine:1975; Blood:100]  Physical Exam:  General: Patient is in no apparent distress Lungs: Normal respiratory effort, chest expands symmetrically. GI: Incisions are c/d/i. The abdomen is soft and appropriately tender  Foley: Removed this morning Ext: lower extremities symmetric  Lab Results: Recent Labs    06/13/20 1237 06/14/20 0527  HGB 11.1* 10.1*  HCT 35.7* 31.4*   Recent Labs    06/13/20 1237 06/14/20 0527  NA 136 138  K 4.3 3.7  CL 106 106  CO2 21* 24  GLUCOSE 182* 99  BUN 10 8  CREATININE 1.12* 1.08*  CALCIUM 8.4* 8.9   No results for input(s): LABPT, INR in the last 72 hours. No results for input(s): LABURIN in  the last 72 hours. Results for orders placed or performed during the hospital encounter of 06/11/20  SARS CORONAVIRUS 2 (TAT 6-24 HRS) Nasopharyngeal Nasopharyngeal Swab     Status: None   Collection Time: 06/11/20  2:18 PM   Specimen: Nasopharyngeal Swab  Result Value Ref Range Status   SARS Coronavirus 2 NEGATIVE NEGATIVE Final    Comment: (NOTE) SARS-CoV-2 target nucleic acids are NOT DETECTED.  The SARS-CoV-2 RNA is generally detectable in upper and lower respiratory specimens during the acute phase of infection. Negative results do not preclude SARS-CoV-2 infection, do not rule out co-infections with other pathogens, and should not be used as the sole basis for treatment or other patient management decisions. Negative results must be combined with clinical observations, patient history, and epidemiological information. The  expected result is Negative.  Fact Sheet for Patients: HairSlick.no  Fact Sheet for Healthcare Providers: quierodirigir.com  This test is not yet approved or cleared by the [place] FDA and  has been authorized for detection and/or diagnosis of SARS-CoV-2 by FDA under an Emergency Use Authorization (EUA). This EUA will remain  in effect (meaning this test can be used) for the duration of the COVID-19 declaration under Se ction 564(b)(1) of the Act, 21 U.S.C. section 360bbb-3(b)(1), unless the authorization is terminated or revoked sooner.  Performed at [facility], [address] [place], [place] [address]      Assessment: 35 year old woman with nonfunctional left kidney postop day 1 status post left laparoscopic nephrectomy.   Plan: -will change tramadol to hydrocodone-acetaminophen to see if this helps with pain control -anti-emetics for nausea -encourage ambulation in hall several times -SCDs at all times while in bed -anticipated dc tomorrow  LOS: 1    Kasandra Knudsen, MD Urology 06/14/2020, 7:09 AM

## 2020-06-15 ENCOUNTER — Other Ambulatory Visit (HOSPITAL_COMMUNITY): Payer: Self-pay | Admitting: Urology

## 2020-06-15 MED ORDER — HYDROCODONE-ACETAMINOPHEN 10-325 MG PO TABS
1.0000 | ORAL_TABLET | ORAL | Status: DC | PRN
  Administered 2020-06-15 – 2020-06-16 (×2): 1 via ORAL
  Filled 2020-06-15 (×2): qty 1

## 2020-06-15 MED ORDER — KETOROLAC TROMETHAMINE 15 MG/ML IJ SOLN
15.0000 mg | Freq: Four times a day (QID) | INTRAMUSCULAR | Status: DC
  Administered 2020-06-16 (×2): 15 mg via INTRAVENOUS
  Filled 2020-06-15 (×2): qty 1

## 2020-06-15 MED ORDER — HYDROCODONE-ACETAMINOPHEN 10-325 MG PO TABS: 1.0000 | ORAL_TABLET | ORAL | 0 refills | Status: DC | PRN

## 2020-06-15 NOTE — Progress Notes (Signed)
I have transitioned the patient to higher dose of Vicodin and sent that medication to the pharmacy of her.  In addition, have given her a short course of Toradol to help get on top for pain.  I encouraged her to walk.  She seems better this afternoon and tonight.  She is eating fine and she seems relatively comfortable.  Plan is for her to discharge early in the morning.

## 2020-06-15 NOTE — Plan of Care (Signed)
  Problem: Health Behavior/Discharge Planning: Goal: Ability to manage health-related needs will improve Outcome: Progressing   Problem: Clinical Measurements: Goal: Ability to maintain clinical measurements within normal limits will improve Outcome: Progressing Goal: Will remain free from infection Outcome: Progressing Goal: Diagnostic test results will improve Outcome: Progressing Goal: Respiratory complications will improve Outcome: Progressing Goal: Cardiovascular complication will be avoided Outcome: Progressing   Problem: Activity: Goal: Risk for activity intolerance will decrease Outcome: Progressing   Problem: Nutrition: Goal: Adequate nutrition will be maintained Outcome: Progressing   Problem: Coping: Goal: Level of anxiety will decrease Outcome: Progressing   Problem: Elimination: Goal: Will not experience complications related to bowel motility Outcome: Progressing Goal: Will not experience complications related to urinary retention Outcome: Progressing   Problem: Pain Managment: Goal: General experience of comfort will improve Outcome: Progressing   

## 2020-06-15 NOTE — Plan of Care (Signed)
  Problem: Health Behavior/Discharge Planning: Goal: Ability to manage health-related needs will improve Outcome: Progressing   Problem: Clinical Measurements: Goal: Ability to maintain clinical measurements within normal limits will improve Outcome: Progressing Goal: Will remain free from infection Outcome: Progressing Goal: Diagnostic test results will improve Outcome: Progressing Goal: Respiratory complications will improve Outcome: Progressing Goal: Cardiovascular complication will be avoided Outcome: Progressing   Problem: Activity: Goal: Risk for activity intolerance will decrease Outcome: Progressing   Problem: Nutrition: Goal: Adequate nutrition will be maintained Outcome: Progressing   Problem: Coping: Goal: Level of anxiety will decrease Outcome: Progressing   Problem: Elimination: Goal: Will not experience complications related to bowel motility Outcome: Progressing

## 2020-06-15 NOTE — Progress Notes (Signed)
Urology Inpatient Progress Report   Intv/Subj: Patient with uncontrolled pain overnight requiring IV narcotics.  No flatus.  +ambulation x 1. She reports some nausea.   Active Problems:   Nonfunctioning kidney  Current Facility-Administered Medications  Medication Dose Route Frequency Provider Last Rate Last Admin   albuterol (VENTOLIN HFA) 108 (90 Base) MCG/ACT inhaler 1 puff  1 puff Inhalation Q6H PRN Margette Fast, MD       docusate sodium (COLACE) capsule 100 mg  100 mg Oral BID Margette Fast, MD   100 mg at 06/14/20 2114   fluticasone (FLONASE) 50 MCG/ACT nasal spray 2 spray  2 spray Each Nare Daily PRN Margette Fast, MD       furosemide (LASIX) tablet 20 mg  20 mg Oral Daily Margette Fast, MD   20 mg at 06/14/20 1049   HYDROcodone-acetaminophen (NORCO/VICODIN) 5-325 MG per tablet 1-2 tablet  1-2 tablet Oral Q4H PRN [name] D, MD   2 tablet at 06/15/20 0452   HYDROmorphone (DILAUDID) injection 0.5 mg  0.5 mg Intravenous Q2H PRN Margette Fast, MD   0.5 mg at 06/14/20 2151   loratadine (CLARITIN) tablet 10 mg  10 mg Oral Daily Margette Fast, MD   10 mg at 06/14/20 1049   nitrofurantoin (MACRODANTIN) capsule 50 mg  50 mg Oral QHS Margette Fast, MD   50 mg at 06/14/20 2114   ondansetron (ZOFRAN-ODT) disintegrating tablet 4 mg  4 mg Oral Q6H PRN Margette Fast, MD       Or   ondansetron [facility]) injection 4 mg  4 mg Intravenous Q6H PRN Margette Fast, MD   4 mg at 06/15/20 2979   senna (SENOKOT) tablet 8.6 mg  1 tablet Oral BID Margette Fast, MD   8.6 mg at 06/14/20 2114   traZODone (DESYREL) tablet 100 mg  100 mg Oral Selinda Eon, MD   100 mg at 06/14/20 2114     Objective: Vital: Vitals:   06/14/20 0336 06/14/20 1349 06/14/20 2324 06/15/20 0437  BP: 120/72 (!) 110/56 127/62 (!) 129/103  Pulse: 83 84 79 89  Resp: 20 20 19 20   Temp: 98.2 F (36.8 C) 98.2 F (36.8 C) 98.4 F (36.9 C) 99.5 F (37.5 C)  TempSrc:  Oral Oral Oral Oral  SpO2: 95% 95% 95% 94%  Weight:      Height:       I/Os: I/O last 3 completed shifts: In: 100 [IV Piggyback:100] Out: 1000 [Urine:1000]  Physical Exam:  General: Patient is in no apparent distress Lungs: Normal respiratory effort, chest expands symmetrically. GI: Incisions are c/d/i.  The abdomen is soft and appropriately ttp Ext: lower extremities symmetric  Lab Results: Recent Labs    06/13/20 1237 06/14/20 0527  HGB 11.1* 10.1*  HCT 35.7* 31.4*   Recent Labs    06/13/20 1237 06/14/20 0527  NA 136 138  K 4.3 3.7  CL 106 106  CO2 21* 24  GLUCOSE 182* 99  BUN 10 8  CREATININE 1.12* 1.08*  CALCIUM 8.4* 8.9   No results for input(s): LABPT, INR in the last 72 hours. No results for input(s): LABURIN in the last 72 hours. Results for orders placed or performed during the hospital encounter of 06/11/20  SARS CORONAVIRUS 2 (TAT 6-24 HRS) Nasopharyngeal Nasopharyngeal Swab     Status: None   Collection Time: 06/11/20  2:18 PM   Specimen: Nasopharyngeal Swab  Result Value Ref Range Status   SARS Coronavirus 2 NEGATIVE NEGATIVE Final  Comment: (NOTE) SARS-CoV-2 target nucleic acids are NOT DETECTED.  The SARS-CoV-2 RNA is generally detectable in upper and lower respiratory specimens during the acute phase of infection. Negative results do not preclude SARS-CoV-2 infection, do not rule out co-infections with other pathogens, and should not be used as the sole basis for treatment or other patient management decisions. Negative results must be combined with clinical observations, patient history, and epidemiological information. The expected result is Negative.  Fact Sheet for Patients: HairSlick.no  Fact Sheet for Healthcare Providers: quierodirigir.com  This test is not yet approved or cleared by the [place] FDA and  has been authorized for detection and/or diagnosis of SARS-CoV-2  by FDA under an Emergency Use Authorization (EUA). This EUA will remain  in effect (meaning this test can be used) for the duration of the COVID-19 declaration under Se ction 564(b)(1) of the Act, 21 U.S.C. section 360bbb-3(b)(1), unless the authorization is terminated or revoked sooner.  Performed at [facility], [address] [place], [place] [address]     Studies/Results: No results found.  Assessment: 35 year old woman with nonfunctional left kidney POD#2 s/p left laparoscopic nephrectomy.  Patient with persistent pain and nausea.  Plan: -continue PO hydrocodone-acetaminophen to see if this helps with pain control -anti-emetics for nausea -encourage ambulation in hall several times (minimum 3x today) -SCDs at all times while in bed -anticipated dc later today vs tomorrow   Caitlin Knudsen, MD Urology 06/15/2020, 7:39 AM

## 2020-06-15 NOTE — Discharge Summary (Signed)
Date of admission: 06/13/2020  Date of discharge: 06/16/2020  Admission diagnosis: Atrophic nonfunctioning left kidney, secondary to xanthogranulomatous pyelonephritis  Discharge diagnosis: Same  Secondary diagnoses:  Patient Active Problem List   Diagnosis Date Noted  . 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 Nephrolithiasis 10/06/2015  . Previous cesarean section 05/06/2015  . Morbid obesity with BMI of 40.0-44.9, adult ([place]) 05/06/2015  . Asthma 08/05/2011  . Dyspnea 07/07/2011  . Edema 07/07/2011    Procedures performed: Procedure(s): LEFT LAPAROSCOPIC NEPHRECTOMY  History and Physical: For full details, please see admission history and physical. Briefly, [name] is a 35 y.o. year old patient with chronically infected and atrophic left kidney.   Hospital Course: Patient tolerated the procedure well.  She was then transferred to the floor after an uneventful PACU stay.  Her hospital course was uncomplicated.  She was very slow to ambulate.  Her pain was a little bit more than normal.  As such, she was unable to be discharged as anticipated on postoperative day #1.  Instead, we had to increase her oral pain regimen so that she was then getting Norco 10 mg tablets as well as I did give her a small short of IV Toradol.  On POD#3, she had met discharge criteria: was eating a regular diet, was up and ambulating independently,  pain was well controlled, was voiding without a catheter, and was ready to for discharge.   Laboratory values:  Recent Labs    06/13/20 1237 06/14/20 0527  HGB 11.1* 10.1*  HCT 35.7* 31.4*   Recent Labs    06/13/20 1237 06/14/20 0527  NA 136 138  K 4.3 3.7  CL 106 106  CO2 21* 24  GLUCOSE 182* 99  BUN 10 8  CREATININE 1.12* 1.08*  CALCIUM 8.4* 8.9   No results for input(s): LABPT, INR in the last 72 hours. No results for input(s): LABURIN in the last 72 hours. Results for orders placed or  performed during the hospital encounter of 06/11/20  SARS CORONAVIRUS 2 (TAT 6-24 HRS) Nasopharyngeal Nasopharyngeal Swab     Status: None   Collection Time: 06/11/20  2:18 PM   Specimen: Nasopharyngeal Swab  Result Value Ref Range Status   SARS Coronavirus 2 NEGATIVE NEGATIVE Final    Comment: (NOTE) SARS-CoV-2 target nucleic acids are NOT DETECTED.  The SARS-CoV-2 RNA is generally detectable in upper and lower respiratory specimens during the acute phase of infection. Negative results do not preclude SARS-CoV-2 infection, do not rule out co-infections with other pathogens, and should not be used as the sole basis for treatment or other patient management decisions. Negative results must be combined with clinical observations, patient history, and epidemiological information. The expected result is Negative.  Fact Sheet for Patients: SugarRoll.be  Fact Sheet for Healthcare Providers: https://www.woods-mathews.com/  This test is not yet approved or cleared by the [place] FDA and  has been authorized for detection and/or diagnosis of SARS-CoV-2 by FDA under an Emergency Use Authorization (EUA). This EUA will remain  in effect (meaning this test can be used) for the duration of the COVID-19 declaration under Se ction 564(b)(1) of the Act, 21 U.S.C. section 360bbb-3(b)(1), unless the authorization is terminated or revoked sooner.  Performed at [facility], [place] [address], [place], [place] [address]     Disposition: Home  Discharge instruction: The patient was instructed to be ambulatory but told to refrain from heavy lifting, strenuous activity, or driving.  Discharge medications:  Allergies as of 06/16/2020      Reactions   Pepto-bismol [bismuth] Swelling, Other (See Comments)   Reaction:  Mouth/tongue swelling    Sage [salvia Officinalis] Hives, Swelling, Other (See Comments)   Reaction:  Mouth/tongue swelling       Medication List    TAKE these medications   acetaminophen 325 MG tablet Commonly known as: TYLENOL Take 650 mg by mouth every 6 (six) hours as needed for mild pain or headache.   albuterol 108 (90 Base) MCG/ACT inhaler Commonly known as: VENTOLIN HFA Inhale 1 puff into the lungs every 6 (six) hours as needed for wheezing or shortness of breath.   cetirizine 10 MG tablet Commonly known as: ZYRTEC Take 10 mg by mouth daily as needed for allergies.   ergocalciferol 1.25 MG (50000 UT) capsule Commonly known as: VITAMIN D2 Take 50,000 Units by mouth every Sunday.   fluticasone 50 MCG/ACT nasal spray Commonly known as: FLONASE Place 2 sprays into both nostrils daily as needed for allergies.   furosemide 20 MG tablet Commonly known as: LASIX Take 20 mg by mouth daily.   HYDROcodone-acetaminophen 10-325 MG tablet Commonly known as: NORCO Take 1 tablet by mouth every 4 (four) hours as needed for severe pain.   minoxidil 10 MG tablet Commonly known as: LONITEN Take 10 mg by mouth daily.   multivitamin with minerals Tabs tablet Take 1 tablet by mouth daily.   nitrofurantoin 50 MG capsule Commonly known as: MACRODANTIN Take 50 mg by mouth at bedtime.   traZODone 100 MG tablet Commonly known as: DESYREL Take 100 mg by mouth at bedtime.       Followup:   Follow-up Information    [facility] In 2 weeks.   Contact information: [place] [phone]

## 2020-06-16 LAB — SURGICAL PATHOLOGY

## 2020-06-16 MED FILL — HYDROCODON-APAP 10-325: 10-325 | 2 days supply | Qty: 15 | Fill #0

## 2020-06-16 NOTE — Discharge Instructions (Signed)
1.  Activity:  You are encouraged to ambulate frequently (about every hour during waking hours) to help prevent blood clots from forming in your legs or lungs.  However, you should not engage in any heavy lifting (> 10-15 lbs), strenuous activity, or straining. 2. Diet: You should advance your diet as instructed by your physician.  It will be normal to have some bloating, nausea, and abdominal discomfort intermittently. 3. Prescriptions:  You will be provided a prescription for pain medication to take as needed.  If your pain is not severe enough to require the prescription pain medication, you may take extra strength Tylenol instead which will have less side effects. Do not exceed more that 4g or 4,000mg  of Tylenol within a 24 hour period. Consider all sources (e.g. your narcotic prescription also contains Tylenol). You should also take a prescribed stool softener to avoid straining with bowel movements as the prescription pain medication may constipate you. 4. Incisions: You may remove your dressing bandages 48 hours after surgery if not removed in the hospital.  You will either have some small staples or special tissue glue at each of the incision sites. Once the bandages are removed (if present), the incisions may stay open to air.  You may start showering (but not soaking or bathing in water) the 2nd day after surgery and the incisions simply need to be patted dry after the shower.  No additional care is needed. 5. What to call us about: You should call the office [phone]) if you develop fever > 101 or develop persistent vomiting.

## 2020-07-08 ENCOUNTER — Other Ambulatory Visit: Payer: Self-pay | Admitting: Internal Medicine

## 2020-07-09 LAB — BASIC METABOLIC PANEL WITH GFR
BUN: 12 mg/dL (ref 7–25)
CO2: 25 mmol/L (ref 20–32)
Calcium: 9.3 mg/dL (ref 8.6–10.2)
Chloride: 108 mmol/L (ref 98–110)
Creat: 1 mg/dL (ref 0.50–1.10)
GFR, Est African American: 85 mL/min/{1.73_m2} (ref >=60)
GFR, Est Non African American: 73 mL/min/{1.73_m2} (ref >=60)
Glucose, Bld: 87 mg/dL (ref 65–99)
Potassium: 3.8 mmol/L (ref 3.5–5.3)
Sodium: 140 mmol/L (ref 135–146)

## 2020-07-09 LAB — CBC
HCT: 33.6 % — ABNORMAL LOW (ref 35.0–45.0)
Hemoglobin: 11 g/dL — ABNORMAL LOW (ref 11.7–15.5)
MCH: 28.1 pg (ref 27.0–33.0)
MCHC: 32.7 g/dL (ref 32.0–36.0)
MCV: 85.9 fL (ref 80.0–100.0)
MPV: 12.4 fL (ref 7.5–12.5)
Platelets: 232 10*3/uL (ref 140–400)
RBC: 3.91 10*6/uL (ref 3.80–5.10)
RDW: 12.7 % (ref 11.0–15.0)
WBC: 4.7 10*3/uL (ref 3.8–10.8)

## 2020-09-11 ENCOUNTER — Ambulatory Visit: Payer: Medicaid Other | Admitting: Podiatry

## 2020-09-11 ENCOUNTER — Other Ambulatory Visit: Payer: Self-pay

## 2021-01-01 ENCOUNTER — Other Ambulatory Visit: Payer: Self-pay

## 2021-01-01 ENCOUNTER — Encounter: Payer: Self-pay | Admitting: Certified Nurse Midwife

## 2021-01-01 ENCOUNTER — Ambulatory Visit: Payer: Medicaid Other | Admitting: Certified Nurse Midwife

## 2021-01-01 VITALS — BP 110/70 | HR 111 | Ht 63.5 in | Wt 252.0 lb

## 2021-01-01 DIAGNOSIS — R6889 Other general symptoms and signs: Secondary | ICD-10-CM

## 2021-01-01 DIAGNOSIS — Z01419 Encounter for gynecological examination (general) (routine) without abnormal findings: Secondary | ICD-10-CM | POA: Diagnosis not present

## 2021-01-01 MED ORDER — MAGNESIUM OXIDE -MG SUPPLEMENT 200 MG PO TABS
400.0000 mg | ORAL_TABLET | Freq: Every day | ORAL | 3 refills | Status: AC
Start: 2021-01-01 — End: ?

## 2021-01-01 MED ORDER — VITAMIN D3 50 MCG (2000 UT) PO CAPS: 2000.0000 [IU] | ORAL_CAPSULE | Freq: Every day | ORAL | 0 refills | Status: AC

## 2021-01-01 MED ORDER — FEXOFENADINE HCL 60 MG PO TABS: 60.0000 mg | ORAL_TABLET | Freq: Two times a day (BID) | ORAL | 3 refills | Status: AC

## 2021-01-02 LAB — COMPREHENSIVE METABOLIC PANEL
ALT: 10 IU/L (ref 0–32)
AST: 10 IU/L (ref 0–40)
Albumin/Globulin Ratio: 1.7 (ref 1.2–2.2)
Albumin: 4.5 g/dL (ref 3.8–4.8)
Alkaline Phosphatase: 65 IU/L (ref 44–121)
BUN/Creatinine Ratio: 12 (ref 9–23)
BUN: 11 mg/dL (ref 6–20)
Bilirubin Total: 0.3 mg/dL (ref 0.0–1.2)
CO2: 21 mmol/L (ref 20–29)
Calcium: 9.3 mg/dL (ref 8.7–10.2)
Chloride: 104 mmol/L (ref 96–106)
Creatinine, Ser: 0.94 mg/dL (ref 0.57–1.00)
Globulin, Total: 2.7 g/dL (ref 1.5–4.5)
Glucose: 80 mg/dL (ref 70–99)
Potassium: 4.4 mmol/L (ref 3.5–5.2)
Sodium: 142 mmol/L (ref 134–144)
Total Protein: 7.2 g/dL (ref 6.0–8.5)
eGFR: 81 mL/min/{1.73_m2} (ref >59)

## 2021-01-02 LAB — URINALYSIS, ROUTINE W REFLEX MICROSCOPIC
Bilirubin, UA: NEGATIVE
Glucose, UA: NEGATIVE
Ketones, UA: NEGATIVE
Leukocytes,UA: NEGATIVE
Nitrite, UA: NEGATIVE
Specific Gravity, UA: 1.026 (ref 1.005–1.030)
Urobilinogen, Ur: 0.2 mg/dL (ref 0.2–1.0)
pH, UA: 5.5 (ref 5.0–7.5)

## 2021-01-02 LAB — THYROID PANEL WITH TSH
Free Thyroxine Index: 2.7 (ref 1.2–4.9)
T3 Uptake Ratio: 27 % (ref 24–39)
T4, Total: 10 ug/dL (ref 4.5–12.0)
TSH: 1.48 u[IU]/mL (ref 0.450–4.500)

## 2021-01-02 LAB — VITAMIN D 25 HYDROXY (VIT D DEFICIENCY, FRACTURES): Vit D, 25-Hydroxy: 25.3 ng/mL — ABNORMAL LOW (ref 30.0–100.0)

## 2021-01-02 LAB — MICROSCOPIC EXAMINATION
Casts: NONE SEEN /lpf
Epithelial Cells (non renal): >10 /hpf — AB (ref 0–10)

## 2021-01-02 LAB — LUPUS ANTICOAGULANT PANEL
Dilute Viper Venom Time: 43.5 s (ref 0.0–47.0)
PTT Lupus Anticoagulant: 38.7 s (ref 0.0–51.9)
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
## 2021-01-05 ENCOUNTER — Encounter: Payer: Self-pay | Admitting: *Deleted

## 2021-01-05 ENCOUNTER — Other Ambulatory Visit: Payer: Self-pay | Admitting: *Deleted

## 2021-01-05 ENCOUNTER — Ambulatory Visit: Payer: Medicaid Other

## 2021-01-05 DIAGNOSIS — R8271 Bacteriuria: Secondary | ICD-10-CM

## 2021-01-05 DIAGNOSIS — N289 Disorder of kidney and ureter, unspecified: Secondary | ICD-10-CM

## 2021-01-05 NOTE — Progress Notes (Signed)
Urine culture ordered due to bacteria in urine, micro results and hx.  Reneta Niehaus,RN

## 2021-01-06 ENCOUNTER — Ambulatory Visit (INDEPENDENT_AMBULATORY_CARE_PROVIDER_SITE_OTHER): Payer: Medicaid Other

## 2021-01-06 ENCOUNTER — Other Ambulatory Visit: Payer: Self-pay

## 2021-01-06 DIAGNOSIS — R8271 Bacteriuria: Secondary | ICD-10-CM

## 2021-01-06 DIAGNOSIS — N289 Disorder of kidney and ureter, unspecified: Secondary | ICD-10-CM

## 2021-01-06 NOTE — Progress Notes (Signed)
Pt here today due to still having lower back pain x 1 month. Pt states was here on 10/13 and gave urine and no results have been reviewed by provider. Pt gave new urine sample today. Pt states does not have time for full nurse visit due to have to get to work. Pt thought she was here to just drop off new urine sample. Pt also has one kidney. Pt denies fever, urine urgency or frequency.   Pt advised will contact a provider in office today for review of urine on 10/13 and return call to pt with recommendations. Pt verbalized understanding.   Reviewed results with Dr Jolayne Panther. Pt advised to have urine today sent for culture to check for infection. Advised to increase fluids for possible kidney stones. If UC comes back negative, then referral to PCP for follow up.   Call placed to pt. Spoke with pt. Pt given results and recommendations per Dr Jolayne Panther. Pt states has hx of kidney stones in kidney that was removed 05/2020. Pt verbalized understanding for UC and increasing fluids. Pt states has PCP follow up on 10/25. Advised pt to keep appt and watch for UC results in mychart. Pt agreeable to plan of care.  Judeth Cornfield, RN

## 2021-01-06 NOTE — Progress Notes (Signed)
Patient was assessed and managed by nursing staff during this encounter. I have reviewed the chart and agree with the documentation and plan. I have also made any necessary editorial changes.  Catalina Antigua, MD 01/06/2021 11:47 AM

## 2021-01-09 LAB — URINE CULTURE

## 2021-04-14 ENCOUNTER — Other Ambulatory Visit: Payer: Self-pay | Admitting: Internal Medicine

## 2021-04-15 LAB — COMPLETE METABOLIC PANEL WITHOUT GFR
AG Ratio: 1.4 (calc) (ref 1.0–2.5)
ALT: 9 U/L (ref 6–29)
AST: 10 U/L (ref 10–30)
Albumin: 4 g/dL (ref 3.6–5.1)
Alkaline phosphatase (APISO): 53 U/L (ref 31–125)
BUN/Creatinine Ratio: 10 (calc) (ref 6–22)
BUN: 12 mg/dL (ref 7–25)
CO2: 25 mmol/L (ref 20–32)
Calcium: 9.3 mg/dL (ref 8.6–10.2)
Chloride: 107 mmol/L (ref 98–110)
Creat: 1.15 mg/dL — ABNORMAL HIGH (ref 0.50–0.97)
Globulin: 2.8 g/dL (ref 1.9–3.7)
Glucose, Bld: 84 mg/dL (ref 65–99)
Potassium: 4.2 mmol/L (ref 3.5–5.3)
Sodium: 140 mmol/L (ref 135–146)
Total Bilirubin: 0.3 mg/dL (ref 0.2–1.2)
Total Protein: 6.8 g/dL (ref 6.1–8.1)
eGFR: 63 mL/min/1.73m2

## 2021-04-15 LAB — LIPID PANEL
Cholesterol: 167 mg/dL
HDL: 54 mg/dL
LDL Cholesterol (Calc): 91 mg/dL
Non-HDL Cholesterol (Calc): 113 mg/dL
Total CHOL/HDL Ratio: 3.1 (calc)
Triglycerides: 120 mg/dL

## 2021-04-15 LAB — CBC
HCT: 37.2 % (ref 35.0–45.0)
Hemoglobin: 11.8 g/dL (ref 11.7–15.5)
MCH: 27.1 pg (ref 27.0–33.0)
MCHC: 31.7 g/dL — ABNORMAL LOW (ref 32.0–36.0)
MCV: 85.5 fL (ref 80.0–100.0)
MPV: 12.2 fL (ref 7.5–12.5)
Platelets: 282 Thousand/uL (ref 140–400)
RBC: 4.35 Million/uL (ref 3.80–5.10)
RDW: 12.9 % (ref 11.0–15.0)
WBC: 5.9 Thousand/uL (ref 3.8–10.8)

## 2021-04-15 LAB — TSH: TSH: 1.61 m[IU]/L

## 2021-04-15 LAB — VITAMIN D 25 HYDROXY (VIT D DEFICIENCY, FRACTURES): Vit D, 25-Hydroxy: 25 ng/mL — ABNORMAL LOW (ref 30–100)

## 2021-04-16 DIAGNOSIS — L089 Local infection of the skin and subcutaneous tissue, unspecified: Secondary | ICD-10-CM | POA: Insufficient documentation

## 2021-04-16 DIAGNOSIS — L658 Other specified nonscarring hair loss: Secondary | ICD-10-CM

## 2021-04-16 HISTORY — DX: Other specified nonscarring hair loss: L65.8

## 2021-04-16 HISTORY — DX: Local infection of the skin and subcutaneous tissue, unspecified: L08.9

## 2021-10-02 DIAGNOSIS — R1011 Right upper quadrant pain: Secondary | ICD-10-CM

## 2021-10-02 HISTORY — DX: Right upper quadrant pain: R10.11

## 2021-10-13 ENCOUNTER — Other Ambulatory Visit: Payer: Self-pay | Admitting: General Surgery

## 2021-10-13 ENCOUNTER — Other Ambulatory Visit (HOSPITAL_COMMUNITY): Payer: Self-pay | Admitting: General Surgery

## 2021-10-13 DIAGNOSIS — R1011 Right upper quadrant pain: Secondary | ICD-10-CM

## 2021-10-14 ENCOUNTER — Other Ambulatory Visit: Payer: Self-pay | Admitting: General Surgery

## 2021-10-14 ENCOUNTER — Other Ambulatory Visit (HOSPITAL_COMMUNITY): Payer: Self-pay | Admitting: General Surgery

## 2021-10-14 DIAGNOSIS — R1011 Right upper quadrant pain: Secondary | ICD-10-CM

## 2021-10-28 ENCOUNTER — Ambulatory Visit (HOSPITAL_COMMUNITY)
Admission: RE | Admit: 2021-10-28 | Discharge: 2021-10-28 | Disposition: A | Discharge status: Home / Self Care | Payer: Medicaid Other | Source: Ambulatory Visit | Attending: General Surgery | Admitting: General Surgery

## 2021-10-28 ENCOUNTER — Encounter (HOSPITAL_COMMUNITY)
Admission: RE | Admit: 2021-10-28 | Discharge: 2021-10-28 | Disposition: A | Discharge status: Home / Self Care | Payer: Medicaid Other | Source: Ambulatory Visit | Attending: General Surgery | Admitting: General Surgery

## 2021-10-28 ENCOUNTER — Other Ambulatory Visit (HOSPITAL_COMMUNITY): Payer: Self-pay | Admitting: General Surgery

## 2021-10-28 DIAGNOSIS — R1011 Right upper quadrant pain: Secondary | ICD-10-CM | POA: Diagnosis present

## 2021-10-28 MED ORDER — TECHNETIUM TC 99M MEBROFENIN IV KIT
5.3000 | PACK | Freq: Once | INTRAVENOUS | Status: AC | PRN
  Administered 2021-10-28: 5.3 via INTRAVENOUS

## 2022-03-27 ENCOUNTER — Ambulatory Visit (HOSPITAL_COMMUNITY)
Admission: EM | Admit: 2022-03-27 | Discharge: 2022-03-27 | Disposition: A | Discharge status: Home / Self Care | Payer: Medicaid Other | Attending: Internal Medicine | Admitting: Internal Medicine

## 2022-03-27 ENCOUNTER — Encounter (HOSPITAL_COMMUNITY): Payer: Self-pay | Admitting: *Deleted

## 2022-03-27 ENCOUNTER — Other Ambulatory Visit: Payer: Self-pay

## 2022-03-27 DIAGNOSIS — H9201 Otalgia, right ear: Secondary | ICD-10-CM | POA: Diagnosis not present

## 2022-03-27 DIAGNOSIS — M26609 Unspecified temporomandibular joint disorder, unspecified side: Secondary | ICD-10-CM

## 2022-03-27 MED ORDER — BACLOFEN 10 MG PO TABS: 10.0000 mg | ORAL_TABLET | Freq: Three times a day (TID) | ORAL | 0 refills | Status: AC

## 2022-03-27 NOTE — Discharge Instructions (Addendum)
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 Take baclofen muscle relaxer every 8 hours as needed for pain to the right ear/jaw. Do not take baclofen and drink alcohol or drive as it can cause you to be drowsy/sleepy. Apply warm compresses to the area intermittently 20 minutes on 20 minutes off.  If you develop any new or worsening symptoms or do not improve in the next 2 to 3 days, please return.  If your symptoms are severe, please go to the emergency room.  Follow-up with your primary care provider for further evaluation and management of your symptoms as well as ongoing wellness visits.  I hope you feel better!

## 2022-03-27 NOTE — ED Triage Notes (Signed)
Pt reports Rt ear pain started to hurt last night . Pt 's ear started to to hurt after going to her child's basket ball game.
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
## 2022-04-27 ENCOUNTER — Other Ambulatory Visit: Payer: Self-pay | Admitting: Internal Medicine

## 2022-04-28 LAB — COMPLETE METABOLIC PANEL WITH GFR
AG Ratio: 1.5 (calc) (ref 1.0–2.5)
ALT: 10 U/L (ref 6–29)
AST: 17 U/L (ref 10–30)
Albumin: 4.5 g/dL (ref 3.6–5.1)
Alkaline phosphatase (APISO): 61 U/L (ref 31–125)
BUN/Creatinine Ratio: 9 (calc) (ref 6–22)
BUN: 10 mg/dL (ref 7–25)
CO2: 19 mmol/L — ABNORMAL LOW (ref 20–32)
Calcium: 9.6 mg/dL (ref 8.6–10.2)
Chloride: 109 mmol/L (ref 98–110)
Creat: 1.11 mg/dL — ABNORMAL HIGH (ref 0.50–0.97)
Globulin: 3.1 g/dL (calc) (ref 1.9–3.7)
Glucose, Bld: 66 mg/dL (ref 65–99)
Potassium: 4.5 mmol/L (ref 3.5–5.3)
Sodium: 143 mmol/L (ref 135–146)
Total Bilirubin: 0.5 mg/dL (ref 0.2–1.2)
Total Protein: 7.6 g/dL (ref 6.1–8.1)
eGFR: 66 mL/min/{1.73_m2} (ref >=60)

## 2022-04-28 LAB — LIPID PANEL
Cholesterol: 191 mg/dL (ref <200)
HDL: 65 mg/dL (ref >=50)
LDL Cholesterol (Calc): 107 mg/dL (calc) — ABNORMAL HIGH
Non-HDL Cholesterol (Calc): 126 mg/dL (calc) (ref <130)
Total CHOL/HDL Ratio: 2.9 (calc) (ref <5.0)
Triglycerides: 101 mg/dL (ref <150)

## 2022-04-28 LAB — INFLUENZA A AND B AG, IMMUNOASSAY
INFLUENZA A ANTIGEN: NOT DETECTED
INFLUENZA B ANTIGEN: NOT DETECTED
MICRO NUMBER:: 14530504
SPECIMEN QUALITY:: ADEQUATE

## 2022-04-28 LAB — CBC
HCT: 36.5 % (ref 35.0–45.0)
Hemoglobin: 12.1 g/dL (ref 11.7–15.5)
MCH: 28.5 pg (ref 27.0–33.0)
MCHC: 33.2 g/dL (ref 32.0–36.0)
MCV: 85.9 fL (ref 80.0–100.0)
MPV: 12 fL (ref 7.5–12.5)
Platelets: 297 10*3/uL (ref 140–400)
RBC: 4.25 10*6/uL (ref 3.80–5.10)
RDW: 12.9 % (ref 11.0–15.0)
WBC: 7.1 10*3/uL (ref 3.8–10.8)

## 2022-04-28 LAB — TSH: TSH: 2.15 mIU/L

## 2022-04-28 LAB — VITAMIN D 25 HYDROXY (VIT D DEFICIENCY, FRACTURES): Vit D, 25-Hydroxy: 24 ng/mL — ABNORMAL LOW (ref 30–100)

## 2022-04-28 LAB — SARS-COV-2 RNA,(COVID-19) QUALITATIVE NAAT: SARS CoV2 RNA: NOT DETECTED

## 2022-07-28 ENCOUNTER — Telehealth: Payer: Self-pay | Admitting: Family Medicine

## 2022-07-28 NOTE — Telephone Encounter (Signed)
Patient has questions about her IUD

## 2022-07-30 ENCOUNTER — Telehealth: Payer: Self-pay

## 2022-07-30 NOTE — Telephone Encounter (Signed)
Called Pt regarding her question about her IUD, She states at her her last visit that her IUD expiration had been extended to 8 years but for some reason she questioned that, so I advised on Lilettas website it advised for Birth Control up to 8 yrs and bleeding 5 yrs. So advised will need to get replaced by 12/18/2023. Pt verbalized understanding.

## 2022-08-31 ENCOUNTER — Other Ambulatory Visit: Payer: Self-pay

## 2022-08-31 ENCOUNTER — Ambulatory Visit (INDEPENDENT_AMBULATORY_CARE_PROVIDER_SITE_OTHER): Payer: Medicaid Other | Admitting: Obstetrics and Gynecology

## 2022-08-31 ENCOUNTER — Other Ambulatory Visit (HOSPITAL_COMMUNITY)
Admission: RE | Admit: 2022-08-31 | Discharge: 2022-08-31 | Disposition: A | Discharge status: Home / Self Care | Payer: Medicaid Other | Source: Ambulatory Visit | Attending: Obstetrics and Gynecology | Admitting: Obstetrics and Gynecology

## 2022-08-31 ENCOUNTER — Encounter: Payer: Self-pay | Admitting: Obstetrics and Gynecology

## 2022-08-31 VITALS — BP 124/84 | HR 68 | Wt 248.5 lb

## 2022-08-31 DIAGNOSIS — Z905 Acquired absence of kidney: Secondary | ICD-10-CM

## 2022-08-31 DIAGNOSIS — Z01419 Encounter for gynecological examination (general) (routine) without abnormal findings: Secondary | ICD-10-CM | POA: Diagnosis present

## 2022-08-31 DIAGNOSIS — Z30430 Encounter for insertion of intrauterine contraceptive device: Secondary | ICD-10-CM

## 2022-08-31 DIAGNOSIS — Z30433 Encounter for removal and reinsertion of intrauterine contraceptive device: Secondary | ICD-10-CM | POA: Diagnosis not present

## 2022-08-31 DIAGNOSIS — N289 Disorder of kidney and ureter, unspecified: Secondary | ICD-10-CM | POA: Diagnosis not present

## 2022-08-31 MED ORDER — LEVONORGESTREL 20 MCG/DAY IU IUD
1.0000 | INTRAUTERINE_SYSTEM | Freq: Once | INTRAUTERINE | Status: AC
Start: 2022-08-31 — End: 2022-08-31
  Administered 2022-08-31: 1 via INTRAUTERINE

## 2022-08-31 NOTE — Progress Notes (Signed)
ANNUAL EXAM Patient name: NIEASHA MCAULAY MRN [id number]  Date of birth: Feb 12, 1986 Chief Complaint:   Gynecologic Exam  History of Present Illness:   TIMEKIA UPSON is a 37 y.o. [phone] being seen today for a routine annual exam.  Current complaints: bleeding with IUD, feels nail is splitting and last time this happened it ws her calcium   Had nexplanon before but tissue grew around it and it broke and had to have it surgically removed  Amenorrheic while on mirena or nexplanon but with lilettta has had significant bleeding and told multiple times the time for use was extended and had to wait for removal. Would like to have mirena inserted instead.   Menstrual concerns? Yes   Breast or nipple changes? No none since breast cyst removal  Contraception use? Yes liletta IUD - inserted  6 years ago  Sexually active? Yes no pain with intercourse  No LMP recorded. (Menstrual status: IUD).   The pregnancy intention screening data noted above was reviewed. Potential methods of contraception were discussed. The patient elected to proceed with No data recorded.   Last pap     Component Value Date/Time   DIAGPAP  06/26/2019 0938    - Negative for intraepithelial lesion or malignancy (NILM)   HPVHIGH Negative 06/26/2019 0938   ADEQPAP  06/26/2019 0938    Satisfactory for evaluation; transformation zone component PRESENT.     Last mammogram: n/a Last colonoscopy: n/a.      06/26/2019    9:38 AM 02/10/2016   10:09 AM 12/18/2015   11:44 AM 10/01/2015    1:01 PM  Depression screen PHQ 2/9  Decreased Interest 0 0 0 0  Down, Depressed, Hopeless 0 0 0 0  PHQ - 2 Score 0 0 0 0  Altered sleeping 0 0 0   Tired, decreased energy 0 0 0   Change in appetite 0 0 0   Feeling bad or failure about yourself  0 0 0   Trouble concentrating 0 0 0   Moving slowly or fidgety/restless 0 0 0   Suicidal thoughts 0 0 0   PHQ-9 Score 0 0 0         06/26/2019    9:39 AM 02/10/2016   10:09 AM  12/18/2015   11:44 AM 10/01/2015    1:00 PM  GAD 7 : Generalized Anxiety Score  Nervous, Anxious, on Edge 0 0 0 0  Control/stop worrying 0 0 0 0  Worry too much - different things 0 0 0 0  Trouble relaxing 0 0 0 3  Restless 0 0 0 3  Easily annoyed or irritable 0 0 0 0  Afraid - awful might happen 0 0 0 0  Total GAD 7 Score 0 0 0 6     Review of Systems:   Pertinent items are noted in HPI Denies any headaches, blurred vision, fatigue, shortness of breath, chest pain, abdominal pain, abnormal vaginal discharge/itching/odor/irritation, problems with periods, bowel movements, urination, or intercourse unless otherwise stated above. Pertinent History Reviewed:  Reviewed past medical,surgical, social and family history.  Reviewed problem list, medications and allergies. Physical Assessment:   Vitals:   08/31/22 1359  BP: 124/84  Pulse: 68  Weight: 248 lb 8 oz (112.7 kg)  Body mass index is 43.33 kg/m.        Physical Examination:   General appearance - well appearing, and in no distress  Mental status - alert, oriented to person, place, and time  Psych:  She has a normal mood and affect  Skin - warm and dry, normal color, no suspicious lesions noted  Chest - effort normal, all lung fields clear to auscultation bilaterally  Heart - normal rate and regular rhythm  Breasts - breasts appear normal, no suspicious masses, no skin or nipple changes or  axillary nodes  Abdomen - soft, nontender, nondistended, no masses or organomegaly  Pelvic -  VULVA: normal appearing vulva with no masses, tenderness or lesions   VAGINA: normal appearing vagina with normal color and discharge, no lesions   CERVIX: normal appearing cervix without discharge or lesions, no CMT  Thin prep pap is done with HR HPV cotesting  UTERUS: uterus is felt to be normal size, shape, consistency and nontender   ADNEXA: No adnexal masses or tenderness noted.  Extremities:  No swelling or varicosities noted  Chaperone  present for exam  No results found for this or any previous visit (from the past 24 hour(s)).  IUD Removal  Patient identified, informed consent performed, consent signed.  Patient was in the dorsal lithotomy position, normal external genitalia was noted.  A speculum was placed in the patient's vagina, normal discharge was noted, no lesions. The cervix was visualized, no lesions, no abnormal discharge.  The strings of the IUD were visualized, grasped and pulled using ring forceps. The IUD was removed in its entirety. . Patient tolerated the procedure well.  Proceeded with insertion.    IUD Insertion Procedure Note Patient identified, informed consent performed, consent signed.   Discussed risks of irregular bleeding, cramping, infection, malpositioning or misplacement of the IUD outside the uterus which may require further procedure such as laparoscopy. Also discussed >99% contraception efficacy, increased risk of ectopic pregnancy with failure of method.  Time out was performed.  Urine pregnancy test negative.  Speculum placed in the vagina.  Cervix visualized.  Cleaned with Betadine x 2.  Anterior cervix grasped with a single tooth tenaculum.  Paracervical block was not administered.  Mirena IUD placed per manufacturer's recommendations.  Strings trimmed to 3 cm. Tenaculum was removed, good hemostasis noted.  Patient tolerated procedure well. Sounded to 10cm, bedside ultrasound used to confirm intrauterine position.   Patient was given post-procedure instructions.  She was advised to have backup contraception for one week.  Patient was also asked to check IUD strings periodically and follow up prn for IUD check.   Assessment & Plan:  . 1. Well woman exam with routine gynecological exam - Cervical cancer screening: Discussed guidelines. Pap with HPV collected - GC/CT: accepts - Birth Control: IUD - Breast Health: Encouraged self breast awareness/SBE. Teaching provided.  - F/U 12 months and prn   - Cytology - PAP( [place]) - Cervicovaginal ancillary only( [place])  2. Encounter for insertion of mirena IUD Now s/p uncomplicated IUD removal and insertion. Will return for string check.  - levonorgestrel (MIRENA) 20 MCG/DAY IUD 1 each  3. Single kidney Reports nail splitting and previously this occurred with low calcium and requesting CMP to check electrolytes and review with PCP - Comp Met ([facility])     Orders Placed This Encounter  Procedures   Comp Met ([facility])    Meds:  Meds ordered this encounter  Medications   levonorgestrel (MIRENA) 20 MCG/DAY IUD 1 each    Follow-up: Return in about 6 weeks (around 10/12/2022).  Lorriane Shire, MD 08/31/2022 1:52 PM

## 2022-09-01 ENCOUNTER — Other Ambulatory Visit: Payer: Self-pay | Admitting: Obstetrics and Gynecology

## 2022-09-01 DIAGNOSIS — B9689 Other specified bacterial agents as the cause of diseases classified elsewhere: Secondary | ICD-10-CM

## 2022-09-01 LAB — COMPREHENSIVE METABOLIC PANEL
ALT: 11 IU/L (ref 0–32)
AST: 12 IU/L (ref 0–40)
Albumin/Globulin Ratio: 1.6
Albumin: 4.4 g/dL (ref 3.9–4.9)
Alkaline Phosphatase: 62 IU/L (ref 44–121)
BUN/Creatinine Ratio: 14 (ref 9–23)
BUN: 15 mg/dL (ref 6–20)
Bilirubin Total: 0.3 mg/dL (ref 0.0–1.2)
CO2: 21 mmol/L (ref 20–29)
Calcium: 9.6 mg/dL (ref 8.7–10.2)
Chloride: 105 mmol/L (ref 96–106)
Creatinine, Ser: 1.09 mg/dL — ABNORMAL HIGH (ref 0.57–1.00)
Globulin, Total: 2.7 g/dL (ref 1.5–4.5)
Glucose: 83 mg/dL (ref 70–99)
Potassium: 4.2 mmol/L (ref 3.5–5.2)
Sodium: 141 mmol/L (ref 134–144)
Total Protein: 7.1 g/dL (ref 6.0–8.5)
eGFR: 67 mL/min/{1.73_m2} (ref >59)

## 2022-09-01 LAB — CERVICOVAGINAL ANCILLARY ONLY
Bacterial Vaginitis (gardnerella): POSITIVE — AB
Candida Glabrata: NEGATIVE
Candida Vaginitis: NEGATIVE
Chlamydia: NEGATIVE
Comment: NEGATIVE
Comment: NEGATIVE
Comment: NEGATIVE
Comment: NEGATIVE
Comment: NEGATIVE
Comment: NORMAL
Neisseria Gonorrhea: NEGATIVE
Trichomonas: NEGATIVE

## 2022-09-01 MED ORDER — METRONIDAZOLE 0.75 % VA GEL
1.0000 | Freq: Every day | VAGINAL | 1 refills | Status: AC
Start: 2022-09-01 — End: ?

## 2022-09-03 LAB — CYTOLOGY - PAP
Comment: NEGATIVE
Diagnosis: NEGATIVE
High risk HPV: NEGATIVE

## 2022-10-19 ENCOUNTER — Ambulatory Visit: Payer: Medicaid Other | Admitting: Obstetrics and Gynecology

## 2023-04-03 ENCOUNTER — Encounter: Payer: Self-pay | Admitting: *Deleted

## 2023-04-03 ENCOUNTER — Other Ambulatory Visit: Payer: Self-pay

## 2023-04-03 ENCOUNTER — Ambulatory Visit
Admission: EM | Admit: 2023-04-03 | Discharge: 2023-04-03 | Disposition: A | Discharge status: Home / Self Care | Payer: Medicaid Other | Attending: Physician Assistant | Admitting: Physician Assistant

## 2023-04-03 DIAGNOSIS — M25561 Pain in right knee: Secondary | ICD-10-CM | POA: Diagnosis not present

## 2023-04-03 NOTE — ED Triage Notes (Signed)
 Pt has plantar fascitis in left foot. States due to that she has been limping and now having pain in her left knee. She has an injection in her foot on 03/28/22 and now sx are worse in her knee. States she can't take NSAIDS due to only having one kidney (per her PCP)

## 2023-04-03 NOTE — Discharge Instructions (Signed)
 Recommend rest, wear knee brace Can apply voltaren gel Can take Tylenol as needed Recommend follow up with orthopedics
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
## 2023-05-01 NOTE — Progress Notes (Signed)
 [facility]  Urgent Care Provider Note   Provider at bedside: 4:22 PM  History obtained from the: Patient  HISTORY   PATIENT ID: [name] is a 38 y.o. female.  CHIEF COMPLAINT: Chief Complaint  Patient presents with  . Headache    Patient states that she has a headache and mild congestion. Patient has been exposed to COVID. Patient is unsure about fever.   . Nasal Congestion     ALLERGIES: Allergies  Allergen Reactions  . Bismuth Other (See Comments) and Swelling    Reaction:  Mouth/tongue swelling   . Citrus And Derivatives Swelling  . Sage Oil Hives, Other (See Comments) and Swelling    Reaction:  Mouth/tongue swelling     PAST MEDICAL HISTORY: Past Medical History:  Diagnosis Date  . Allergy   . Eczema   . Varicella      CURRENT MEDICATIONS: Current Outpatient Medications on File Prior to Visit  Medication Sig Dispense Refill  . acetaminophen  (TYLENOL ) 325 mg tablet Take 650 mg by mouth.    . albuterol  HFA (PROVENTIL  HFA;VENTOLIN  HFA;PROAIR  HFA) 90 mcg/actuation inhaler Inhale.    . cholecalciferol (VITAMIN D3) 2,000 unit cap capsule Take 2,000 Units by mouth.    . cyclobenzaprine  (FLEXERIL ) 10 mg tablet TAKE 1 TABLET BY MOUTH TWICE DAILY AS NEEDED FOR CRAMPS. DISCONTINUE TIZANIDINE    . escitalopram (LEXAPRO) 10 mg tablet Take 10 mg by mouth every morning.    . fexofenadine  (ALLEGRA ) 60 mg tablet Take 60 mg by mouth.    . fluocinolone and shower cap 0.01 % oil Apply directly to scalp 3 times per week, 5 drops only 118.28 mL 3  . fluticasone  propionate (FLONASE ) 50 mcg/spray nasal spray 2 sprays.    . furosemide  (LASIX ) 20 mg tablet Take 20 mg by mouth Once Daily.    . gabapentin  (NEURONTIN ) 100 mg capsule Take 100 mg by mouth nightly.    . hydrOXYzine (ATARAX) 25 mg tablet TAKE 1 TABLET BY MOUTH EVERY 6 HOURS AS NEEDED FOR ITCHING    . magnesium  oxide (MAG-OX) 200 mg magnesium  tab tablet Take 400 mg by mouth.    .  minoxidiL  (LONITEN ) 2.5 mg tablet Take 1 tablet in the morning 30 tablet 5  . multivitamin (THERAGRAN) tab tablet Take 1 tablet by mouth Once Daily.    . phendimetrazine tartrate 105 mg cpER Take 1 capsule by mouth Once Daily.    . potassium chloride (KLOR-CON) 10 mEq ER tablet TAKE 1 TABLET BY MOUTH EVERY DAY WITH FUROSEMIDE     . predniSONE  (DELTASONE ) 20 mg tablet TAKE 2 TABLETS BY MOUTH EVERY DAY FOR 3 DAYS THEN TAKE 1 TABLET BY MOUTH EVERY DAY FOR 4 DAYS    . traMADoL  (ULTRAM ) 50 mg tablet Take 50 mg by mouth 3 (three) times a day as needed. (Patient not taking: Reported on 05/01/2023)    . traZODone  (DESYREL ) 150 mg tablet TAKE 1 TABLET BY MOUTH EVERY NIGHT AT BEDTIME FOR SLEEP    . baclofen  (LIORESAL ) 10 mg tablet Take 10 mg by mouth. (Patient not taking: Reported on 05/01/2023)    . doxycycline (MONODOX) 100 mg capsule Take 1 pill daily 30 minutes to 1 hour after dinner. (Patient not taking: Reported on 05/01/2023) 30 capsule 3  . tiZANidine (ZANAFLEX) 2 mg tablet TAKE 1 TABLET BY MOUTH TWICE DAILY AS NEEDED FOR MUSCLE CRAMPS (Patient not taking: Reported on 05/01/2023)     No current facility-administered medications on file  prior to visit.     ROS  All other symptoms are reviewed and are negative except those listed in HPI   HPI   [name] is a 38 y.o. female  presents to Urgent care with history of headache intermittently for the last couple of days with mild nasal congestion.  Her son was recently diagnosed with COVID with symptoms for the last couple of days.  Patient is here for testing of COVID.  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  Denies chest pain, shortness of breath significant cough, nausea or vomiting    PHYSICAL EXAM   Vitals:   05/01/23 1629  BP: 135/86  BP Location: Left arm  Patient Position: Sitting  Pulse: 82  Resp: 18  Temp: 98.2 F (36.8 C)  TempSrc: Tympanic  SpO2: 99%  Weight: 111 kg (245 lb)   Height: 1.613 m (5' 3.5)     Physical Exam Vitals and nursing note reviewed.  Constitutional:      General: She is not in acute distress.    Appearance: Normal appearance. She is not ill-appearing, toxic-appearing or diaphoretic.  HENT:     Head: Normocephalic and atraumatic.     Right Ear: Tympanic membrane, ear canal and external ear normal.     Left Ear: Tympanic membrane, ear canal and external ear normal.     Nose: No congestion.     Mouth/Throat:     Mouth: Mucous membranes are moist.  Eyes:     Conjunctiva/sclera: Conjunctivae normal.  Cardiovascular:     Rate and Rhythm: Normal rate and regular rhythm.  Pulmonary:     Effort: Pulmonary effort is normal. No respiratory distress.     Breath sounds: No wheezing or rhonchi.  Skin:    General: Skin is warm and dry.  Neurological:     Mental Status: She is alert.        RESULTS   Results for orders placed or performed in visit on 05/01/23  POC SARS-COV-2 SYMPTOMATIC (IDNOW)  Result Value Ref Range   SARS-COV-2 IDNOW Negative Negative   SARS IDNOW Information      A rapid, molecular diagnostic test on the IDNOW.  Negative results should be treated as presumptive and, if inconsistent with clinical symptoms should be confirmed with an alternative molecular assay.     ASSESSMENT/PLAN   1. Exposure to COVID-19 virus   2. Nonintractable headache, unspecified chronicity pattern, unspecified headache type   COVID and flu are negative Motrin  and Tylenol  for headache pain.    MEDICAL DECISION MAKING  Navdeep R Eyer is a 38 y.o. female  presents to Urgent care with history of headache intermittently for the last couple of days with mild nasal congestion.  On exam patient appears in no acute distress.  Vital signs are unremarkable for fever or tachycardia.  Lungs are clear bilateral no respiratory distress is noted.  Rapid swab for COVID is negative along with flu.  Patient will follow-up with PCP and return for new or  worsening symptoms at any time for further evaluation.  At this point I could not rule out tension headache versus early viral symptoms.  Patient is agreeable with this plan    UC DISPOSITION   Follow up with PCP   Hand out provided, I discussed the findings today, diagnosis/differential diagnosis, plan and red flags that require return for reevaluation with PCP,  Urgent care or EMERGENCY. Patient/representitive was agreeable to outlined  plan. Questions were answered and patient is stable for discharge.  Provider time spent in patient care today, inclusive of but not limited to clinical reassessment, review of diagnostic studies, and discharge preparation, was less than 30 minutes.  This document was created using the aid of voice recognition Scientist, clinical (histocompatibility and immunogenetics).  Electrically signed by Channing Sero FNP-C/ENP-C MSN at 4:56 PM

## 2023-11-16 ENCOUNTER — Telehealth: Payer: Self-pay | Admitting: Obstetrics and Gynecology

## 2023-11-16 NOTE — Telephone Encounter (Signed)
 Patient called to say she received her IUD in June, and last month she started bleeding and cramping. Request to speak to a nurse.

## 2023-11-18 MED ORDER — MEGESTROL ACETATE 20 MG PO TABS: 20.0000 mg | ORAL_TABLET | Freq: Every day | ORAL | 0 refills | Status: AC

## 2023-11-18 NOTE — Addendum Note (Signed)
 Addended by: MICHAE LOA CROME on: 11/18/2023 12:23 PM   Modules accepted: Orders

## 2023-11-18 NOTE — Telephone Encounter (Signed)
 Called pt and discussed her concern. She stated that she had some sharp pain about 2 weeks ago and then started spotting 12 days ago. The bleeding increased and for the last 7 days she has been saturating a pad every 2-3 hours. She also has abdominal cramps and back pain. 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 Per consult with Dr. Ilean, pt was offered 2 possible treatment options - short course of low dose Megace  or Sprintec daily for 2-3 months to regulate cycle. Pt opted for short course of Megace  which was e-prescribed. Pt will contact office if this does not help her bleeding and may then desire to try Sprintec.

## 2023-11-30 ENCOUNTER — Encounter: Payer: Self-pay | Admitting: *Deleted

## 2023-12-20 ENCOUNTER — Other Ambulatory Visit (HOSPITAL_COMMUNITY)
Admission: RE | Admit: 2023-12-20 | Discharge: 2023-12-20 | Disposition: A | Discharge status: Home / Self Care | Source: Ambulatory Visit | Attending: Family Medicine | Admitting: Family Medicine

## 2023-12-20 ENCOUNTER — Ambulatory Visit (INDEPENDENT_AMBULATORY_CARE_PROVIDER_SITE_OTHER): Admitting: Family Medicine

## 2023-12-20 ENCOUNTER — Other Ambulatory Visit: Payer: Self-pay

## 2023-12-20 VITALS — BP 131/86 | HR 73 | Wt 239.0 lb

## 2023-12-20 DIAGNOSIS — Z30431 Encounter for routine checking of intrauterine contraceptive device: Secondary | ICD-10-CM

## 2023-12-20 DIAGNOSIS — Z23 Encounter for immunization: Secondary | ICD-10-CM | POA: Diagnosis not present

## 2023-12-20 DIAGNOSIS — Z113 Encounter for screening for infections with a predominantly sexual mode of transmission: Secondary | ICD-10-CM | POA: Diagnosis present
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
## 2023-12-21 ENCOUNTER — Ambulatory Visit: Payer: Self-pay | Admitting: Family Medicine

## 2023-12-21 DIAGNOSIS — A549 Gonococcal infection, unspecified: Secondary | ICD-10-CM

## 2023-12-21 DIAGNOSIS — B3731 Acute candidiasis of vulva and vagina: Secondary | ICD-10-CM

## 2023-12-21 DIAGNOSIS — B9689 Other specified bacterial agents as the cause of diseases classified elsewhere: Secondary | ICD-10-CM

## 2023-12-21 DIAGNOSIS — A599 Trichomoniasis, unspecified: Secondary | ICD-10-CM

## 2023-12-21 LAB — CERVICOVAGINAL ANCILLARY ONLY
Bacterial Vaginitis (gardnerella): POSITIVE — AB
Candida Glabrata: NEGATIVE
Candida Vaginitis: POSITIVE — AB
Chlamydia: NEGATIVE
Comment: NEGATIVE
Comment: NEGATIVE
Comment: NEGATIVE
Comment: NEGATIVE
Comment: NEGATIVE
Comment: NORMAL
Neisseria Gonorrhea: POSITIVE — AB
Trichomonas: POSITIVE — AB

## 2023-12-21 LAB — RPR+HBSAG+HCVAB+...
HIV Screen 4th Generation wRfx: NONREACTIVE
Hep C Virus Ab: NONREACTIVE
Hepatitis B Surface Ag: NEGATIVE
RPR Ser Ql: NONREACTIVE

## 2023-12-23 MED ORDER — FLUCONAZOLE 150 MG PO TABS: 150.0000 mg | ORAL_TABLET | Freq: Once | ORAL | 0 refills | Status: AC

## 2023-12-23 MED ORDER — METRONIDAZOLE 500 MG PO TABS: 500.0000 mg | ORAL_TABLET | Freq: Two times a day (BID) | ORAL | 0 refills | Status: AC

## 2023-12-23 NOTE — Progress Notes (Signed)
 Called and spoke with patient, confirmed identity with two markers Discussed diagnosis of two STI's, gonorrhea and trichomoniasis. Discussed treatment for trich is flagyl  500 BID x7d PO, rx sent to pharmacy. Discussed she will need to come to clinic for treatment of gonorrhea with ceftriaxone  and that we will call her on Monday to coordinate that visit. Clinical staff please coordinate RN visit asap for ceftriaxone  administration. Discussed BV not a STI and also treated by course of Flagyl . Discussed candida not a STI and easily treated with diflucan, rx sent to pharmacy. Also reviewed that partner will need to seek treatment at his PCP. Also discussed TOC in 3-4 weeks and avoiding intercourse for 1-2 weeks after completing all treatments.  All questions answered.

## 2023-12-26 ENCOUNTER — Other Ambulatory Visit: Payer: Self-pay

## 2023-12-26 ENCOUNTER — Ambulatory Visit (INDEPENDENT_AMBULATORY_CARE_PROVIDER_SITE_OTHER): Admitting: *Deleted

## 2023-12-26 ENCOUNTER — Telehealth: Payer: Self-pay

## 2023-12-26 VITALS — Wt 239.0 lb

## 2023-12-26 DIAGNOSIS — A549 Gonococcal infection, unspecified: Secondary | ICD-10-CM | POA: Diagnosis not present

## 2023-12-26 MED ORDER — CEFTRIAXONE SODIUM 500 MG IJ SOLR
500.0000 mg | Freq: Once | INTRAMUSCULAR | Status: AC
  Administered 2023-12-26: 500 mg via INTRAMUSCULAR

## 2023-12-26 NOTE — Progress Notes (Signed)
 Pt was called and schedule in different encounter.   [place], RN

## 2023-12-26 NOTE — Telephone Encounter (Signed)
 RN called pt to schedule nurse visit for Rocephin  IM injection per Dr. Lola for gonorrhea treatment.  Pt agreed to nurse visit 12/26/23 at 3:40pm.   Waddell, RN

## 2023-12-26 NOTE — Progress Notes (Signed)
 [name] here for Rocephin   Injection for treatment of positive Gonorrhea result.  Injection administered without complication.    Rosina, RN

## 2024-02-20 ENCOUNTER — Ambulatory Visit

## 2024-02-20 VITALS — BP 151/96 | HR 70 | Wt 234.1 lb

## 2024-02-20 DIAGNOSIS — Z23 Encounter for immunization: Secondary | ICD-10-CM | POA: Diagnosis not present

## 2024-02-20 NOTE — Progress Notes (Signed)
 Pt here today for 2nd dose of Gardasil.  Administered in L. Deltoid.  Pt tolerated well.  Pt agreed to appt scheduled on 06/19/23 for 3rd dose.    Noreene Boreman,RN  02/20/24

## 2024-03-14 ENCOUNTER — Other Ambulatory Visit (HOSPITAL_COMMUNITY): Payer: Self-pay | Admitting: Internal Medicine

## 2024-03-14 DIAGNOSIS — R600 Localized edema: Secondary | ICD-10-CM

## 2024-04-11 ENCOUNTER — Ambulatory Visit (HOSPITAL_COMMUNITY)
Admission: RE | Admit: 2024-04-11 | Discharge: 2024-04-11 | Disposition: A | Discharge status: Home / Self Care | Source: Ambulatory Visit | Attending: Vascular Surgery | Admitting: Vascular Surgery

## 2024-04-11 DIAGNOSIS — R600 Localized edema: Secondary | ICD-10-CM | POA: Diagnosis present

## 2024-04-11 LAB — ECHOCARDIOGRAM COMPLETE
AR max vel: 2.47 cm2
AV Area VTI: 2.64 cm2
AV Area mean vel: 2.55 cm2
AV Mean grad: 6 mmHg
AV Peak grad: 12.1 mmHg
Ao pk vel: 1.74 m/s
Area-P 1/2: 4.15 cm2
MV M vel: 1.51 m/s
MV Peak grad: 9.1 mmHg
S' Lateral: 2.99 cm

## 2024-06-18 ENCOUNTER — Ambulatory Visit
